# Patient Record
Sex: Male | Born: 1970 | Race: White | Hispanic: No | Marital: Married | Smoking: Former smoker
Health system: Southern US, Community
[De-identification: ages and names within clinical notes are randomized; demographics above are authoritative.]

## PROBLEM LIST (undated history)

## (undated) DIAGNOSIS — R5383 Other fatigue: Secondary | ICD-10-CM

## (undated) DIAGNOSIS — N451 Epididymitis: Secondary | ICD-10-CM

## (undated) DIAGNOSIS — M549 Dorsalgia, unspecified: Secondary | ICD-10-CM

## (undated) DIAGNOSIS — E785 Hyperlipidemia, unspecified: Secondary | ICD-10-CM

## (undated) DIAGNOSIS — IMO0001 Reserved for inherently not codable concepts without codable children: Secondary | ICD-10-CM

## (undated) DIAGNOSIS — J329 Chronic sinusitis, unspecified: Secondary | ICD-10-CM

## (undated) DIAGNOSIS — I739 Peripheral vascular disease, unspecified: Secondary | ICD-10-CM

## (undated) DIAGNOSIS — G894 Chronic pain syndrome: Secondary | ICD-10-CM

## (undated) DIAGNOSIS — J449 Chronic obstructive pulmonary disease, unspecified: Secondary | ICD-10-CM

## (undated) DIAGNOSIS — K625 Hemorrhage of anus and rectum: Secondary | ICD-10-CM

## (undated) DIAGNOSIS — R1030 Lower abdominal pain, unspecified: Secondary | ICD-10-CM

## (undated) DIAGNOSIS — D649 Anemia, unspecified: Secondary | ICD-10-CM

## (undated) DIAGNOSIS — F17200 Nicotine dependence, unspecified, uncomplicated: Secondary | ICD-10-CM

## (undated) DIAGNOSIS — G8929 Other chronic pain: Secondary | ICD-10-CM

## (undated) DIAGNOSIS — K219 Gastro-esophageal reflux disease without esophagitis: Secondary | ICD-10-CM

## (undated) DIAGNOSIS — I509 Heart failure, unspecified: Secondary | ICD-10-CM

## (undated) DIAGNOSIS — M5416 Radiculopathy, lumbar region: Secondary | ICD-10-CM

## (undated) DIAGNOSIS — I639 Cerebral infarction, unspecified: Secondary | ICD-10-CM

## (undated) DIAGNOSIS — N433 Hydrocele, unspecified: Secondary | ICD-10-CM

## (undated) DIAGNOSIS — N50819 Testicular pain, unspecified: Secondary | ICD-10-CM

## (undated) DIAGNOSIS — N529 Male erectile dysfunction, unspecified: Secondary | ICD-10-CM

## (undated) DIAGNOSIS — I2510 Atherosclerotic heart disease of native coronary artery without angina pectoris: Secondary | ICD-10-CM

## (undated) DIAGNOSIS — R1310 Dysphagia, unspecified: Secondary | ICD-10-CM

## (undated) DIAGNOSIS — E7800 Pure hypercholesterolemia, unspecified: Secondary | ICD-10-CM

## (undated) HISTORY — DX: Gastro-esophageal reflux disease without esophagitis: K21.9

## (undated) HISTORY — DX: Hyperlipidemia, unspecified: E78.5

## (undated) HISTORY — DX: Cerebral infarction, unspecified: I63.9

## (undated) HISTORY — DX: Male erectile dysfunction, unspecified: N52.9

## (undated) HISTORY — DX: Hydrocele, unspecified: N43.3

## (undated) HISTORY — DX: Chronic pain syndrome: G89.4

## (undated) HISTORY — DX: Nicotine dependence, unspecified, uncomplicated: F17.200

## (undated) HISTORY — DX: Testicular pain, unspecified: N50.819

## (undated) HISTORY — DX: Other chronic pain: G89.29

## (undated) HISTORY — DX: Dorsalgia, unspecified: M54.9

## (undated) HISTORY — DX: Other fatigue: R53.83

## (undated) HISTORY — DX: Epididymitis: N45.1

## (undated) HISTORY — DX: Chronic obstructive pulmonary disease, unspecified: J44.9

## (undated) HISTORY — DX: Lower abdominal pain, unspecified: R10.30

## (undated) HISTORY — PX: APPENDECTOMY: SHX54

## (undated) HISTORY — DX: Chronic sinusitis, unspecified: J32.9

## (undated) NOTE — *Deleted (*Deleted)
Manuel Richard ZOX:[id number] DOB: 01/21/1971 DOA: 04/17/2020  PCP: Grayland Jack, FNP   Patient coming from: ***  I have personally briefly reviewed patient's old medical records in [facility]  Chief Complaint: ***  HPI: CADEL STAIRS is a 78 y.o. male with medical history significant of   Review of Systems: A  Past Medical History:  Diagnosis Date  . Anemia   . CHF (congestive heart failure) (HCC)   . Chronic back pain   . Chronic pain syndrome   . Chronic pain syndrome   . Chronic sinusitis   . COPD (chronic obstructive pulmonary disease) (HCC)   . Coronary artery disease   . Dyslipidemia   . Dysphagia   . Epididymitis    Chlamydial, Right  . Erectile dysfunction   . Fatigue   . GERD (gastroesophageal reflux disease)   . Groin pain   . High cholesterol   . Hydrocele    bilateral  . Lumbar radiculopathy   . Nicotine addiction   . Peripheral vascular disease (HCC)   . Rectal bleeding   . Shortness of breath dyspnea   . Stroke (HCC) 2006   x 2     . Testicular pain     Past Surgical History:  Procedure Laterality Date  . APPENDECTOMY     Social History  reports that he has quit smoking. He smoked 0.00 packs per day for 31.00 years. He has never used smokeless tobacco. He reports that he does not drink alcohol and does not use drugs.  No Known Allergies  Family History  Problem Relation Age of Onset  . Cancer Mother   . Cancer Father   . Colon cancer Neg Hx   . Colon polyps Neg Hx    Prior to Admission medications   Medication Sig Start Date End Date Taking? Authorizing Provider  albuterol (PROVENTIL HFA;VENTOLIN HFA) 108 (90 Base) MCG/ACT inhaler Inhale 2 puffs into the lungs every 6 (six) hours as needed for wheezing or shortness of breath.   Yes [provider]  aspirin EC 81 MG tablet Take 81 mg by mouth daily.   Yes [provider]  atorvastatin (LIPITOR) 40 MG tablet Take 40 mg by mouth daily at 6 PM.  08/16/18   Yes [provider]  buPROPion (WELLBUTRIN XL) 150 MG 24 hr tablet Take 150 mg by mouth daily.   Yes [provider]  fenofibrate (TRICOR) 145 MG tablet Take 145 mg by mouth daily. 01/23/20  Yes [provider]  fluticasone (FLONASE) 50 MCG/ACT nasal spray Place 2 sprays into both nostrils daily. 02/09/20  Yes [provider]  furosemide (LASIX) 20 MG tablet Take 20 mg by mouth daily.  12/24/17  Yes [provider]  HYDROcodone-acetaminophen (NORCO) 10-325 MG tablet Take 1 tablet by mouth every 4 (four) hours as needed for pain. 07/02/19  Yes [provider]  ibuprofen (ADVIL) 800 MG tablet Take 800 mg by mouth 3 (three) times daily. 03/24/20  Yes [provider]  ipratropium-albuterol (DUONEB) 0.5-2.5 (3) MG/3ML SOLN Take 3 mLs by nebulization 3 (three) times daily.   Yes [provider]  LINZESS 290 MCG CAPS capsule Take 290 mcg by mouth daily. 05/04/19  Yes [provider]  methylPREDNISolone (MEDROL DOSEPAK) 4 MG TBPK tablet Take by mouth. 03/24/20  Yes [provider]  omeprazole (PRILOSEC) 40 MG capsule Take 40 mg by mouth daily. 01/11/20  Yes [provider]  sildenafil (REVATIO) 20 MG tablet  Take 5 tablets by mouth as needed. 02/22/18  Yes [provider]  STIOLTO RESPIMAT 2.5-2.5 MCG/ACT AERS Inhale 2 puffs into the lungs daily. 07/05/18  Yes [provider]  ezetimibe (ZETIA) 10 MG tablet Take 10 mg by mouth at bedtime. 03/17/19   [provider]  Fluticasone-Salmeterol (ADVAIR DISKUS) 250-50 MCG/DOSE AEPB Inhale 1 puff into the lungs in the morning and at bedtime. Patient not taking: Reported on April 23, 2020 10/24/19   Burgess Amor, PA-C  venlafaxine XR (EFFEXOR-XR) 75 MG 24 hr capsule Take 75 mg by mouth daily. 03/08/20   [provider]    Physical Exam: Vitals:   03/15/2020 2057 03/08/2020 2100 03/14/2020 2110 03/02/2020 2200  BP: (!) 81/58 (!) 80/48  (!) 111/53   Pulse:  (!) 115 (!) 115 (!) 109  Resp:  16 (!) 21 17  Temp:   (!) 96.6 F (35.9 C)   TempSrc:      SpO2:  97% 96% 94%  Weight:      Height:        Constitutional: NAD, calm, comfortable Eyes: PERRL, lids and conjunctivae normal ENMT: Mucous membranes are moist. Posterior pharynx clear of any exudate or lesions.Normal dentition.  Neck: normal, supple, no masses, no thyromegaly Respiratory: clear to auscultation bilaterally, no wheezing, no crackles. Normal respiratory effort. No accessory muscle use.  Cardiovascular: Regular rate and rhythm, no murmurs / rubs / gallops. No extremity edema. 2+ pedal pulses. No carotid bruits.  Abdomen: no tenderness, no masses palpated. No hepatosplenomegaly. Bowel sounds positive.  Musculoskeletal: no clubbing / cyanosis. No joint deformity upper and lower extremities. Good ROM, no contractures. Normal muscle tone.  Skin: no rashes, lesions, ulcers. No induration Neurologic: CN 2-12 grossly intact. Sensation intact, DTR normal. Strength 5/5 in all 4.  Psychiatric: Normal judgment and insight. Alert and oriented x 3. Normal mood.             CBC: Recent Labs  Lab 04/23/2020 0725 03/16/2020 0646  WBC 18.7* 15.8*  NEUTROABS 15.8* 13.1*  HGB 11.2* 12.9*  HCT 32.9* 39.2  MCV 94.3 93.8  PLT 224 260    Basic Metabolic Panel: Recent Labs  Lab 04/23/2020 0725 02/29/2020 0646  NA 136 135  K 2.8* 3.2*  CL 98 97*  CO2 27 22  GLUCOSE 137* 264*  BUN 25* 33*  CREATININE 0.60* 1.12  CALCIUM 8.5* 9.0  MG 1.5* 2.0   GFR: Estimated Creatinine Clearance: 100.2 mL/min (by C-G formula based on SCr of 1.12 mg/dL).  Liver Function Tests: Recent Labs  Lab 04-23-2020 0725 03/20/2020 0646  AST 14* 16  ALT 25 24  ALKPHOS 114 103  BILITOT 0.7 0.9  PROT 6.0* 6.0*  ALBUMIN 2.9* 2.9*   Radiological Exams on Admission: CT ABDOMEN PELVIS WO CONTRAST  Result Date: 03/21/2020 CLINICAL DATA:  Abdominal distension. Concern for obstruction. Left lower  quadrant tenderness and guarding. EXAM: CT ABDOMEN AND PELVIS WITHOUT CONTRAST TECHNIQUE: Multidetector CT imaging of the abdomen and pelvis was performed following the standard protocol without IV contrast. COMPARISON:  June 06, 2018 FINDINGS: Lower chest: There is atelectasis at versus consolidation at the lung bases, not well evaluated secondary to extensive respiratory motion artifact.The heart size is normal. Hepatobiliary: The liver is normal. The gallbladder is mildly distended. There is mild gallbladder wall thickening.There is no biliary ductal dilation. Pancreas: Normal contours without ductal dilatation. No peripancreatic fluid collection. Spleen: Unremarkable. Adrenals/Urinary Tract: --Adrenal glands: Unremarkable. --Right kidney/ureter: No hydronephrosis or radiopaque kidney stones. --Left kidney/ureter:  There are punctate nonobstructing stones in the left kidney. --Urinary bladder: Unremarkable. Stomach/Bowel: --Stomach/Duodenum: There is a questionable defect in the wall of the greater curvature of the stomach, not well evaluated secondary to respiratory motion artifact (axial series 2, image 26). --Small bowel: Unremarkable. --Colon: There are scattered colonic diverticula without CT evidence for diverticulitis. There is questionable diffuse circumferential wall thickening throughout the transverse colon. Alternatively, this may be secondary to underdistention. --Appendix: Not visualized. No right lower quadrant inflammation or free fluid. Vascular/Lymphatic: Atherosclerotic calcification is present within the non-aneurysmal abdominal aorta, without hemodynamically significant stenosis. --No retroperitoneal lymphadenopathy. --No mesenteric lymphadenopathy. --No pelvic or inguinal lymphadenopathy. Reproductive: Unremarkable Other: There is a small to moderate volume of ascites. There is a small volume free air in the upper abdomen. There are bilateral fat containing inguinal hernias.  Musculoskeletal. No acute displaced fractures. IMPRESSION: 1. Examination is significantly limited by respiratory motion artifact and lack of IV contrast. 2. Small volume free air in the upper abdomen, consistent with a perforated viscus in the absence of recent surgery. The exact site of perforation is not clearly identified on this exam, however there is a questionable defect in the wall of the greater curvature of the stomach, not well evaluated secondary to respiratory motion artifact. 3. Questionable diffuse circumferential wall thickening throughout the transverse colon. Alternatively, this may be secondary to underdistention. Correlation for signs and symptoms of colitis is recommended. 4. Nonobstructive left nephrolithiasis. 5. Atelectasis at versus consolidation at the lung bases, not well evaluated secondary to extensive respiratory motion artifact. 6. Mild gallbladder wall thickening, likely reactive. If there is high clinical suspicion for a right upper quadrant process, follow-up with ultrasound is recommended. Aortic Atherosclerosis (ICD10-I70.0). These results were called by telephone at the time of interpretation on 02/29/2020 at 3:39 pm to provider [facility] , who verbally acknowledged these results. Electronically Signed   By: Katherine Mantle M.D.   On: 03/15/2020 15:39   DG Chest 2 View  Result Date: 04/18/20 CLINICAL DATA:  Shortness of breath EXAM: CHEST - 2 VIEW COMPARISON:  03/10/2020 FINDINGS: Chronic interstitial prominence. Minimal basilar atelectasis/scarring. No pleural effusion. No pneumothorax. Stable cardiomediastinal contours. IMPRESSION: Stable chronic interstitial changes. Minimal basilar atelectasis/scarring. Electronically Signed   By: Guadlupe Spanish M.D.   On: 04-18-20 07:54   CT Angio Chest PE W and/or Wo Contrast  Result Date: 04/18/20 CLINICAL DATA:  Shortness of breath and productive cough. EXAM: CT ANGIOGRAPHY CHEST WITH CONTRAST TECHNIQUE:  Multidetector CT imaging of the chest was performed using the standard protocol during bolus administration of intravenous contrast. Multiplanar CT image reconstructions and MIPs were obtained to evaluate the vascular anatomy. CONTRAST:  75mL OMNIPAQUE IOHEXOL 350 MG/ML SOLN COMPARISON:  Chest x-ray 18-Apr-2020. CT scan of the chest Oct 17, 2018. FINDINGS: Cardiovascular: Coronary artery calcifications are identified, particularly in the left coronary arteries. The heart size is unchanged. The thoracic aorta demonstrates no aneurysm, dissection, or significant atherosclerotic change. Evaluation of the pulmonary arteries for emboli is significantly limited due to respiratory motion. In fact, the study is nondiagnostic beyond the level of the main pulmonary arteries. Within the severe limitations, no emboli definitively seen. Mediastinum/Nodes: No enlarged mediastinal, hilar, or axillary lymph nodes. Thyroid gland, trachea, and esophagus demonstrate no significant findings. Lungs/Pleura: Evaluation is significantly limited due to severe respiratory motion. Central airways are unremarkable other than some mucus in the right lower lobe bronchus. Mild paraseptal emphysema the right apex, unchanged. No pneumothorax identified. No definite focal infiltrate, nodule,  or mass. Upper Abdomen: Bilateral adrenal nodularity is stable, likely adenomas. Musculoskeletal: Focal kyphosis is seen in the upper thoracic spine at T4-5, new since Oct 17, 2018 but otherwise age indeterminate. Severe loss of height is associated with both of these vertebral bodies consistent with age-indeterminate compression fractures. Review of the MIP images confirms the above findings. IMPRESSION: 1. Evaluation for pulmonary emboli is severely limited due to significant respiratory motion. The study is nondiagnostic beyond the level of the main pulmonary arteries. No emboli seen in the main pulmonary arteries. Repeat imaging could be performed in  the patient is better able to cooperate. 2. Focal kyphosis is seen at T4-5, new since Oct 17, 2018 but otherwise age indeterminate. Severe loss of height of these vertebral bodies is consistent with age-indeterminate compression fractures. Recommend clinical correlation. 3. Coronary artery calcified atherosclerosis, particularly in the left coronary arteries. 4. Evaluation of the lungs is severely limited due to respiratory motion without focal infiltrate, nodule, or mass identified. Electronically Signed   By: Gerome Sam III M.D   On: 04-11-2020 09:23   DG CHEST PORT 1 VIEW  Result Date: 03/06/2020 CLINICAL DATA:  Respiratory failure EXAM: PORTABLE CHEST 1 VIEW COMPARISON:  2020-04-11 FINDINGS: The cardiomediastinal silhouette is stable. No pneumothorax. The lungs remain clear. No other acute abnormalities. IMPRESSION: No active disease. Electronically Signed   By: Gerome Sam III M.D   On: 03/15/2020 09:17   DG Abd Portable 1V  Result Date: 03/21/2020 CLINICAL DATA:  Shortness of breath and productive cough. EXAM: PORTABLE ABDOMEN - 1 VIEW COMPARISON:  None. FINDINGS: The bowel gas pattern is normal. No radio-opaque calculi or other significant radiographic abnormality are seen. IMPRESSION: Negative. Electronically Signed   By: Gerome Sam III M.D   On: 03/15/2020 09:17   [place] EKG SITE RITE  Result Date: 03/03/2020 If Site Rite image not attached, placement could not be confirmed due to current cardiac rhythm.   EKG: Independently reviewed.   Assessment/Plan   Bobette Mo MD [facility]  How to contact the [facility] Attending or Consulting provider 7A - 7P or covering provider during after hours 7P -7A, for this patient?   1. Check the care team in [facility] and look for a) attending/consulting [facility] provider listed and b) the [facility] team listed 2. Log into [email or web address] and use [facility]'s universal password to access. If you do not have the password, please contact  the hospital operator. 3. Locate the [facility] provider you are looking for under [facility] and page to a number that you can be directly reached. 4. If you still have difficulty reaching the provider, please page the [facility] (Director on Call) for the Hospitalists listed on amion for assistance.  03/21/2020, 10:45 PM

---

## 2004-05-27 DIAGNOSIS — I639 Cerebral infarction, unspecified: Secondary | ICD-10-CM

## 2004-05-27 HISTORY — DX: Cerebral infarction, unspecified: I63.9

## 2010-11-12 ENCOUNTER — Ambulatory Visit: Payer: Self-pay | Admitting: Internal Medicine

## 2010-12-04 ENCOUNTER — Ambulatory Visit: Payer: Self-pay | Admitting: Internal Medicine

## 2010-12-05 ENCOUNTER — Ambulatory Visit: Payer: Self-pay | Admitting: Pain Medicine

## 2014-10-07 ENCOUNTER — Other Ambulatory Visit: Payer: Self-pay | Admitting: Obstetrics and Gynecology

## 2014-10-07 ENCOUNTER — Ambulatory Visit: Payer: Medicare Other | Attending: Obstetrics and Gynecology

## 2014-10-07 DIAGNOSIS — N451 Epididymitis: Secondary | ICD-10-CM

## 2014-10-10 ENCOUNTER — Inpatient Hospital Stay: Admission: RE | Admit: 2014-10-10 | Payer: Medicare Other | Source: Ambulatory Visit

## 2014-10-10 ENCOUNTER — Ambulatory Visit
Admission: RE | Admit: 2014-10-10 | Discharge: 2014-10-10 | Disposition: A | Discharge status: Home / Self Care | Payer: Medicare Other | Source: Ambulatory Visit | Attending: Obstetrics and Gynecology | Admitting: Obstetrics and Gynecology

## 2014-10-10 ENCOUNTER — Ambulatory Visit: Admission: RE | Admit: 2014-10-10 | Payer: Medicare Other | Source: Ambulatory Visit

## 2014-10-10 ENCOUNTER — Ambulatory Visit: Payer: Medicare Other

## 2014-10-10 DIAGNOSIS — N451 Epididymitis: Secondary | ICD-10-CM | POA: Diagnosis present

## 2014-10-10 DIAGNOSIS — N433 Hydrocele, unspecified: Secondary | ICD-10-CM | POA: Insufficient documentation

## 2014-10-12 ENCOUNTER — Other Ambulatory Visit: Payer: Self-pay | Admitting: Obstetrics and Gynecology

## 2014-10-12 DIAGNOSIS — R1030 Lower abdominal pain, unspecified: Secondary | ICD-10-CM

## 2014-10-20 ENCOUNTER — Ambulatory Visit: Admission: RE | Admit: 2014-10-20 | Payer: Medicare Other | Source: Ambulatory Visit

## 2014-11-22 ENCOUNTER — Encounter: Payer: Self-pay | Admitting: *Deleted

## 2014-11-29 ENCOUNTER — Ambulatory Visit (INDEPENDENT_AMBULATORY_CARE_PROVIDER_SITE_OTHER): Payer: Self-pay | Admitting: Urology

## 2014-11-29 ENCOUNTER — Encounter: Payer: Self-pay | Admitting: Urology

## 2014-11-29 VITALS — BP 104/75 | HR 94 | Resp 18 | Ht 72.0 in | Wt 239.3 lb

## 2014-11-29 DIAGNOSIS — R3129 Other microscopic hematuria: Secondary | ICD-10-CM | POA: Insufficient documentation

## 2014-11-29 DIAGNOSIS — N451 Epididymitis: Secondary | ICD-10-CM

## 2014-11-29 DIAGNOSIS — R312 Other microscopic hematuria: Secondary | ICD-10-CM

## 2014-11-29 LAB — URINALYSIS, COMPLETE
Bilirubin, UA: NEGATIVE
Glucose, UA: NEGATIVE
Leukocytes, UA: NEGATIVE
Nitrite, UA: NEGATIVE
Specific Gravity, UA: 1.025 (ref 1.005–1.030)
Urobilinogen, Ur: 1 mg/dL (ref 0.2–1.0)
pH, UA: 5.5 (ref 5.0–7.5)

## 2014-11-29 LAB — MICROSCOPIC EXAMINATION: Bacteria, UA: NONE SEEN

## 2014-11-29 MED ORDER — DOXYCYCLINE HYCLATE 100 MG PO CAPS: 100.0000 mg | ORAL_CAPSULE | Freq: Two times a day (BID) | ORAL | Status: DC

## 2014-11-29 MED ORDER — IBUPROFEN 600 MG PO TABS: 600.0000 mg | ORAL_TABLET | Freq: Three times a day (TID) | ORAL | Status: DC | PRN

## 2014-11-29 NOTE — Progress Notes (Signed)
11/29/2014 2:40 PM   Manuel Richard 09/14/1970 [id number]  Referring provider: Ashley Marinerhristina Marie Gonzalez, Richard [address] [place], [place] [id number]  Chief Complaint  Patient presents with  . Testicle Pain    [facility] Manuel EnsignMeredith Soles Richard    HPI: Manuel Richard is a 44 year old white male who is referred to us by Dr. Jayme CloudGonzalez for right testicular pain.  Patient presented to his primary care physician 2 weeks ago complaining of right testicular pain. He was prescribed an antibiotic for epididymitis. I have the records available for me at this appointment, but the patient believes it was Levaquin. He states after his visit he was called 3 times about his condition. The first time he was told he had epididymitis, the second time he was told he had an STD and the third time he stated he was told he had phlegm in his testicles.  He states his pain has been going on for about 3 months. It is in the right testicle radiating up to the right groin. He denies any infections or injury to the scrotal area. He denies any penile discharge, but he does admit to dysuria.  He states he is not been recently sexually active.  He also denies any fever, chills, nausea, nausea or gross hematuria. Denies any pain with ejaculation.    PMH: Past Medical History  Diagnosis Date  . Epididymitis     Chlamydial, Right  . Groin pain   . Chronic sinusitis   . Dyslipidemia   . Chronic pain syndrome   . Stroke 2006    x 2     . Fatigue   . Nicotine addiction   . Chronic back pain   . Erectile dysfunction   . Hydrocele     bilateral  . Testicular pain     Surgical History: Past Surgical History  Procedure Laterality Date  . Appendectomy      Home Medications:    Medication List       This list is accurate as of: 11/29/14  2:40 PM.  Always use your most recent med list.               aspirin EC 81 MG tablet  Take 81 mg by mouth daily.     atorvastatin 40 MG  tablet  Commonly known as:  LIPITOR  Take 40 mg by mouth daily.     fluticasone 50 MCG/ACT nasal spray  Commonly known as:  FLONASE  Place 2 sprays into both nostrils daily.     ibuprofen 600 MG tablet  Commonly known as:  ADVIL,MOTRIN  Take 600 mg by mouth every 8 (eight) hours as needed.     oxyCODONE-acetaminophen 5-325 MG per tablet  Commonly known as:  PERCOCET/ROXICET  Take 1 tablet by mouth every 4 (four) hours as needed for severe pain.     traMADol 50 MG tablet  Commonly known as:  ULTRAM  Take by mouth every 6 (six) hours as needed.     vardenafil 5 MG tablet  Commonly known as:  LEVITRA  Take 5 mg by mouth as needed for erectile dysfunction.     varenicline 1 MG tablet  Commonly known as:  CHANTIX  Take 1 mg by mouth 2 (two) times daily.        Allergies: No Known Allergies  Family History: Family History  Problem Relation Age of Onset  . Cancer Mother   . Cancer Father  Social History:  reports that he has been smoking Cigarettes.  He has been smoking about 1.50 packs per day. He does not have any smokeless tobacco history on file. He reports that he does not drink alcohol. His drug history is not on file.  ROS: Urological Symptom Review  Patient is experiencing the following symptoms: Frequent urination Hard to postpone urination Burning/pain with urination Get up at night to urinate Stream starts and stops Have to strain to urinate Weak stream   Review of Systems  Gastrointestinal (upper)  : Negative for upper GI symptoms  Gastrointestinal (lower) : Negative for lower GI symptoms  Constitutional : Negative for symptoms  Skin: Negative for skin symptoms  Eyes: Negative for eye symptoms  Ear/Nose/Throat : Negative for Ear/Nose/Throat symptoms  Hematologic/Lymphatic: Negative for Hematologic/Lymphatic symptoms  Cardiovascular : Negative for cardiovascular symptoms  Respiratory : Negative for respiratory  symptoms  Endocrine: Negative for endocrine symptoms  Musculoskeletal: Negative for musculoskeletal symptoms  Neurological: Negative for neurological symptoms  Psychologic: Negative for psychiatric symptoms   Physical Exam: BP 104/75 mmHg  Pulse 94  Resp 18  Ht 6' (1.829 m)  Wt 239 lb 4.8 oz (108.546 kg)  BMI 32.45 kg/m2  Constitutional:  Alert and oriented, No acute distress. HEENT: [place] AT, moist mucus membranes.  Trachea midline, no masses. Cardiovascular: No clubbing, cyanosis, or edema. Respiratory: Normal respiratory effort, no increased work of breathing. GI: Abdomen is soft, nontender, nondistended, no abdominal masses GU: No CVA tenderness. GU: Patient with circumcised phallus.  Urethral meatus is patent.  No penile discharge. No penile lesions or rashes. Scrotum without lesions, cysts, rashes and/or edema.  Testicles are located scrotally bilaterally. No masses are appreciated in the testicles. Left epididymis is normal.  Right epididymis is tender and indurated. Rectal: Patient with  normal sphincter tone. Perineum without scarring or rashes. No rectal masses are appreciated. Prostate is approximately 45 grams, tender, not boggy, no nodules are appreciated. Seminal vesicles are normal. Skin: No rashes, bruises or suspicious lesions. Lymph: No cervical or inguinal adenopathy. Neurologic: Grossly intact, no focal deficits, moving all 4 extremities. Psychiatric: Normal mood and affect.  Laboratory Data: Results for orders placed or performed in visit on 11/29/14  Microscopic Examination  Result Value Ref Range   WBC, UA 6-10 (A) 0 -  5 /hpf   RBC, UA 3-10 (A) 0 -  2 /hpf   Epithelial Cells (non renal) 0-10 0 - 10 /hpf   Bacteria, UA None seen None seen/Few  Urinalysis, Complete  Result Value Ref Range   Specific Gravity, UA 1.025 1.005 - 1.030   pH, UA 5.5 5.0 - 7.5   Color, UA Yellow Yellow   Appearance Ur Clear Clear   Leukocytes, UA Negative Negative    Protein, UA Trace (A) Negative/Trace   Glucose, UA Negative Negative   Ketones, UA Trace (A) Negative   RBC, UA Trace (A) Negative   Bilirubin, UA Negative Negative   Urobilinogen, Ur 1.0 0.2 - 1.0 mg/dL   Nitrite, UA Negative Negative   Microscopic Examination See below:    No results found for: WBC, HGB, HCT, MCV, PLT  No results found for: CREATININE  No results found for: PSA  No results found for: TESTOSTERONE  No results found for: HGBA1C  Urinalysis No results found for: COLORURINE, APPEARANCEUR, LABSPEC, PHURINE, GLUCOSEU, HGBUR, BILIRUBINUR, KETONESUR, PROTEINUR, UROBILINOGEN, NITRITE, LEUKOCYTESUR  Pertinent Imaging: CLINICAL DATA: Right scrotal pain. Epididymitis. Symptoms for 5 days.  EXAM: SCROTAL ULTRASOUND  DOPPLER ULTRASOUND  OF THE TESTICLES  TECHNIQUE: Complete ultrasound examination of the testicles, epididymis, and other scrotal structures was performed. Color and spectral Doppler ultrasound were also utilized to evaluate blood flow to the testicles.  COMPARISON: None.  FINDINGS: Right testicle  Measurements: 4.2 x 4.7 x 4.0 cm. No mass or microlithiasis visualized.  Left testicle  Measurements: 4.9 x 3.3 x 4.2 cm. No mass or microlithiasis visualized.  Right epididymis: 2 small cysts are identified. The larger measures 0.7 cm in diameter. The right epididymis appears mildly hyperemic and swollen compared to the left.  Left epididymis: Single small cyst measuring 0.5 cm in diameter is identified.  Hydrocele: Small to moderate bilateral hydroceles are seen.  Varicocele: None visualized.  Pulsed Doppler interrogation of both testes demonstrates normal low resistance arterial and venous waveforms bilaterally.  IMPRESSION: Mild hyperemia and swelling of the right epididymis is compatible with epididymitis.  Small to moderate bilateral hydroceles.   Electronically Signed  By: Drusilla Kanner M.D.  On:  10/10/2014 10:05  Assessment & Plan:    1. Epididymitis:   Patient appears started on doxycycline. His urine is sent for GC and chlamydia cultures and urine cultures. He is not wearing any underwear at the time of exam. I advised him to wear briefs for scrotal support.  - Urinalysis, Complete - CULTURE, URINE COMPREHENSIVE  2. Microscopic hematuria:   UA is suspicious for infection.  I will send it for Cx.  We will recheck the UA upon his return in 2 weeks time to make sure micro heme resolves.     No Follow-up on file.  Michiel Cowboy, PA-C  [facility] [address], [address] [place], [place] [address] [phone]

## 2014-11-30 LAB — GC/CHLAMYDIA PROBE AMP
Chlamydia trachomatis, NAA: NEGATIVE
Neisseria gonorrhoeae by PCR: NEGATIVE

## 2014-12-01 LAB — CULTURE, URINE COMPREHENSIVE

## 2014-12-16 ENCOUNTER — Encounter: Payer: Self-pay | Admitting: Urology

## 2014-12-16 ENCOUNTER — Ambulatory Visit (INDEPENDENT_AMBULATORY_CARE_PROVIDER_SITE_OTHER): Payer: Medicare Other | Admitting: Urology

## 2014-12-16 VITALS — BP 118/81 | HR 90 | Resp 18 | Ht 72.0 in | Wt 237.4 lb

## 2014-12-16 DIAGNOSIS — F1910 Other psychoactive substance abuse, uncomplicated: Secondary | ICD-10-CM

## 2014-12-16 DIAGNOSIS — N453 Epididymo-orchitis: Secondary | ICD-10-CM

## 2014-12-16 DIAGNOSIS — R312 Other microscopic hematuria: Secondary | ICD-10-CM

## 2014-12-16 DIAGNOSIS — Z765 Malingerer [conscious simulation]: Secondary | ICD-10-CM

## 2014-12-16 DIAGNOSIS — R3129 Other microscopic hematuria: Secondary | ICD-10-CM

## 2014-12-16 LAB — URINALYSIS, COMPLETE
Bilirubin, UA: NEGATIVE
Glucose, UA: NEGATIVE
Ketones, UA: NEGATIVE
Nitrite, UA: NEGATIVE
Protein, UA: NEGATIVE
Specific Gravity, UA: 1.025 (ref 1.005–1.030)
Urobilinogen, Ur: 0.2 mg/dL (ref 0.2–1.0)
pH, UA: 5.5 (ref 5.0–7.5)

## 2014-12-16 LAB — MICROSCOPIC EXAMINATION
Bacteria, UA: NONE SEEN
Epithelial Cells (non renal): NONE SEEN /hpf (ref 0–10)

## 2014-12-16 LAB — BLADDER SCAN AMB NON-IMAGING

## 2014-12-16 MED ORDER — IBUPROFEN 600 MG PO TABS: 600.0000 mg | ORAL_TABLET | Freq: Three times a day (TID) | ORAL | Status: DC | PRN

## 2014-12-16 MED ORDER — CIPROFLOXACIN HCL 500 MG PO TABS: 500.0000 mg | ORAL_TABLET | Freq: Two times a day (BID) | ORAL | Status: DC

## 2014-12-16 NOTE — Progress Notes (Signed)
12/16/2014 10:47 AM   Manuel Richard Nov 08, 1970 [id number]  Referring provider: Ashley Mariner, MD [address] [place], [place] [address]  Chief Complaint  Patient presents with  . Follow-up    2 wk  . Epididymitis  . Hematuria    HPI: Manuel Richard is a 44 year old white male who was referred to [place] by his PCP for right testicular pain two weeks ago.  At that visit, his right epididymis was tender and he was prescribed doxycycline.  His UCx and GC/Chlamydia cultures were negative.  He was not wearing underwear at the time of the exam 2 weeks ago and advised him to wear briefs for scrotal support.  Today, he states the pain is worse. He states he felt no improvement after the doxycycline. The ibuprofen was not effective in controlling his pain and he is requesting stronger pain medication.    He has not had any fevers, chills, nausea, or vomiting. He also denies any gross hematuria, dysuria or suprapubic pain. He does state his urine just runs out.  His PVR today was 0 mL.  At his last appointment 2 weeks ago, he was found to have microscopic hematuria. Today's, UA is negative for microscopic    hematuria.   PMH: Past Medical History  Diagnosis Date  . Epididymitis     Chlamydial, Right  . Groin pain   . Chronic sinusitis   . Dyslipidemia   . Chronic pain syndrome   . Stroke 2006    x 2     . Fatigue   . Nicotine addiction   . Chronic back pain   . Erectile dysfunction   . Hydrocele     bilateral  . Testicular pain     Surgical History: Past Surgical History  Procedure Laterality Date  . Appendectomy      Home Medications:    Medication List       This list is accurate as of: 12/16/14 10:47 AM.  Always use your most recent med list.               aspirin EC 81 MG tablet  Take 81 mg by mouth daily.     atorvastatin 40 MG tablet  Commonly known as:  LIPITOR  Take 40 mg by mouth daily.     fluticasone 50 MCG/ACT nasal spray  Commonly known as:   FLONASE  Place 2 sprays into both nostrils daily.     ibuprofen 600 MG tablet  Commonly known as:  ADVIL,MOTRIN  Take 1 tablet (600 mg total) by mouth every 8 (eight) hours as needed.     vardenafil 5 MG tablet  Commonly known as:  LEVITRA  Take 5 mg by mouth as needed for erectile dysfunction.     varenicline 1 MG tablet  Commonly known as:  CHANTIX  Take 1 mg by mouth 2 (two) times daily.        Allergies: No Known Allergies  Family History: Family History  Problem Relation Age of Onset  . Cancer Mother   . Cancer Father     Social History:  reports that he has been smoking Cigarettes.  He has been smoking about 1.50 packs per day. He does not have any smokeless tobacco history on file. He reports that he does not drink alcohol. His drug history is not on file.  ROS: UROLOGY Frequent Urination?: No Hard to postpone urination?: Yes Burning/pain with urination?: No Get up at night to urinate?: Yes  Leakage of urine?: No Urine stream starts and stops?: Yes Trouble starting stream?: No Do you have to strain to urinate?: No Blood in urine?: No Urinary tract infection?: No Sexually transmitted disease?: No Injury to kidneys or bladder?: No Painful intercourse?: No Weak stream?: No Erection problems?: Yes Penile pain?: Yes  Gastrointestinal Nausea?: No Vomiting?: No Indigestion/heartburn?: No Diarrhea?: No Constipation?: No  Constitutional Fever: No Night sweats?: No Weight loss?: No Fatigue?: No  Skin Skin rash/lesions?: No Itching?: No  Eyes Blurred vision?: No Double vision?: No  Ears/Nose/Throat Sore throat?: No Sinus problems?: No  Hematologic/Lymphatic Swollen glands?: No Easy bruising?: No  Cardiovascular Leg swelling?: No Chest pain?: No  Respiratory Cough?: No Shortness of breath?: No  Endocrine Excessive thirst?: No  Musculoskeletal Back pain?: No Joint pain?: No  Neurological Headaches?: No Dizziness?:  No  Psychologic Depression?: No Anxiety?: No  Physical Exam: BP 118/81 mmHg  Pulse 90  Resp 18  Ht 6' (1.829 m)  Wt 237 lb 6.4 oz (107.684 kg)  BMI 32.19 kg/m2  GU: No CVA tenderness. GU: Patient with circumcised phallus. Urethral meatus is patent. No penile discharge. No penile lesions or rashes. Scrotum without lesions, cysts, rashes and/or edema. Testicles are located scrotally bilaterally. No masses are appreciated in the testicles. Left epididymis is normal. Right epididymis is tender. Rectal: Patient with normal sphincter tone. Perineum without scarring or rashes. No rectal masses are appreciated. Prostate is approximately 45 grams, tender, not boggy, no nodules are appreciated. Seminal vesicles are normal.  Laboratory Data: Results for orders placed or performed in visit on 12/16/14  Microscopic Examination  Result Value Ref Range   WBC, UA 0-5 0 -  5 /hpf   RBC, UA 0-2 0 -  2 /hpf   Epithelial Cells (non renal) None seen 0 - 10 /hpf   Mucus, UA Present (A) Not Estab.   Bacteria, UA None seen None seen/Few  Urinalysis, Complete  Result Value Ref Range   Specific Gravity, UA 1.025 1.005 - 1.030   pH, UA 5.5 5.0 - 7.5   Color, UA Yellow Yellow   Appearance Ur Clear Clear   Leukocytes, UA 1+ (A) Negative   Protein, UA Negative Negative/Trace   Glucose, UA Negative Negative   Ketones, UA Negative Negative   RBC, UA Trace (A) Negative   Bilirubin, UA Negative Negative   Urobilinogen, Ur 0.2 0.2 - 1.0 mg/dL   Nitrite, UA Negative Negative   Microscopic Examination See below:   BLADDER SCAN AMB NON-IMAGING  Result Value Ref Range   Scan Result 0ml      No results found for: WBC, HGB, HCT, MCV, PLT  No results found for: CREATININE  No results found for: PSA  No results found for: TESTOSTERONE  No results found for: HGBA1C  Urinalysis    Component Value Date/Time   GLUCOSEU Negative 11/29/2014 1440   BILIRUBINUR Negative 11/29/2014 1440   NITRITE  Negative 11/29/2014 1440   LEUKOCYTESUR Negative 11/29/2014 1440    Pertinent Imaging:   Assessment & Plan:    1. Epididymitis: Patient is still experiencing pain in his right epididymis. I prescribed Cipro 500 mg twice daily for 30 days and is sent a prescription to his pharmacy. I'll also repeat the scrotal ultrasound.  - Urinalysis, Complete - CULTURE, URINE COMPREHENSIVE  2. Microscopic hematuria: UA today was negative for microscopic hematuria.   We will check another UA at his scrotal ultrasound result appointment.   3.  Drug-seeking behavior:   Patient's  pain was disproportion to the exam.  During the exam, the patient was moving cautious as a person would in extreme pain.  After the visit, he had no difficulty rising from his chair to exit the room.    I observed him walking to his car and he had no signs of discomfort.    - Urinalysis, Complete - BLADDER SCAN AMB NON-IMAGING   No Follow-up on file.  Michiel Cowboy, PA-C  [facility] [address] [place], [place] [address] [phone]

## 2014-12-29 ENCOUNTER — Ambulatory Visit
Admission: RE | Admit: 2014-12-29 | Discharge: 2014-12-29 | Disposition: A | Discharge status: Home / Self Care | Payer: Medicare Other | Source: Ambulatory Visit | Attending: Urology | Admitting: Urology

## 2014-12-29 DIAGNOSIS — N503 Cyst of epididymis: Secondary | ICD-10-CM | POA: Diagnosis not present

## 2014-12-29 DIAGNOSIS — N453 Epididymo-orchitis: Secondary | ICD-10-CM

## 2014-12-29 DIAGNOSIS — N433 Hydrocele, unspecified: Secondary | ICD-10-CM | POA: Diagnosis not present

## 2014-12-29 DIAGNOSIS — N508 Other specified disorders of male genital organs: Secondary | ICD-10-CM | POA: Diagnosis present

## 2014-12-29 DIAGNOSIS — I861 Scrotal varices: Secondary | ICD-10-CM | POA: Insufficient documentation

## 2014-12-30 ENCOUNTER — Ambulatory Visit (INDEPENDENT_AMBULATORY_CARE_PROVIDER_SITE_OTHER): Payer: Medicare Other | Admitting: Urology

## 2014-12-30 ENCOUNTER — Encounter: Payer: Self-pay | Admitting: Urology

## 2014-12-30 VITALS — BP 106/73 | HR 82 | Ht 72.0 in | Wt 236.9 lb

## 2014-12-30 DIAGNOSIS — R3129 Other microscopic hematuria: Secondary | ICD-10-CM

## 2014-12-30 DIAGNOSIS — F1910 Other psychoactive substance abuse, uncomplicated: Secondary | ICD-10-CM

## 2014-12-30 DIAGNOSIS — I861 Scrotal varices: Secondary | ICD-10-CM | POA: Diagnosis not present

## 2014-12-30 DIAGNOSIS — R312 Other microscopic hematuria: Secondary | ICD-10-CM

## 2014-12-30 DIAGNOSIS — Z765 Malingerer [conscious simulation]: Secondary | ICD-10-CM

## 2014-12-30 DIAGNOSIS — N433 Hydrocele, unspecified: Secondary | ICD-10-CM | POA: Diagnosis not present

## 2014-12-30 LAB — URINALYSIS, COMPLETE
Bilirubin, UA: NEGATIVE
Glucose, UA: NEGATIVE
Ketones, UA: NEGATIVE
Nitrite, UA: NEGATIVE
Protein, UA: NEGATIVE
Specific Gravity, UA: <1.005 — ABNORMAL LOW (ref 1.005–1.030)
Urobilinogen, Ur: 0.2 mg/dL (ref 0.2–1.0)
pH, UA: 6 (ref 5.0–7.5)

## 2014-12-30 LAB — MICROSCOPIC EXAMINATION
Bacteria, UA: NONE SEEN
Epithelial Cells (non renal): NONE SEEN /hpf (ref 0–10)
Renal Epithel, UA: NONE SEEN /hpf

## 2014-12-30 NOTE — Progress Notes (Signed)
9:06 AM   Manuel Richard February 08, 1971 [id number]  Referring provider: Ashley Mariner, MD [address] [place], [place] [address]  Chief Complaint  Patient presents with  . Testicle Pain    f/u testicular ultrasound     HPI: Manuel Richard is a 44 year old white male who presents today for a scrotal ultrasound report.  Scrotal ultrasound was ordered after he was still experiencing scrotal pain after a 30 day course of Cipro.   His UCx and GC/Chlamydia cultures were negative.  He was not wearing underwear at the time of the exam 2 weeks ago and I advised him to wear briefs for scrotal support.  He is not wearing underwear again today.   Today, he states the pain is slightly diminished but still present.   He has not had any fevers, chills, nausea, or vomiting. He also denies any gross hematuria, dysuria or suprapubic pain.   Today's, UA is negative for microscopic hematuria.  His UA from his last visit on 12/16/2014 was negative for microhematuria.     PMH: Past Medical History  Diagnosis Date  . Epididymitis     Chlamydial, Right  . Groin pain   . Chronic sinusitis   . Dyslipidemia   . Chronic pain syndrome   . Stroke 2006    x 2     . Fatigue   . Nicotine addiction   . Chronic back pain   . Erectile dysfunction   . Hydrocele     bilateral  . Testicular pain     Surgical History: Past Surgical History  Procedure Laterality Date  . Appendectomy      Home Medications:    Medication List       This list is accurate as of: 12/30/14  9:06 AM.  Always use your most recent med list.               aspirin EC 81 MG tablet  Take 81 mg by mouth daily.     atorvastatin 40 MG tablet  Commonly known as:  LIPITOR  Take 40 mg by mouth daily.     ciprofloxacin 500 MG tablet  Commonly known as:  CIPRO  Take 1 tablet (500 mg total) by mouth every 12 (twelve) hours.     ferrous sulfate 325 (65 FE) MG EC tablet     fluticasone 50 MCG/ACT nasal spray  Commonly  known as:  FLONASE  Place 2 sprays into both nostrils daily.     ibuprofen 600 MG tablet  Commonly known as:  ADVIL,MOTRIN  Take 1 tablet (600 mg total) by mouth every 8 (eight) hours as needed.     vardenafil 5 MG tablet  Commonly known as:  LEVITRA  Take 5 mg by mouth as needed for erectile dysfunction.     varenicline 1 MG tablet  Commonly known as:  CHANTIX  Take 1 mg by mouth 2 (two) times daily.     Vitamin D (Ergocalciferol) 50000 UNITS Caps capsule  Commonly known as:  DRISDOL  take 1 capsule by mouth every week for 8 WEEKS.        Allergies: No Known Allergies  Family History: Family History  Problem Relation Age of Onset  . Cancer Mother   . Cancer Father     Social History:  reports that he has been smoking Cigarettes.  He has been smoking about 1.50 packs per day. He does not have any smokeless tobacco history on file. He  reports that he does not drink alcohol or use illicit drugs.  ROS: UROLOGY Frequent Urination?: No Hard to postpone urination?: Yes Burning/pain with urination?: No Get up at night to urinate?: No Leakage of urine?: Yes Urine stream starts and stops?: Yes Trouble starting stream?: No Do you have to strain to urinate?: No Blood in urine?: No Urinary tract infection?: No Sexually transmitted disease?: No Injury to kidneys or bladder?: No Painful intercourse?: No Weak stream?: No Erection problems?: No Penile pain?: No  Gastrointestinal Nausea?: No Vomiting?: No Indigestion/heartburn?: No Diarrhea?: No Constipation?: No  Constitutional Fever: No Night sweats?: No Weight loss?: No Fatigue?: No  Skin Skin rash/lesions?: No Itching?: No  Eyes Blurred vision?: No Double vision?: No  Ears/Nose/Throat Sore throat?: No Sinus problems?: No  Hematologic/Lymphatic Swollen glands?: No Easy bruising?: No  Cardiovascular Leg swelling?: No Chest pain?: No  Respiratory Cough?: No Shortness of breath?:  No  Endocrine Excessive thirst?: No  Musculoskeletal Back pain?: No Joint pain?: No  Neurological Headaches?: No Dizziness?: No  Psychologic Depression?: No Anxiety?: No  Physical Exam: BP 106/73 mmHg  Pulse 82  Ht 6' (1.829 m)  Wt 236 lb 14.4 oz (107.457 kg)  BMI 32.12 kg/m2  GU: No CVA tenderness. GU: Patient with circumcised phallus. Urethral meatus is patent. No penile discharge. No penile lesions or rashes. Scrotum without lesions, cysts, rashes and/or edema. Testicles are located scrotally bilaterally. No masses are appreciated in the testicles. Right and left epididymitis are normal.  Laboratory Data: Results for orders placed or performed in visit on 12/30/14  Microscopic Examination  Result Value Ref Range   WBC, UA 0-5 0 -  5 /hpf   RBC, UA 0-2 0 -  2 /hpf   Epithelial Cells (non renal) None seen 0 - 10 /hpf   Renal Epithel, UA None seen None seen /hpf   Bacteria, UA None seen None seen/Few  Urinalysis, Complete  Result Value Ref Range   Specific Gravity, UA <1.005 (L) 1.005 - 1.030   pH, UA 6.0 5.0 - 7.5   Color, UA Yellow Yellow   Appearance Ur Clear Clear   Leukocytes, UA Trace (A) Negative   Protein, UA Negative Negative/Trace   Glucose, UA Negative Negative   Ketones, UA Negative Negative   RBC, UA Trace (A) Negative   Bilirubin, UA Negative Negative   Urobilinogen, Ur 0.2 0.2 - 1.0 mg/dL   Nitrite, UA Negative Negative   Microscopic Examination See below:    Results for orders placed or performed in visit on 12/16/14  Microscopic Examination  Result Value Ref Range   WBC, UA 0-5 0 -  5 /hpf   RBC, UA 0-2 0 -  2 /hpf   Epithelial Cells (non renal) None seen 0 - 10 /hpf   Mucus, UA Present (A) Not Estab.   Bacteria, UA None seen None seen/Few  Urinalysis, Complete  Result Value Ref Range   Specific Gravity, UA 1.025 1.005 - 1.030   pH, UA 5.5 5.0 - 7.5   Color, UA Yellow Yellow   Appearance Ur Clear Clear   Leukocytes, UA 1+ (A)  Negative   Protein, UA Negative Negative/Trace   Glucose, UA Negative Negative   Ketones, UA Negative Negative   RBC, UA Trace (A) Negative   Bilirubin, UA Negative Negative   Urobilinogen, Ur 0.2 0.2 - 1.0 mg/dL   Nitrite, UA Negative Negative   Microscopic Examination See below:   BLADDER SCAN AMB NON-IMAGING  Result Value Ref Range  Scan Result 0ml     Pertinent Imaging: CLINICAL DATA: Orchitis. Epididymitis. Right testicular pain.  EXAM: SCROTAL ULTRASOUND  DOPPLER ULTRASOUND OF THE TESTICLES  TECHNIQUE: Complete ultrasound examination of the testicles, epididymis, and other scrotal structures was performed. Color and spectral Doppler ultrasound were also utilized to evaluate blood flow to the testicles.  COMPARISON: None.  FINDINGS: Right testicle  Measurements: 5.2 x 3.1 x 4.1 cm. No mass or microlithiasis visualized.  Left testicle  Measurements: 5 x 3.0 x 3.9 cm. No mass or microlithiasis visualized.  Right epididymis: 1.5 x 1.0 x 2.1 cm. There are 2 cysts identified, the largest measures 6 mm.  Left epididymis: Measures 1.9 x 1.0 x 2.0 cm. Cyst measures 5 mm.  Hydrocele: Bilateral hydroceles identified.  Varicocele: There are bilateral varicoceles.  Pulsed Doppler interrogation of both testes demonstrates normal low resistance arterial and venous waveforms bilaterally.  IMPRESSION: 1. No evidence for testicular mass or torsion. 2. Bilateral hydroceles. 3. Epididymal cysts. 4. Varicoceles.   Electronically Signed  By: Signa Kell M.D.  On: 12/29/2014 16:23  Assessment & Plan:    1. Bilateral hydroceles:   Patient is encouraged to manage his hydroceles conservatively. I emphasized the importance of wearing supportive underwear and not engaging in activities that cause abdominal straining and to lose weight.  He is to return to our clinic if the hydroceles become larger or interfere with life activities.  2.  Bilateral varicoceles:   Patient to manage conservatively.   - Urinalysis, Complete  2. Microscopic hematuria: UA on 11/29/2014 contained 3-10 RBC's/hpf.  UA  was negative for microscopic hematuria on 12/16/2014 and today's exam.   He is to return to [place] if he should develop gross hematuria or found to have persisting microscopic hematuria by his primary care physician.  3.  Drug-seeking behavior:   At his visit on 12/16/2014, patient's pain was disproportion to the exam.  During the exam, the patient was moving cautious as a person would in extreme pain.  After the visit, he had no difficulty rising from his chair to exit the room.    I observed him walking to his car and he had no signs of discomfort.    - Urinalysis, Complete - BLADDER SCAN AMB NON-IMAGING   No Follow-up on file.  Michiel Cowboy, PA-C  [facility] [address], [address] [place], [place] [address] [phone]

## 2015-01-12 ENCOUNTER — Encounter: Payer: Self-pay | Admitting: Urology

## 2015-01-12 ENCOUNTER — Other Ambulatory Visit: Payer: Self-pay | Admitting: Urology

## 2015-01-12 DIAGNOSIS — Z765 Malingerer [conscious simulation]: Secondary | ICD-10-CM | POA: Insufficient documentation

## 2015-01-12 DIAGNOSIS — I861 Scrotal varices: Secondary | ICD-10-CM | POA: Insufficient documentation

## 2015-01-12 DIAGNOSIS — N433 Hydrocele, unspecified: Secondary | ICD-10-CM | POA: Insufficient documentation

## 2015-01-16 ENCOUNTER — Ambulatory Visit: Payer: Medicare Other | Attending: Anesthesiology | Admitting: Anesthesiology

## 2015-01-16 ENCOUNTER — Encounter: Payer: Self-pay | Admitting: Anesthesiology

## 2015-01-16 VITALS — BP 109/64 | HR 89 | Temp 97.6°F | Resp 18 | Ht 72.0 in | Wt 235.0 lb

## 2015-01-16 DIAGNOSIS — F17200 Nicotine dependence, unspecified, uncomplicated: Secondary | ICD-10-CM

## 2015-01-16 DIAGNOSIS — M5136 Other intervertebral disc degeneration, lumbar region: Secondary | ICD-10-CM | POA: Insufficient documentation

## 2015-01-16 DIAGNOSIS — N50819 Testicular pain, unspecified: Secondary | ICD-10-CM

## 2015-01-16 DIAGNOSIS — N521 Erectile dysfunction due to diseases classified elsewhere: Secondary | ICD-10-CM

## 2015-01-16 DIAGNOSIS — M545 Low back pain, unspecified: Secondary | ICD-10-CM

## 2015-01-16 DIAGNOSIS — N451 Epididymitis: Secondary | ICD-10-CM | POA: Diagnosis not present

## 2015-01-16 DIAGNOSIS — M8929 Other disorders of bone development and growth, multiple sites: Secondary | ICD-10-CM | POA: Diagnosis not present

## 2015-01-16 DIAGNOSIS — N452 Orchitis: Secondary | ICD-10-CM | POA: Diagnosis not present

## 2015-01-16 DIAGNOSIS — E7800 Pure hypercholesterolemia, unspecified: Secondary | ICD-10-CM

## 2015-01-16 DIAGNOSIS — M51369 Other intervertebral disc degeneration, lumbar region without mention of lumbar back pain or lower extremity pain: Secondary | ICD-10-CM

## 2015-01-16 DIAGNOSIS — M5416 Radiculopathy, lumbar region: Secondary | ICD-10-CM | POA: Diagnosis not present

## 2015-01-16 DIAGNOSIS — G8929 Other chronic pain: Secondary | ICD-10-CM | POA: Diagnosis present

## 2015-01-16 MED ORDER — MELOXICAM 15 MG PO TABS: 7.5000 mg | ORAL_TABLET | Freq: Two times a day (BID) | ORAL | Status: DC

## 2015-01-16 NOTE — Progress Notes (Signed)
Safety precautions to be maintained throughout the outpatient stay will include: orient to surroundings, keep bed in low position, maintain call bell within reach at all times, provide assistance with transfer out of bed and ambulation.  

## 2015-01-16 NOTE — Progress Notes (Signed)
Subjective:    Patient ID: Manuel Richard, male    DOB: 1970-06-08, 44 y.o.   MRN: [id number] This patient is a 44 year old gentleman who came in complaining of chronic low back pain and testicular pain. 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 He indicates that the pain radiates down his anterior thigh and posterior thigh into his left calf ending in his left foot. It does not affect his toes at all As a result of the pain he is not able to ambulate. His subjective pain intensity rating is 100% Patient is demonstrated by significant pain dramatization and pain exaggeration His secondary pain is bilateral testicular pain which he describes as greater than 100%. Patient indicates that the pain started approximately 6 months ago and it was spontaneous and was not associated with any trauma. He has seen grossly with hematuria and had as also experiencing rectal bleeding. Recent is currently being evaluated for: On all prostate cancer . Pain medications   patient does not take any pain medications at this time.  The patient indicates that when he was in [place] he was given 180 Percocet tablets which controlled his pain quite well.  Other medications Patient takes that he takes aspirin 81 mg Lipitor ferrous sulfate Flonase vitamin D ergocalciferol ciprofloxacin ibuprofen Ladona Horns for and Chantix  Allergies There are no known allergies  Past medical history Past medical history is positive for chronic low back pain  chronic testicular pain with erectile dysfunction and hematuria Or cholesterolemia and is currently being investigated for cancer of the colon and cancer of the prostate.  Past surgical history She indicated that he had an appendectomy at age 51   Social and economic history  The patient indicates that he smokes approximately 1-1/2 packs of cigarettes a day and has been doing that for over 28 years He does not use alcohol now does use illicit drugs. Patient indicates that he used to be employed as a Corporate investment banker working in Programmer, systems works He is currently on full disability and does not work again.   Family history Patient is married and has been married for 12 years and has 2 children ages 32 and 49 and both children are alive and well.  His wife is 42 years old is alive and has diabetes mellitus His mother is deceased at age 74 from the complications of cancer of the stomach His father is deceased at age 71 from the complications of cancer of the stomach   he has no brothers and no sisters        HPI    Review of Systems  Constitutional: Negative.   HENT: Negative.   Eyes: Negative.   Respiratory: Negative.   Cardiovascular: Negative.   Gastrointestinal: Negative for abdominal distention.       Patient has gross rectal bleeding and is currently being investigated for cancer of the colon  Endocrine: Negative.   Genitourinary: Positive for urgency, frequency,  hematuria, decreased urine volume, scrotal swelling, penile pain and testicular pain.       This patient has severe bilateral testicular pain with which she's had for the past 6 months. This pain is associated with gross and microscopic hematuria and also severe and discouraging erectile dysfunction. He is currently being investigated for carcinoma of the prostate adenocarcinoma of the colon  Allergic/Immunologic: Negative.   Neurological: Negative.   Hematological: Negative.    Psychiatric/Behavioral: Positive for behavioral problems.       Patient did demonstrate significant pain dramatization and pain exaggeration. He pointed out that his pain was in excess of 100%. Although he was not directly drug-seeking that his implication was that the only thing that would give him relief would be pain medications since they have helped him while he was in [place]. He refused all interventions indicating that he was afraid of needles all door he had a large body piercings in the tongue in his mouth       Objective:   Physical Exam  Constitutional: He appears well-developed and well-nourished. No distress.  HENT:  Head: Normocephalic and atraumatic.  Nose: Nose normal.  Mouth/Throat: Oropharynx is clear and moist. No oropharyngeal exudate.  . Patient had a large body piercings in the midline of his tongue which appeared to affect his speech  Eyes: Conjunctivae and EOM are normal. Pupils are equal, round, and reactive to light. Right eye exhibits no discharge. Left eye exhibits no discharge. No scleral icterus.  Neck: Normal range of motion. Neck supple. No JVD present. No tracheal deviation present. No thyromegaly present.  Cardiovascular: Normal rate, regular rhythm, normal heart sounds and intact distal pulses.   His blood pressure was 109/64 mmHg His pulse was 89 bpm equal and regular His respirations were 18 breaths per minute Heart sounds 1 and 2 were heard in all areas and there were no audible murmurs His SPO2 was 98%  Pulmonary/Chest: Effort normal and breath sounds normal. No stridor. No respiratory distress. He has no wheezes. He has no rales. He exhibits no tenderness.  Abdominal: Soft. Bowel sounds are normal. He exhibits no distension and no mass. There is no tenderness. There is no rebound and no guarding.  Genitourinary:  Genitourinary examination was deferred  Musculoskeletal: He exhibits tenderness. He exhibits no edema.  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  Neurological: He is alert. He has normal reflexes. He displays normal reflexes. No cranial nerve deficit. He exhibits abnormal muscle tone. Coordination normal.  Skin: Skin is warm and dry. No rash noted. He is not diaphoretic. No erythema. No pallor.  Psychiatric: He has a normal mood and affect. His behavior is normal. Judgment and thought content normal.  Nursing note and vitals reviewed.         Assessment & Plan:   Assessment 1 chronic low back pain 2 lumbar degenerative disc disease 3Lumbar radiculopathy  4 chronic testicular pain 5 Orchitis  6 Epididymitis 7 hypercholesterolemia 8 tobacco abuse   Plan of management I plan to perform a caudal epidural steroid injection for this gentleman to review order his imaging which was done after in another institution The plan would be after evaluating the condition of  his this as they affect the neural the neural foramina that I would plan possibly a left transforaminal epidural steroid injection at L3 L4-L5 The patient refused all interventions indicating that he was afraid of needles although he had a large body piercings in his tongue I therefore prescribe meloxicam 7.5 mg twice a day after meals and give him 42 tablets plan to follow up with him in 3 weeks   New patient  level III   Tod Persia M.D.

## 2015-01-16 NOTE — Patient Instructions (Signed)
A prescription for Meloxicam was sent to your pharmacy. 

## 2015-01-20 ENCOUNTER — Encounter: Payer: Self-pay | Admitting: Urology

## 2015-01-20 ENCOUNTER — Ambulatory Visit (INDEPENDENT_AMBULATORY_CARE_PROVIDER_SITE_OTHER): Payer: Medicare Other | Admitting: Urology

## 2015-01-20 VITALS — BP 118/78 | HR 84 | Resp 18 | Ht 72.0 in | Wt 231.5 lb

## 2015-01-20 DIAGNOSIS — I861 Scrotal varices: Secondary | ICD-10-CM

## 2015-01-20 DIAGNOSIS — R3129 Other microscopic hematuria: Secondary | ICD-10-CM

## 2015-01-20 DIAGNOSIS — N433 Hydrocele, unspecified: Secondary | ICD-10-CM

## 2015-01-20 DIAGNOSIS — R312 Other microscopic hematuria: Secondary | ICD-10-CM | POA: Diagnosis not present

## 2015-01-20 LAB — MICROSCOPIC EXAMINATION
Bacteria, UA: NONE SEEN
Epithelial Cells (non renal): NONE SEEN /hpf (ref 0–10)

## 2015-01-20 LAB — URINALYSIS, COMPLETE
Bilirubin, UA: POSITIVE — AB
Glucose, UA: NEGATIVE
Leukocytes, UA: NEGATIVE
Nitrite, UA: NEGATIVE
Protein, UA: NEGATIVE
RBC, UA: NEGATIVE
Specific Gravity, UA: >1.03 — ABNORMAL HIGH (ref 1.005–1.030)
Urobilinogen, Ur: 1 mg/dL (ref 0.2–1.0)
pH, UA: 5.5 (ref 5.0–7.5)

## 2015-01-26 ENCOUNTER — Other Ambulatory Visit: Payer: Self-pay | Admitting: Anesthesiology

## 2015-01-28 NOTE — Progress Notes (Signed)
12:44 PM   Manuel Richard [birth year] [id number]  Referring provider: Ashley Mariner, MD [address] [place], [place] [address]  Chief Complaint  Patient presents with  . Follow-up    2 week.  . Hydrocele  . Hematuria    HPI: Patient is a 44 year old white male who was found to have microscopic hematuria, bilateral hydroceles and bilateral varicoceles on scrotal ultrasound who presents today for recheck of his urinalysis and scrotal symptoms.  Patient states he has not had any gross hematuria.  He has not had any difficulty with urination.  His UCx and GC/Chlamydia cultures were negative.  He is still not wearing a brief underwear as we had discussed at previous visits.   Today, he states the pain is slightly diminished but still present.   He has not had any fevers, chills, nausea, or vomiting.   Today's, UA is negative for microscopic hematuria.  His UA from his last visit on 12/16/2014 was negative for microhematuria.     PMH: Past Medical History  Diagnosis Date  . Epididymitis     Chlamydial, Right  . Groin pain   . Chronic sinusitis   . Dyslipidemia   . Chronic pain syndrome   . Stroke 2006    x 2     . Fatigue   . Nicotine addiction   . Chronic back pain   . Erectile dysfunction   . Hydrocele     bilateral  . Testicular pain     Surgical History: Past Surgical History  Procedure Laterality Date  . Appendectomy      Home Medications:    Medication List       This list is accurate as of: 01/20/15 11:59 PM.  Always use your most recent med list.               aspirin EC 81 MG tablet  Take 81 mg by mouth daily.     atorvastatin 40 MG tablet  Commonly known as:  LIPITOR  Take 40 mg by mouth daily.     ciprofloxacin 500 MG tablet  Commonly known as:  CIPRO  Take 1 tablet (500 mg total) by mouth every 12 (twelve) hours.     CRESTOR 40 MG tablet  Generic drug:  rosuvastatin     ferrous sulfate 325 (65 FE) MG EC tablet     fluticasone 50 MCG/ACT nasal spray  Commonly known as:  FLONASE  Place 2 sprays into both nostrils daily.     ibuprofen 600 MG tablet  Commonly known as:  ADVIL,MOTRIN  Take 1 tablet (600 mg total) by mouth every 8 (eight) hours as needed.     meloxicam 15 MG tablet  Commonly known as:  MOBIC  Take 0.5 tablets (7.5 mg total) by mouth 2 (two) times daily after a meal.     vardenafil 5 MG tablet  Commonly known as:  LEVITRA  Take 5 mg by mouth as needed for erectile dysfunction.     varenicline 1 MG tablet  Commonly known as:  CHANTIX  Take 1 mg by mouth 2 (two) times daily.     Vitamin D (Ergocalciferol) 50000 UNITS Caps capsule  Commonly known as:  DRISDOL  take 1 capsule by mouth every week for 8 WEEKS.        Allergies: No Known Allergies  Family History: Family History  Problem Relation Age of Onset  . Cancer Mother   . Cancer Father  Social History:  reports that he has been smoking Cigarettes.  He has been smoking about 1.50 packs per day. He does not have any smokeless tobacco history on file. He reports that he does not drink alcohol or use illicit drugs.  ROS: UROLOGY Frequent Urination?: No Hard to postpone urination?: No Burning/pain with urination?: No Get up at night to urinate?: No Leakage of urine?: No Urine stream starts and stops?: No Trouble starting stream?: No Do you have to strain to urinate?: No Blood in urine?: Yes Urinary tract infection?: No Sexually transmitted disease?: No Injury to kidneys or bladder?: No Painful intercourse?: No Weak stream?: No Erection problems?: No Penile pain?: No  Gastrointestinal Nausea?: No Vomiting?: No Indigestion/heartburn?: No Diarrhea?: No Constipation?: No  Constitutional Fever: No Night sweats?: No Weight loss?: No Fatigue?: No  Skin Skin rash/lesions?: No Itching?: No  Eyes Blurred vision?: No Double vision?: No  Ears/Nose/Throat Sore throat?: No Sinus problems?:  No  Hematologic/Lymphatic Swollen glands?: No Easy bruising?: No  Cardiovascular Leg swelling?: No Chest pain?: No  Respiratory Cough?: No Shortness of breath?: No  Endocrine Excessive thirst?: No  Musculoskeletal Back pain?: No Joint pain?: No  Neurological Headaches?: No Dizziness?: No  Psychologic Depression?: No Anxiety?: No  Physical Exam: Blood pressure 118/78, pulse 84, resp. rate 18, height 6' (1.829 m), weight 231 lb 8 oz (105.008 kg).  Laboratory Data: Results for orders placed or performed in visit on 01/20/15  Microscopic Examination  Result Value Ref Range   WBC, UA 0-5 0 -  5 /hpf   RBC, UA 0-2 0 -  2 /hpf   Epithelial Cells (non renal) None seen 0 - 10 /hpf   Crystals Present (A) N/A   Crystal Type Calcium Oxalate N/A   Mucus, UA Present (A) Not Estab.   Bacteria, UA None seen None seen/Few  Urinalysis, Complete  Result Value Ref Range   Specific Gravity, UA >1.030 (H) 1.005 - 1.030   pH, UA 5.5 5.0 - 7.5   Color, UA Yellow Yellow   Appearance Ur Clear Clear   Leukocytes, UA Negative Negative   Protein, UA Negative Negative/Trace   Glucose, UA Negative Negative   Ketones, UA 1+ (A) Negative   RBC, UA Negative Negative   Bilirubin, UA Positive (A) Negative   Urobilinogen, Ur 1.0 0.2 - 1.0 mg/dL   Nitrite, UA Negative Negative   Microscopic Examination See below:    Pertinent Imaging: CLINICAL DATA: Orchitis. Epididymitis. Right testicular pain.  EXAM: SCROTAL ULTRASOUND  DOPPLER ULTRASOUND OF THE TESTICLES  TECHNIQUE: Complete ultrasound examination of the testicles, epididymis, and other scrotal structures was performed. Color and spectral Doppler ultrasound were also utilized to evaluate blood flow to the testicles.  COMPARISON: None.  FINDINGS: Right testicle  Measurements: 5.2 x 3.1 x 4.1 cm. No mass or microlithiasis visualized.  Left testicle  Measurements: 5 x 3.0 x 3.9 cm. No mass or  microlithiasis visualized.  Right epididymis: 1.5 x 1.0 x 2.1 cm. There are 2 cysts identified, the largest measures 6 mm.  Left epididymis: Measures 1.9 x 1.0 x 2.0 cm. Cyst measures 5 mm.  Hydrocele: Bilateral hydroceles identified.  Varicocele: There are bilateral varicoceles.  Pulsed Doppler interrogation of both testes demonstrates normal low resistance arterial and venous waveforms bilaterally.  IMPRESSION: 1. No evidence for testicular mass or torsion. 2. Bilateral hydroceles. 3. Epididymal cysts. 4. Varicoceles.   Electronically Signed  By: Signa Kell M.D.  On: 12/29/2014 16:23  Assessment & Plan:  1. Bilateral hydroceles:    Patient  states he is still having scrotal pain, but he fails to engage in conservative management as we have discussed at previous visits. I have encouraged him again to invest in pairs of supportive brief underwear to help reduce the strain on the testicles.  He is to return to our clinic if the hydroceles become larger,  interfere with life activities or if conservative management fails.  2. Bilateral varicoceles:   Patient is encouraged to invest in pairs of supportive brief underwear to help reduce the strain on the scrotum.   He is to return to our clinic if the varicoceles become larger,  interfere with life activities or if conservative management fails.  2. Microscopic hematuria: UA on 11/29/2014 contained 3-10 RBC's/hpf.  UA  was negative for microscopic hematuria on 12/16/2014 and today's exam.   He is to return to [place] if he should develop gross hematuria or found to have persisting microscopic hematuria by his primary care physician.   - Urinalysis, Complete   No Follow-up on file.  Michiel Cowboy, PA-C  [facility] [address] [place], [place] [address] [phone]

## 2015-02-04 ENCOUNTER — Other Ambulatory Visit: Payer: Self-pay | Admitting: Urology

## 2015-02-10 ENCOUNTER — Encounter: Payer: Self-pay | Admitting: *Deleted

## 2015-02-10 DIAGNOSIS — R42 Dizziness and giddiness: Secondary | ICD-10-CM | POA: Insufficient documentation

## 2015-02-10 DIAGNOSIS — Z720 Tobacco use: Secondary | ICD-10-CM | POA: Diagnosis not present

## 2015-02-10 DIAGNOSIS — R531 Weakness: Secondary | ICD-10-CM | POA: Insufficient documentation

## 2015-02-10 DIAGNOSIS — N508 Other specified disorders of male genital organs: Secondary | ICD-10-CM | POA: Insufficient documentation

## 2015-02-10 NOTE — ED Notes (Signed)
Pt c/o testicular pain x 7 months. Pt c/o dizziness and weakness for same amount of time. Pt has recently been diagnosed w/ colon cancer x 1 month ago. Pt has appointment for pandoscopy in October but was told if he felt worse to come to ED. Pt has a laundry list of complaints but states his pain and swelling in his testicle is worse tonight.

## 2015-02-11 ENCOUNTER — Emergency Department
Admission: EM | Admit: 2015-02-11 | Discharge: 2015-02-11 | Discharge status: Against Medical Advice | Payer: Medicare Other | Attending: Emergency Medicine | Admitting: Emergency Medicine

## 2015-02-11 ENCOUNTER — Other Ambulatory Visit: Payer: Self-pay | Admitting: Urology

## 2015-02-15 ENCOUNTER — Ambulatory Visit: Payer: Medicare Other | Attending: Anesthesiology | Admitting: Anesthesiology

## 2015-02-15 ENCOUNTER — Encounter: Payer: Self-pay | Admitting: Anesthesiology

## 2015-02-15 VITALS — BP 126/90 | HR 80 | Temp 97.8°F | Resp 20 | Ht 72.0 in | Wt 235.0 lb

## 2015-02-15 DIAGNOSIS — G8929 Other chronic pain: Secondary | ICD-10-CM | POA: Insufficient documentation

## 2015-02-15 DIAGNOSIS — M5136 Other intervertebral disc degeneration, lumbar region: Secondary | ICD-10-CM | POA: Insufficient documentation

## 2015-02-15 DIAGNOSIS — M5137 Other intervertebral disc degeneration, lumbosacral region: Secondary | ICD-10-CM

## 2015-02-15 DIAGNOSIS — M545 Low back pain, unspecified: Secondary | ICD-10-CM

## 2015-02-15 DIAGNOSIS — M51379 Other intervertebral disc degeneration, lumbosacral region without mention of lumbar back pain or lower extremity pain: Secondary | ICD-10-CM | POA: Insufficient documentation

## 2015-02-15 DIAGNOSIS — M5416 Radiculopathy, lumbar region: Secondary | ICD-10-CM | POA: Insufficient documentation

## 2015-02-15 MED ORDER — OXYCODONE HCL ER 15 MG PO T12A: 15.0000 mg | EXTENDED_RELEASE_TABLET | Freq: Two times a day (BID) | ORAL | Status: DC

## 2015-02-15 NOTE — Progress Notes (Signed)
   Subjective:    Patient ID: Manuel Richard, male    DOB: [birth year], 44 y.o.   MRN: [id number] This gentleman returning to the clinic today indicating that he continues to have severe pain. He indicated that the meloxicam did not give him any pain relief He indicated that his subjective pain intensity rating is 100% He demonstrated pain exaggeration significant pain behavior and also pain dramatization He indicated that he's been diagnosed with colon cancer although there is no evidence of this in the record He was seen by the urologist and investigated for microscopic hematuria but nothing dramatic has been found at this time He indicates that his back pain is interfering with his quality of life He was emphatic to point out that he is to take 6 Percocets and codeines while he was in [place] and I reassured him that he would not be getting those kinds of medications in those doses from me He appeared to accept my reservation.  HPI    Review of Systems  Constitutional: Negative.   HENT: Negative.   Eyes: Negative.   Respiratory: Negative.   Cardiovascular: Negative.   Gastrointestinal: Negative.        Patient indicated that he was diagnosed with colon cancer but there is no record of this finding in his chart  Endocrine: Negative.   Genitourinary: Negative.        Patient was investigated for microscopic hematuria  Allergic/Immunologic: Negative.   Neurological: Negative.   Hematological: Negative.        Objective:   Physical Exam  Constitutional: He appears well-developed and well-nourished. No distress.  Skin: He is not diaphoretic.   physical examination was essentially normal and unchanged. His blood pressure was 126/90 mmHg His pulse was 80 breaths per minute equal and regular Heart sounds 1 and 2 were heard in all areas and there were no audible murmurs SPO2 was 100%  Chest is clinically clear and there were no adventitious sounds Abdomen was soft and nontender  has no palpable organomegaly no significant lymphadenopathy Pupils were equal and reactive  Cranial nerves were intact There were no new neurological or musculoskeletal findings other than his complaints of tenderness in the lower lumbar area        Assessment & Plan:    Assessment 1 chronic low back pain 2 lumbar degenerative disc disease 3 lumbar radiculopathy 4 possible diagnosis of colon cancer  Plan of management 1 Will discontinue meloxicam 2 Will begin the patient on OxyContin 15 mg every 12 hours and will give him 42 pills 3 Will reevaluate him in 3 weeks 4 Will perform a drug screen today and check for the results at the next visi  Established patient    level II    Tod Persia M.D. t

## 2015-02-15 NOTE — Progress Notes (Signed)
Safety precautions to be maintained throughout the outpatient stay will include: orient to surroundings, keep bed in low position, maintain call bell within reach at all times, provide assistance with transfer out of bed and ambulation.  

## 2015-02-17 ENCOUNTER — Telehealth: Payer: Self-pay | Admitting: Anesthesiology

## 2015-02-17 NOTE — Telephone Encounter (Signed)
Patient notified PA still pending.

## 2015-02-17 NOTE — Telephone Encounter (Signed)
Wants to know how long he has to wait for prior auth to be done on his meds

## 2015-02-20 ENCOUNTER — Telehealth: Payer: Self-pay | Admitting: Anesthesiology

## 2015-02-20 MED ORDER — OXYCODONE HCL ER 15 MG PO T12A: 10.0000 mg | EXTENDED_RELEASE_TABLET | Freq: Two times a day (BID) | ORAL | Status: DC

## 2015-02-20 NOTE — Telephone Encounter (Signed)
Patient's insurance denied coverage for medication prescribed / what to do now ? Please call

## 2015-02-20 NOTE — Telephone Encounter (Signed)
Pt. Informed Oxycontin denied by insurance. Will come Wed for appt to discuss other options

## 2015-02-22 ENCOUNTER — Ambulatory Visit: Payer: Medicare Other | Attending: Anesthesiology | Admitting: Anesthesiology

## 2015-02-22 ENCOUNTER — Encounter: Payer: Self-pay | Admitting: Anesthesiology

## 2015-02-22 VITALS — BP 116/91 | HR 74 | Temp 97.7°F | Resp 20 | Ht 72.0 in | Wt 234.0 lb

## 2015-02-22 DIAGNOSIS — M5137 Other intervertebral disc degeneration, lumbosacral region: Secondary | ICD-10-CM

## 2015-02-22 DIAGNOSIS — M5136 Other intervertebral disc degeneration, lumbar region: Secondary | ICD-10-CM | POA: Insufficient documentation

## 2015-02-22 DIAGNOSIS — M5416 Radiculopathy, lumbar region: Secondary | ICD-10-CM | POA: Insufficient documentation

## 2015-02-22 DIAGNOSIS — G8929 Other chronic pain: Secondary | ICD-10-CM | POA: Diagnosis present

## 2015-02-22 DIAGNOSIS — M545 Low back pain, unspecified: Secondary | ICD-10-CM

## 2015-02-22 DIAGNOSIS — Z7289 Other problems related to lifestyle: Secondary | ICD-10-CM | POA: Diagnosis not present

## 2015-02-22 DIAGNOSIS — Z765 Malingerer [conscious simulation]: Secondary | ICD-10-CM

## 2015-02-22 MED ORDER — BUPRENORPHINE 5 MCG/HR TD PTWK: 5.0000 ug | MEDICATED_PATCH | TRANSDERMAL | Status: DC

## 2015-02-22 NOTE — Progress Notes (Signed)
   Subjective:    Patient ID: Manuel Richard, male    DOB: 1971/05/19, 44 y.o.   MRN: [id number] This patient returned to the clinic today indicating that the OxyContin that I. Prescribed for him was not approved by his insurance company He has in the past demonstrated drug-seeking behavior He did not bring the old prescription with him and he said that it was left in the [facility] pharmacy I am trying to retrieve it from the pharmacy His subjective pain intensity rating today is 80% HPI    Review of Systems  Constitutional: Negative.   HENT: Negative.   Eyes: Negative.   Respiratory: Negative.   Cardiovascular: Negative.   Gastrointestinal: Negative.   Endocrine: Negative.   Genitourinary: Negative.   Musculoskeletal: Negative.   Skin: Negative.   Allergic/Immunologic: Negative.   Neurological: Negative.   Hematological: Negative.   Psychiatric/Behavioral: Negative.        Objective:   Physical Exam  Constitutional: He appears well-developed and well-nourished. No distress.  HENT:  Head: Normocephalic and atraumatic.  Right Ear: External ear normal.  Nose: Nose normal.  Mouth/Throat: Oropharynx is clear and moist. No oropharyngeal exudate.  Eyes: Conjunctivae and EOM are normal. Pupils are equal, round, and reactive to light. Left eye exhibits no discharge. No scleral icterus.  Neck: Normal range of motion. Neck supple. No JVD present. No tracheal deviation present. No thyromegaly present.  Cardiovascular: Normal rate, regular rhythm, normal heart sounds and intact distal pulses.  Exam reveals no gallop and no friction rub.   No murmur heard. Pulmonary/Chest: Effort normal and breath sounds normal. No stridor. No respiratory distress. He has no wheezes. He has no rales. He exhibits no tenderness.  Abdominal: He exhibits no distension and no mass. There is no tenderness. There is no rebound and no guarding.  Genitourinary:  Genitourinary examination was deferred   Musculoskeletal: Normal range of motion. He exhibits no edema or tenderness.  Lymphadenopathy:    He has no cervical adenopathy.  Neurological: He is alert. He has normal reflexes. He displays normal reflexes. No cranial nerve deficit. He exhibits normal muscle tone. Coordination normal.  Skin: Skin is warm and dry. No rash noted. He is not diaphoretic. No erythema. No pallor.  Psychiatric:  Patient demonstrated drug-seeking behavior          Assessment & Plan:    Assessment 1 chronic low back pain 2 lumbar degenerative disc disease 3 lumbar radiculopathy 4 drug-seeking behavior   Plan of management 1 Since the patient's insurance did not authorize OxyContin I will then try him on Butrans patch 5 g per week and will give him 4 patches 2 We will follow-up with him in 3 weeks   Established patient level III  Tod Persia M.D.

## 2015-02-22 NOTE — Progress Notes (Signed)
Safety precautions to be maintained throughout the outpatient stay will include: orient to surroundings, keep bed in low position, maintain call bell within reach at all times, provide assistance with transfer out of bed and ambulation.  

## 2015-02-22 NOTE — Patient Instructions (Signed)
You were given a prescription for Butrans Patch today

## 2015-03-01 ENCOUNTER — Encounter: Payer: Self-pay | Admitting: *Deleted

## 2015-03-02 ENCOUNTER — Encounter
Admission: RE | Disposition: A | Discharge status: Home / Self Care | Payer: Self-pay | Source: Ambulatory Visit | Attending: Gastroenterology

## 2015-03-02 ENCOUNTER — Ambulatory Visit: Payer: Medicare Other | Admitting: Registered Nurse

## 2015-03-02 ENCOUNTER — Ambulatory Visit
Admission: RE | Admit: 2015-03-02 | Discharge: 2015-03-02 | Disposition: A | Discharge status: Home / Self Care | Payer: Medicare Other | Source: Ambulatory Visit | Attending: Gastroenterology | Admitting: Gastroenterology

## 2015-03-02 DIAGNOSIS — F17210 Nicotine dependence, cigarettes, uncomplicated: Secondary | ICD-10-CM | POA: Diagnosis not present

## 2015-03-02 DIAGNOSIS — Z539 Procedure and treatment not carried out, unspecified reason: Secondary | ICD-10-CM | POA: Insufficient documentation

## 2015-03-02 DIAGNOSIS — N529 Male erectile dysfunction, unspecified: Secondary | ICD-10-CM | POA: Diagnosis not present

## 2015-03-02 DIAGNOSIS — Z8673 Personal history of transient ischemic attack (TIA), and cerebral infarction without residual deficits: Secondary | ICD-10-CM | POA: Diagnosis not present

## 2015-03-02 DIAGNOSIS — Z79899 Other long term (current) drug therapy: Secondary | ICD-10-CM | POA: Insufficient documentation

## 2015-03-02 DIAGNOSIS — A5619 Other chlamydial genitourinary infection: Secondary | ICD-10-CM | POA: Diagnosis not present

## 2015-03-02 DIAGNOSIS — N5089 Other specified disorders of the male genital organs: Secondary | ICD-10-CM | POA: Insufficient documentation

## 2015-03-02 DIAGNOSIS — R300 Dysuria: Secondary | ICD-10-CM | POA: Insufficient documentation

## 2015-03-02 DIAGNOSIS — Z7982 Long term (current) use of aspirin: Secondary | ICD-10-CM | POA: Diagnosis not present

## 2015-03-02 DIAGNOSIS — N50811 Right testicular pain: Secondary | ICD-10-CM | POA: Insufficient documentation

## 2015-03-02 DIAGNOSIS — Z7951 Long term (current) use of inhaled steroids: Secondary | ICD-10-CM | POA: Insufficient documentation

## 2015-03-02 DIAGNOSIS — E785 Hyperlipidemia, unspecified: Secondary | ICD-10-CM | POA: Diagnosis not present

## 2015-03-02 DIAGNOSIS — M545 Low back pain: Secondary | ICD-10-CM | POA: Diagnosis not present

## 2015-03-02 DIAGNOSIS — N50819 Testicular pain, unspecified: Secondary | ICD-10-CM | POA: Diagnosis present

## 2015-03-02 HISTORY — DX: Reserved for inherently not codable concepts without codable children: IMO0001

## 2015-03-02 HISTORY — DX: Anemia, unspecified: D64.9

## 2015-03-02 HISTORY — DX: Pure hypercholesterolemia, unspecified: E78.00

## 2015-03-02 SURGERY — ESOPHAGOGASTRODUODENOSCOPY (EGD) WITH PROPOFOL
Anesthesia: General

## 2015-03-02 MED ORDER — SODIUM CHLORIDE 0.9 % IV SOLN: INTRAVENOUS | Status: DC

## 2015-03-08 ENCOUNTER — Ambulatory Visit: Payer: Medicare Other | Attending: Anesthesiology | Admitting: Anesthesiology

## 2015-03-08 ENCOUNTER — Encounter: Payer: Self-pay | Admitting: Anesthesiology

## 2015-03-08 VITALS — BP 128/79 | HR 65 | Temp 98.2°F | Resp 20 | Ht 72.0 in | Wt 246.0 lb

## 2015-03-08 DIAGNOSIS — M545 Low back pain, unspecified: Secondary | ICD-10-CM

## 2015-03-08 DIAGNOSIS — M5137 Other intervertebral disc degeneration, lumbosacral region: Secondary | ICD-10-CM

## 2015-03-08 DIAGNOSIS — M5136 Other intervertebral disc degeneration, lumbar region: Secondary | ICD-10-CM | POA: Insufficient documentation

## 2015-03-08 DIAGNOSIS — G8929 Other chronic pain: Secondary | ICD-10-CM | POA: Insufficient documentation

## 2015-03-08 DIAGNOSIS — M5416 Radiculopathy, lumbar region: Secondary | ICD-10-CM | POA: Insufficient documentation

## 2015-03-08 DIAGNOSIS — Z765 Malingerer [conscious simulation]: Secondary | ICD-10-CM | POA: Diagnosis present

## 2015-03-08 MED ORDER — OXYCODONE-ACETAMINOPHEN 5-325 MG PO TABS: 1.0000 | ORAL_TABLET | Freq: Three times a day (TID) | ORAL | Status: DC | PRN

## 2015-03-08 NOTE — Progress Notes (Signed)
Safety precautions to be maintained throughout the outpatient stay will include: orient to surroundings, keep bed in low position, maintain call bell within reach at all times, provide assistance with transfer out of bed and ambulation.  

## 2015-03-08 NOTE — Progress Notes (Signed)
   Subjective:    Patient ID: Manuel Richard, male    DOB: 01-28-1971, [age] y.o.   MRN: [id number] Mr.Vollman returns to the clinic today still showing deliberately and aggressive signs of drug seeking behavior He has still refused to have any interventions and consistently asked for opioid medication I consistently tell him that I am not going to give him opioids because it is inappropriate to produce He pointed out that while he was in [place] he had large doses and I advised him that I did not feel obligated to do what his physicians in [place]. 4 I had placed him on a Butrans patch and he indicated that that was "like taken baby aspirin" He has demonstrated consistent drug-seeking behavior and in view of the fact that he is refusing to consider interventions especially as he has pathology indicating that he may benefit from interventions I would give him medications enough test to see his primary care physician He is scheduled to see his primary care physician on Friday of this week and I am therefore discharging him back to his primary care physician HPI    Review of Systems  Constitutional: Negative.   HENT: Negative.   Eyes: Negative.   Respiratory: Negative.   Cardiovascular: Negative.   Gastrointestinal: Negative.   Endocrine: Negative.   Genitourinary: Negative.   Musculoskeletal: Negative.   Allergic/Immunologic: Negative.   Neurological: Negative.   Hematological: Negative.   Psychiatric/Behavioral:       This patient has demonstrated aggressive drug-seeking behavior and is very manipulative and demanding.       Objective:   Physical Exam  Cardiovascular:  Physical examination is unremarkable and unchanged This patient does not appear to be in any significant discomfort but he shows pain dramatization and pain exaggeration all features of drug-seeking behavior His blood pressure is 128/79  His pulse is 65 bpm equal and regular Out sounds 1 and 2 were heard in all  areas There were no audible murmurs Temperature is 98.2 Chest is clinically clear  There are no adventitious sounds Abdomen is soft and nontender No palpable organomegaly No significant lymphadenopathy Pupils are equal and reactive Cranial nerves are intact There are no new neurological or musculoskeletal findings     Nursing note and vitals reviewed.         Assessment & Plan:   Assessment 1 chronic low back pain 2 lumbar degenerative disc disease 3 lumbar radiculopathy 4 drug-seeking behavior   Plan of management Since this patient refuses to consider any interventions and since she continues to be very aggressive in his drug-seeking behavior, I will give him a 10 day supply of Percocet 5/325 to be taken one 3 times a day on a when necessary basis (30 tablets) and will refer him back to his primary care physician He is therefore discharged from this clinic  Established patient  level II  Tod PersiaWinston Urho Rio M.D.

## 2015-03-13 ENCOUNTER — Ambulatory Visit
Admission: RE | Admit: 2015-03-13 | Discharge: 2015-03-13 | Disposition: A | Discharge status: Home / Self Care | Payer: Medicare Other | Source: Ambulatory Visit | Attending: Gastroenterology | Admitting: Gastroenterology

## 2015-03-13 ENCOUNTER — Encounter
Admission: RE | Disposition: A | Discharge status: Home / Self Care | Payer: Self-pay | Source: Ambulatory Visit | Attending: Gastroenterology

## 2015-03-13 DIAGNOSIS — R195 Other fecal abnormalities: Secondary | ICD-10-CM | POA: Diagnosis present

## 2015-03-13 DIAGNOSIS — Z539 Procedure and treatment not carried out, unspecified reason: Secondary | ICD-10-CM | POA: Insufficient documentation

## 2015-03-13 SURGERY — COLONOSCOPY WITH PROPOFOL
Anesthesia: General

## 2015-03-13 MED ORDER — SODIUM CHLORIDE 0.9 % IV SOLN: INTRAVENOUS | Status: DC

## 2015-03-13 NOTE — Anesthesia Preprocedure Evaluation (Deleted)
Anesthesia Evaluation    Airway       Dental   Pulmonary Current Smoker,          Cardiovascular     Neuro/Psych    GI/Hepatic   Endo/Other    Renal/GU      Musculoskeletal   Abdominal   Peds  Hematology   Anesthesia Other Findings   Reproductive/Obstetrics                           Anesthesia Physical Anesthesia Plan Anesthesia Quick Evaluation  

## 2015-05-31 DIAGNOSIS — G8929 Other chronic pain: Secondary | ICD-10-CM | POA: Insufficient documentation

## 2015-05-31 DIAGNOSIS — M545 Low back pain, unspecified: Secondary | ICD-10-CM | POA: Insufficient documentation

## 2015-06-15 ENCOUNTER — Encounter: Payer: Self-pay | Admitting: *Deleted

## 2015-06-16 ENCOUNTER — Ambulatory Visit: Admission: RE | Admit: 2015-06-16 | Payer: Medicare Other | Source: Ambulatory Visit | Admitting: Gastroenterology

## 2015-06-16 ENCOUNTER — Encounter: Admission: RE | Payer: Self-pay | Source: Ambulatory Visit

## 2015-06-16 SURGERY — COLONOSCOPY WITH PROPOFOL
Anesthesia: General

## 2016-09-30 ENCOUNTER — Other Ambulatory Visit: Payer: Self-pay | Admitting: Nurse Practitioner

## 2016-09-30 DIAGNOSIS — N50819 Testicular pain, unspecified: Secondary | ICD-10-CM

## 2016-10-23 ENCOUNTER — Ambulatory Visit: Payer: Medicare Other

## 2017-03-15 IMAGING — US US ART/VEN ABD/PELV/SCROTUM DOPPLER LTD
1 series · 14 of 25 positions shown · non-contrast
Comparison: None.

CLINICAL DATA: Orchitis.  Epididymitis.  Right testicular pain.

EXAM:
SCROTAL ULTRASOUND
DOPPLER ULTRASOUND OF THE TESTICLES
TECHNIQUE: Complete ultrasound examination of the testicles, epididymis, and
other scrotal structures was performed. Color and spectral Doppler
ultrasound were also utilized to evaluate blood flow to the
testicles.

[Series 1: us art/ven abd/pelv/scrotum doppler ltd · 0.09mm/px · 14 of 92 slices shown]
[im 1/92]
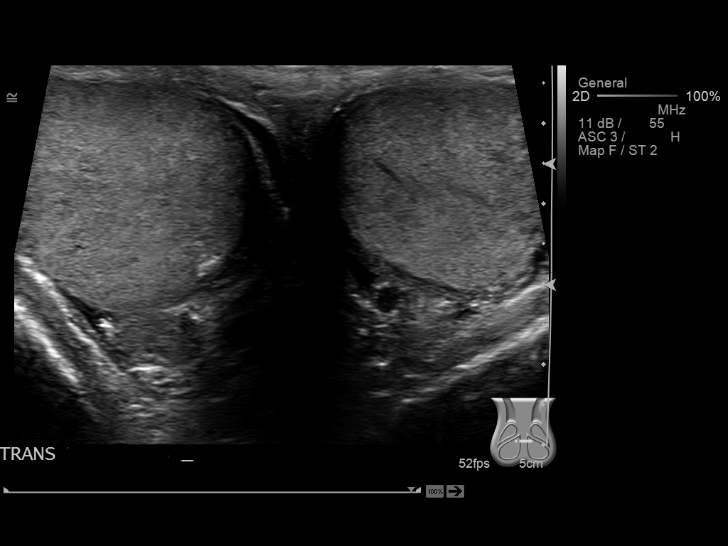
[im 8/92]
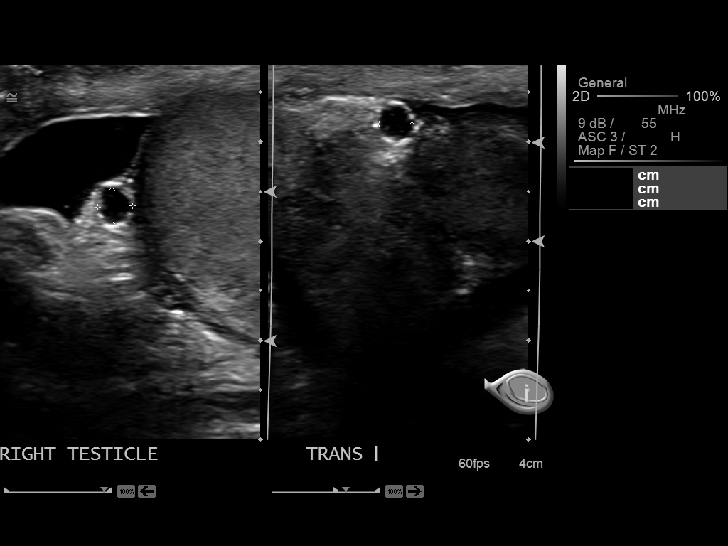
[im 16/92]
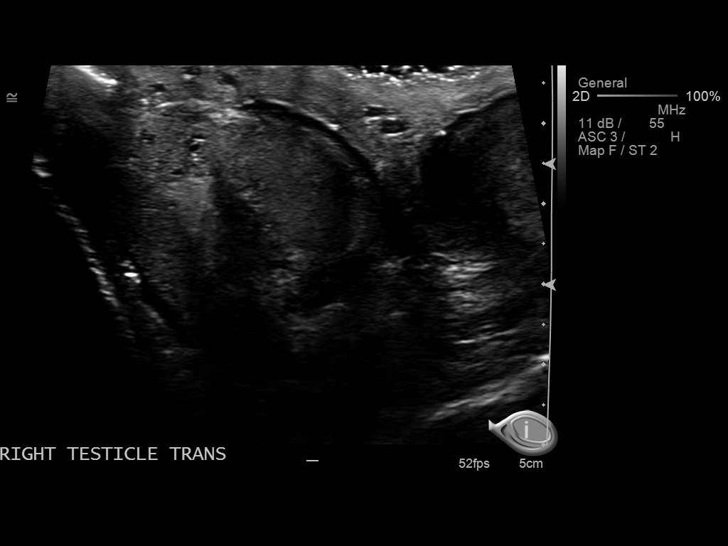
[im 23/92]
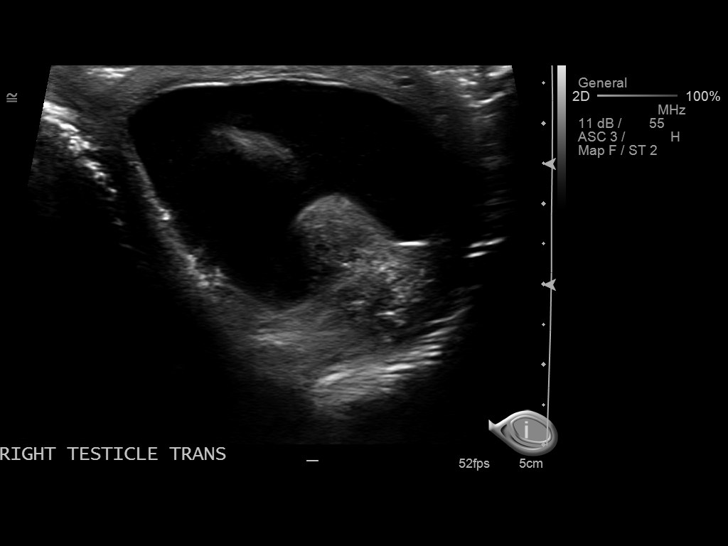
[im 31/92]
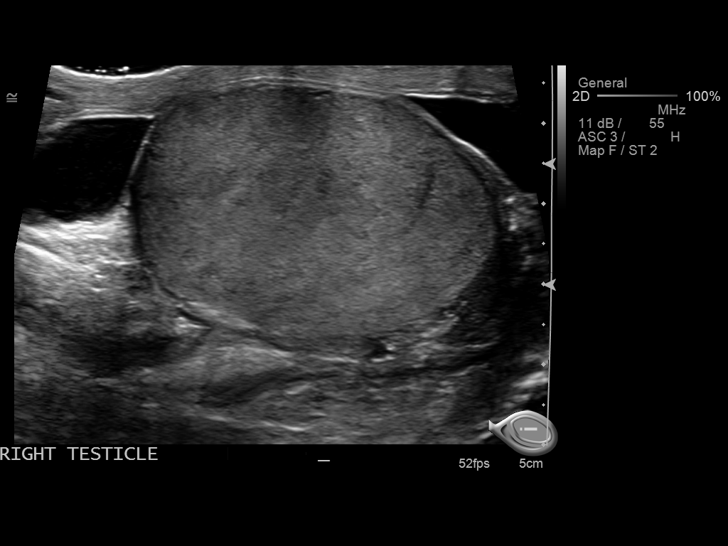
[im 35/92]
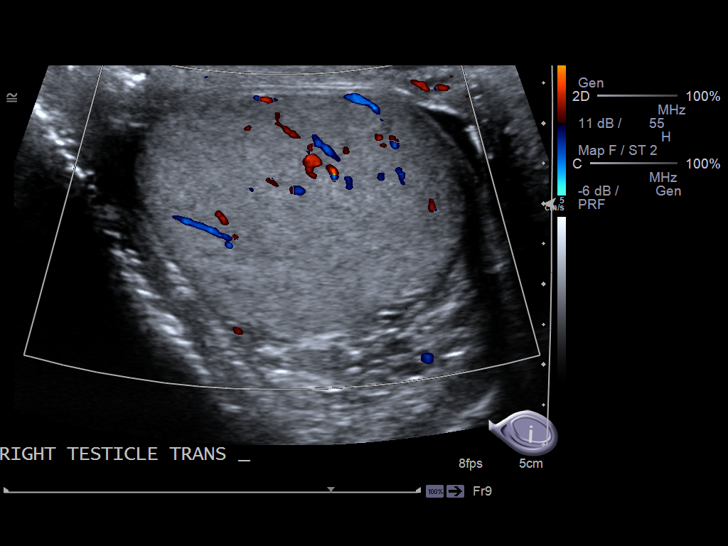
[im 42/92]
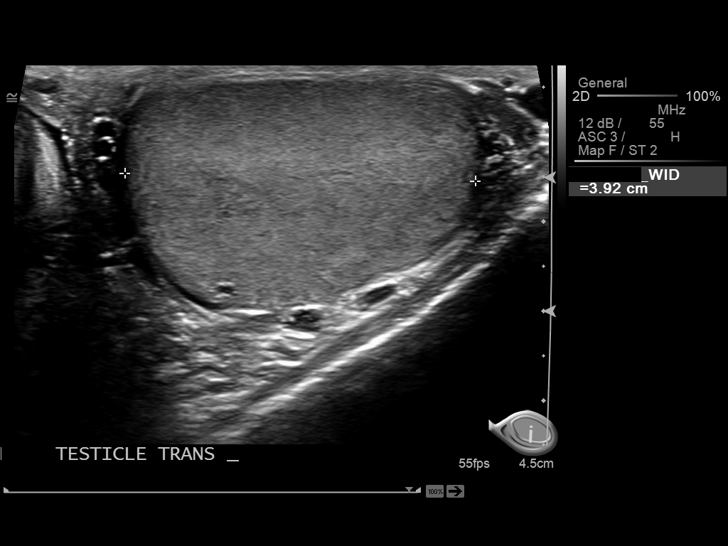
[im 50/92]
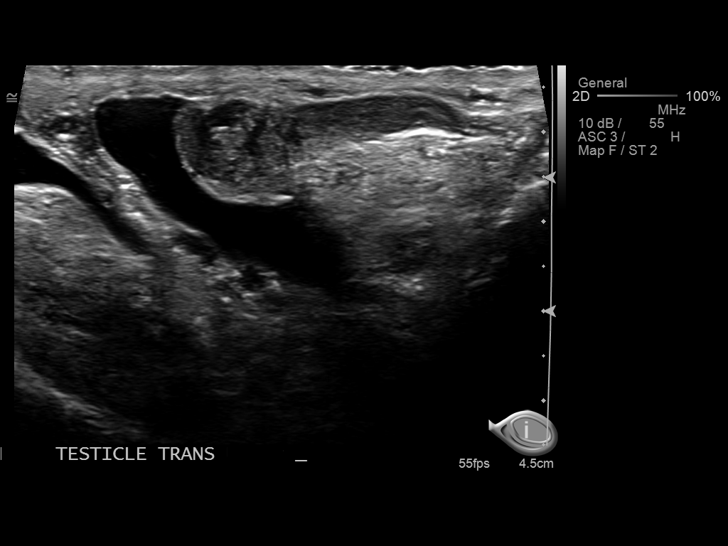
[im 57/92]
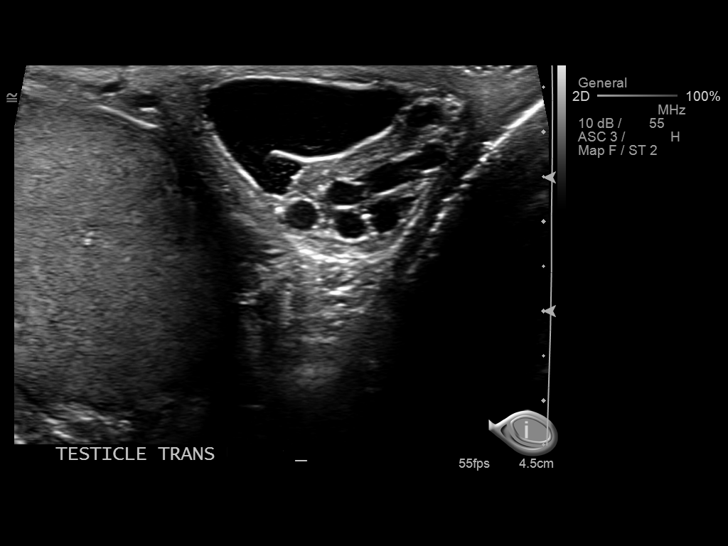
[im 61/92]
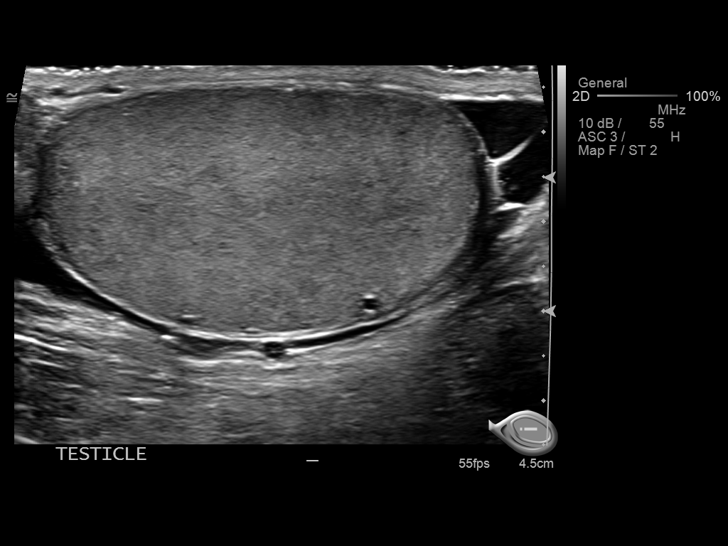
[im 69/92]
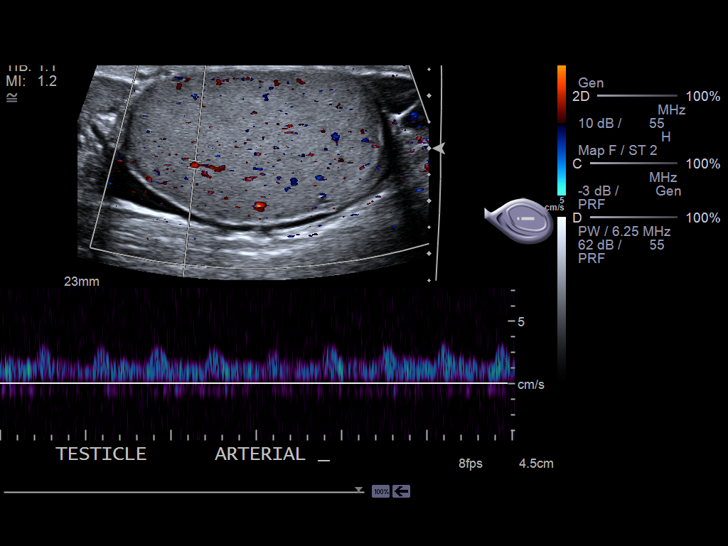
[im 76/92]
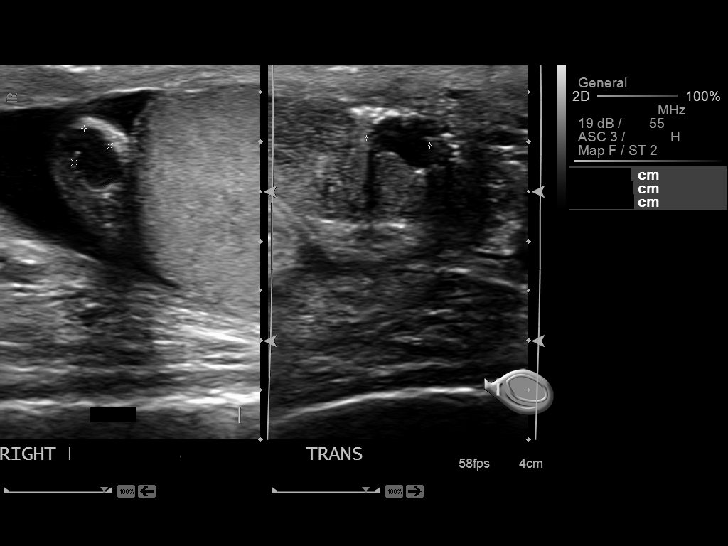
[im 84/92]
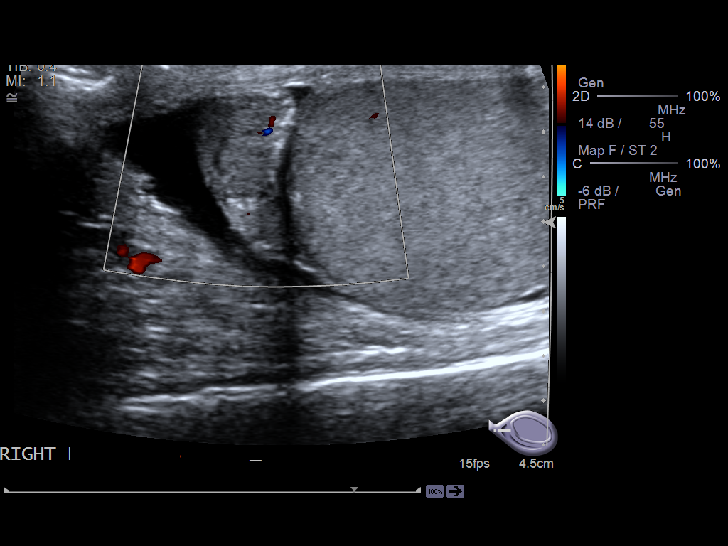
[im 92/92]
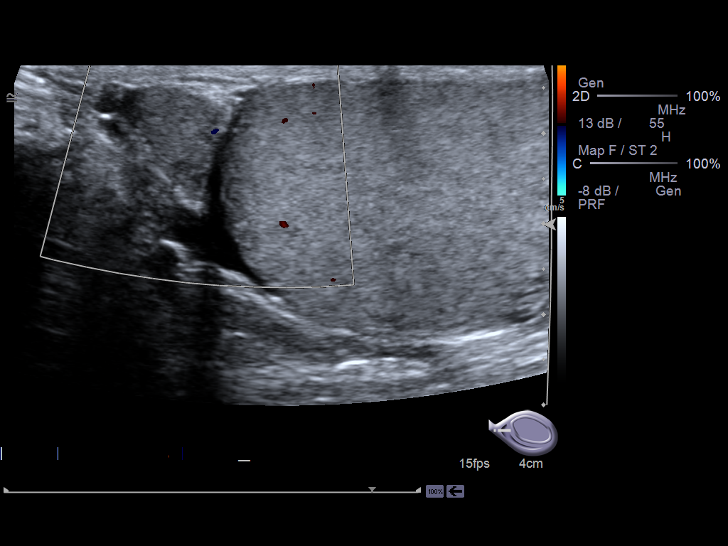

[14 of 25 positions shown; findings below may reference images not displayed]

FINDINGS: Right testicle

Measurements: 5.2 x 3.1 x 4.1 cm. No mass or microlithiasis
visualized.

Left testicle

Measurements: 5 x 3.0 x 3.9 cm. No mass or microlithiasis
visualized.

Right epididymis: 1.5 x 1.0 x 2.1 cm. There are 2 cysts identified,
the largest measures 6 mm.

Left epididymis:  Measures 1.9 x 1.0 x 2.0 cm.  Cyst measures 5 mm.

Hydrocele:  Bilateral hydroceles identified.

Varicocele:  There are bilateral varicoceles.

Pulsed Doppler interrogation of both testes demonstrates normal low
resistance arterial and venous waveforms bilaterally.
IMPRESSION: 1. No evidence for testicular mass or torsion.
2. Bilateral hydroceles.
3. Epididymal cysts.
4. Varicoceles.

## 2017-05-06 ENCOUNTER — Emergency Department: Payer: Medicare Other

## 2017-05-06 ENCOUNTER — Other Ambulatory Visit: Payer: Self-pay

## 2017-05-06 DIAGNOSIS — S2232XA Fracture of one rib, left side, initial encounter for closed fracture: Secondary | ICD-10-CM | POA: Insufficient documentation

## 2017-05-06 DIAGNOSIS — Z7982 Long term (current) use of aspirin: Secondary | ICD-10-CM | POA: Diagnosis not present

## 2017-05-06 DIAGNOSIS — Z79899 Other long term (current) drug therapy: Secondary | ICD-10-CM | POA: Insufficient documentation

## 2017-05-06 DIAGNOSIS — Y939 Activity, unspecified: Secondary | ICD-10-CM | POA: Insufficient documentation

## 2017-05-06 DIAGNOSIS — S299XXA Unspecified injury of thorax, initial encounter: Secondary | ICD-10-CM | POA: Diagnosis present

## 2017-05-06 DIAGNOSIS — Y999 Unspecified external cause status: Secondary | ICD-10-CM | POA: Insufficient documentation

## 2017-05-06 DIAGNOSIS — Y929 Unspecified place or not applicable: Secondary | ICD-10-CM | POA: Insufficient documentation

## 2017-05-06 DIAGNOSIS — F17210 Nicotine dependence, cigarettes, uncomplicated: Secondary | ICD-10-CM | POA: Insufficient documentation

## 2017-05-06 DIAGNOSIS — W000XXA Fall on same level due to ice and snow, initial encounter: Secondary | ICD-10-CM | POA: Diagnosis not present

## 2017-05-06 NOTE — ED Triage Notes (Addendum)
Pt arrives to ED via [facility] with c/o LEFT-sided rib pain s/p slip and fall x30-45 minutes PTA. Pt reports h/x of fractured ribs on same side "3 years ago". Pt denies head injury or LOC, is A&O, in NAD; RR even, regular, and unlabored.

## 2017-05-06 NOTE — ED Notes (Signed)
Pt fell earlier today co left rib pain, hx of rib fx on the same side.

## 2017-05-07 ENCOUNTER — Emergency Department
Admission: EM | Admit: 2017-05-07 | Discharge: 2017-05-07 | Disposition: A | Discharge status: Home / Self Care | Payer: Medicare Other | Attending: Emergency Medicine | Admitting: Emergency Medicine

## 2017-05-07 DIAGNOSIS — S2232XA Fracture of one rib, left side, initial encounter for closed fracture: Secondary | ICD-10-CM

## 2017-05-07 MED ORDER — HYDROCODONE-ACETAMINOPHEN 5-325 MG PO TABS: 1.0000 | ORAL_TABLET | Freq: Four times a day (QID) | ORAL | 0 refills | Status: DC | PRN

## 2017-05-07 MED ORDER — IBUPROFEN 600 MG PO TABS
600.0000 mg | ORAL_TABLET | Freq: Once | ORAL | Status: AC
  Administered 2017-05-07: 600 mg via ORAL
  Filled 2017-05-07: qty 1

## 2017-05-07 MED ORDER — HYDROCODONE-ACETAMINOPHEN 5-325 MG PO TABS
2.0000 | ORAL_TABLET | Freq: Once | ORAL | Status: AC
  Administered 2017-05-07: 2 via ORAL
  Filled 2017-05-07: qty 2

## 2017-05-07 MED ORDER — IBUPROFEN 600 MG PO TABS: 600.0000 mg | ORAL_TABLET | Freq: Three times a day (TID) | ORAL | 0 refills | Status: DC | PRN

## 2017-05-07 NOTE — Discharge Instructions (Signed)
Please take your pain medication as needed for severe symptoms and follow-up with your primary care physician as needed.  It was a pleasure to take care of you today, and thank you for coming to our emergency department.  If you have any questions or concerns before leaving please ask the nurse to grab me and I'm more than happy to go through your aftercare instructions again.  If you were prescribed any opioid pain medication today such as Norco, Vicodin, Percocet, morphine, hydrocodone, or oxycodone please make sure you do not drive when you are taking this medication as it can alter your ability to drive safely.  If you have any concerns once you are home that you are not improving or are in fact getting worse before you can make it to your follow-up appointment, please do not hesitate to call [phone] and come back for further evaluation.  Merrily BrittleNeil Reine Bristow, MD  Results for orders placed or performed in visit on 01/20/15  Microscopic Examination  Result Value Ref Range   WBC, UA 0-5 0 - 5 /hpf   RBC, UA 0-2 0 - 2 /hpf   Epithelial Cells (non renal) None seen 0 - 10 /hpf   Crystals Present (A) N/A   Crystal Type Calcium Oxalate N/A   Mucus, UA Present (A) Not Estab.   Bacteria, UA None seen None seen/Few  Urinalysis, Complete  Result Value Ref Range   Specific Gravity, UA >1.030 (H) 1.005 - 1.030   pH, UA 5.5 5.0 - 7.5   Color, UA Yellow Yellow   Appearance Ur Clear Clear   Leukocytes, UA Negative Negative   Protein, UA Negative Negative/Trace   Glucose, UA Negative Negative   Ketones, UA 1+ (A) Negative   RBC, UA Negative Negative   Bilirubin, UA Positive (A) Negative   Urobilinogen, Ur 1.0 0.2 - 1.0 mg/dL   Nitrite, UA Negative Negative   Microscopic Examination See below:    Dg Ribs Unilateral W/chest Left  Result Date: 05/06/2017 CLINICAL DATA:  Acute onset of left-sided rib pain status post fall. Initial encounter. EXAM: LEFT RIBS AND CHEST - 3+ VIEW COMPARISON:  None.  FINDINGS: There appears to be a minimally displaced fracture of the left anterolateral seventh rib. The lungs are well-aerated. Minimal left basilar atelectasis or scarring is noted. There is no evidence of pleural effusion or pneumothorax. The cardiomediastinal silhouette is within normal limits. No additional osseous abnormalities are seen. IMPRESSION: 1. Minimally displaced fracture of the left anterolateral seventh rib. 2. Minimal left basilar atelectasis or scarring noted. Electronically Signed   By: Roanna RaiderJeffery  Chang M.D.   On: 05/06/2017 23:41

## 2017-05-07 NOTE — ED Provider Notes (Signed)
[facility] Emergency Department Provider Note  ____________________________________________   First MD Initiated Contact with Patient 05/07/17 0022     (approximate)  I have reviewed the triage vital signs and the nursing notes.   HISTORY  Chief Complaint Fall and Chest Pain   HPI Manuel Richard is a 46 y.o. male who self presents to the emergency department with sudden onset severe left chest pain that began when he slipped on ice and fell landing on his left chest.  He did not strike his head.  His pain is worse with deep inspiration and improved somewhat with rest.  He has mild shortness of breath.  Past Medical History:  Diagnosis Date  . Anemia   . Chronic back pain   . Chronic pain syndrome   . Chronic pain syndrome   . Chronic sinusitis   . Dyslipidemia   . Epididymitis    Chlamydial, Right  . Erectile dysfunction   . Fatigue   . Groin pain   . High cholesterol   . Hydrocele    bilateral  . Nicotine addiction   . Shortness of breath dyspnea   . Stroke (HCC) 2006   x 2     . Testicular pain     Patient Active Problem List   Diagnosis Date Noted  . Back pain at L4-L5 level 02/15/2015  . DDD (degenerative disc disease), lumbosacral 02/15/2015  . Lumbar radiculopathy 02/15/2015  . Drug-seeking behavior 01/12/2015  . Bilateral hydrocele 01/12/2015  . Bilateral varicoceles 01/12/2015  . Orchitis and epididymitis 12/16/2014  . Epididymitis 11/29/2014  . Microscopic hematuria 11/29/2014    Past Surgical History:  Procedure Laterality Date  . APPENDECTOMY      Prior to Admission medications   Medication Sig Start Date End Date Taking? Authorizing Provider  aspirin EC 81 MG tablet Take 81 mg by mouth daily.    [provider]  atorvastatin (LIPITOR) 40 MG tablet Take 40 mg by mouth daily.    [provider]  buprenorphine (BUTRANS - DOSED MCG/HR) 5 MCG/HR PTWK patch Place 1 patch (5 mcg total) onto the skin once  a week. Patient not taking: Reported on 03/08/2015 02/22/15   Tod Persia, MD  ciprofloxacin (CIPRO) 500 MG tablet Take 1 tablet (500 mg total) by mouth every 12 (twelve) hours. Patient not taking: Reported on 02/15/2015 12/16/14   Harle Battiest, PA-C  CRESTOR 40 MG tablet  01/12/15   [provider]  ferrous sulfate 325 (65 FE) MG EC tablet  12/26/14   [provider]  fluticasone (FLONASE) 50 MCG/ACT nasal spray Place 2 sprays into both nostrils daily.    [provider]  HYDROcodone-acetaminophen (NORCO) 5-325 MG tablet Take 1 tablet by mouth every 6 (six) hours as needed for up to 25 doses for severe pain. 05/07/17   Merrily Brittle, MD  ibuprofen (ADVIL,MOTRIN) 600 MG tablet Take 1 tablet (600 mg total) by mouth every 8 (eight) hours as needed. Patient not taking: Reported on 01/20/2015 12/16/14   Michiel Cowboy A, PA-C  ibuprofen (ADVIL,MOTRIN) 600 MG tablet Take 1 tablet (600 mg total) by mouth every 8 (eight) hours as needed. 05/07/17   Merrily Brittle, MD  oxyCODONE-acetaminophen (ROXICET) 5-325 MG tablet Take 1 tablet by mouth 3 (three) times daily as needed for severe pain. 03/08/15   Tod Persia, MD  vardenafil (LEVITRA) 5 MG tablet Take 5 mg by mouth as needed for erectile dysfunction.    [provider]  varenicline (CHANTIX) 1 MG tablet Take 1 mg by mouth 2 (two) times daily.    [provider]  Vitamin D, Ergocalciferol, (DRISDOL) 50000 UNITS CAPS capsule take 1 capsule by mouth every week for 8 WEEKS. 12/23/14   [provider]    Allergies Patient has no known allergies.  Family History  Problem Relation Age of Onset  . Cancer Mother   . Cancer Father     Social History Social History   Tobacco Use  . Smoking status: Current Every Day Smoker    Packs/day: 2.00    Types: Cigarettes  . Smokeless tobacco: Never Used  Substance Use Topics  . Alcohol use: No    Alcohol/week: 0.0 oz    Comment: no longer  .  Drug use: No    Review of Systems Constitutional: No fever/chills ENT: No sore throat. Cardiovascular: Positive for chest pain. Respiratory: Positive for shortness of breath. Gastrointestinal: No abdominal pain.  No nausea, no vomiting.  No diarrhea.  No constipation. Musculoskeletal: Negative for back pain. Neurological: Negative for headaches   ____________________________________________   PHYSICAL EXAM:  VITAL SIGNS: ED Triage Vitals  Enc Vitals Group     BP 05/06/17 2253 124/81     Pulse Rate 05/06/17 2253 87     Resp 05/06/17 2253 18     Temp 05/06/17 2253 97.7 F (36.5 C)     Temp Source 05/06/17 2253 Oral     SpO2 05/06/17 2253 99 %     Weight 05/06/17 2254 250 lb (113.4 kg)     Height 05/06/17 2254 6' (1.829 m)     Head Circumference --      Peak Flow --      Pain Score 05/06/17 2253 10     Pain Loc --      Pain Edu? --      Excl. in GC? --     Constitutional: Alert and oriented x4 no acute distress well-appearing Head: Atraumatic. Nose: No congestion/rhinnorhea. Mouth/Throat: No trismus Neck: No stridor.   Cardiovascular:  regular rate and rhythm Respiratory: Normal respiratory effort.  No retractions. Gastrointestinal: Soft nontender Neurologic:  Normal speech and language. No gross focal neurologic deficits are appreciated.  Skin:  Skin is warm, dry and intact. No rash noted.    ____________________________________________  LABS (all labs ordered are listed, but only abnormal results are displayed)  Labs Reviewed - No data to display   __________________________________________  EKG   ____________________________________________  RADIOLOGY  Rib x-rays reviewed by me show single nondisplaced left-sided rib fracture with no pneumothorax ____________________________________________   DIFFERENTIAL includes but not limited to  Rib fracture, rib contusion, pneumothorax, hemothorax, pulmonary contusion   PROCEDURES  Procedure(s)  performed: no  Procedures  Critical Care performed: no  Observation: no ____________________________________________   INITIAL IMPRESSION / ASSESSMENT AND PLAN / ED COURSE  Pertinent labs & imaging results that were available during my care of the patient were reviewed by me and considered in my medical decision making (see chart for details).  The patient arrives after a mechanical fall with sudden onset severe left chest pain.  X-ray confirms a nondisplaced left sided wrist fracture with no hemothorax and no pneumothorax.  Pain improved after opioids.  Discharged home with a short course and primary care follow-up.  Strict return precautions have been given patient verbalized understanding agree with plan.      ____________________________________________   FINAL CLINICAL IMPRESSION(S) / ED DIAGNOSES  Final diagnoses:  Closed fracture of one  rib of left side, initial encounter      NEW MEDICATIONS STARTED DURING THIS VISIT:  This SmartLink is deprecated. Use AVSMEDLIST instead to display the medication list for a patient.   Note:  This document was prepared using Dragon voice recognition software and may include unintentional dictation errors.      Merrily Brittleifenbark, Brendi Mccarroll, MD 05/07/17 [phone]

## 2017-05-13 ENCOUNTER — Ambulatory Visit: Payer: Medicare Other | Admitting: Family Medicine

## 2018-03-16 ENCOUNTER — Telehealth: Payer: Self-pay | Admitting: Orthopaedic Surgery

## 2018-03-16 NOTE — Telephone Encounter (Signed)
Patient and spouse had called to inquire about referral from primary care in [place] (said not part of [facility] system), which I relayed, we had not found to be received. Then received call from provider's office, [facility], verified they are not part of [facility] system. York Spaniel will fax referral. Problem is low back pain, history of back surgery 10 years ago.  Relayed to provider and patient that our Dr's will need to review prior to scheduling. Voiced understanding. (ph# [phone].)

## 2018-03-17 ENCOUNTER — Ambulatory Visit (INDEPENDENT_AMBULATORY_CARE_PROVIDER_SITE_OTHER): Payer: Medicare Other | Admitting: Gastroenterology

## 2018-03-17 ENCOUNTER — Other Ambulatory Visit: Payer: Self-pay | Admitting: *Deleted

## 2018-03-17 ENCOUNTER — Encounter: Payer: Self-pay | Admitting: Gastroenterology

## 2018-03-17 ENCOUNTER — Telehealth: Payer: Self-pay | Admitting: *Deleted

## 2018-03-17 VITALS — BP 113/74 | HR 105 | Temp 97.1°F | Ht 72.0 in | Wt 264.8 lb

## 2018-03-17 DIAGNOSIS — R1310 Dysphagia, unspecified: Secondary | ICD-10-CM | POA: Diagnosis not present

## 2018-03-17 DIAGNOSIS — K625 Hemorrhage of anus and rectum: Secondary | ICD-10-CM

## 2018-03-17 MED ORDER — PEG 3350-KCL-NA BICARB-NACL 420 G PO SOLR: 4000.0000 mL | Freq: Once | ORAL | 0 refills | Status: AC

## 2018-03-17 NOTE — Telephone Encounter (Signed)
Pre-op scheduled for 06/02/18 at 11:00am. Letter mailed per pt request.

## 2018-03-17 NOTE — Patient Instructions (Signed)
We have scheduled you for a colonoscopy, upper endoscopy, and dilation with Dr. Fields in the near future.  Further recommendations to follow!  It was a pleasure to see you today. I strive to create trusting relationships with patients to provide genuine, compassionate, and quality care. I value your feedback. If you receive a survey regarding your visit,  I greatly appreciate you taking time to fill this out.   Maciah Feeback W. Darrold Bezek, PhD, ANP-BC [facility]    

## 2018-03-17 NOTE — Telephone Encounter (Signed)
Patient scheduled for TCS/EGD/ED W/ PROPOFOL on 06/09/17 at 7:30am. Patient left without instructions and stated to mail him everything and left the office.

## 2018-03-17 NOTE — Progress Notes (Addendum)
REVIEWED-NO ADDITIONAL RECOMMENDATIONS.  Primary Care Physician:  Martin Majestic, FNP Primary Gastroenterologist:  Dr. Oneida Alar   Chief Complaint  Patient presents with  . Rectal Bleeding    never had tcs    HPI:   Manuel Richard is a 47 y.o. male presenting today at the request of his PCP for colonoscopy, found to be heme positive. Previously seen by Dr. Arther Dames with GI in 2016 for IDA; patient no-showed EGD and colonoscopy and was not interested in rescheduling.   He is present with his wife and two daughters. Difficult to obtain history due to multiple family members talking. Notes chronic abdominal pain. No constipation or diarrhea. Denies GERD symptoms. Forces himself to eat at times. Notes solid food dysphagia and having to drink water "to get food down". Had not had a primary care provider for over 5 years until recently.    Past Medical History:  Diagnosis Date  . Anemia   . Chronic back pain   . Chronic pain syndrome   . Chronic pain syndrome   . Chronic sinusitis   . Dyslipidemia   . Epididymitis    Chlamydial, Right  . Erectile dysfunction   . Fatigue   . Groin pain   . High cholesterol   . Hydrocele    bilateral  . Nicotine addiction   . Shortness of breath dyspnea   . Stroke ([place]) 2006   x 2     . Testicular pain     Past Surgical History:  Procedure Laterality Date  . APPENDECTOMY      Current Outpatient Medications  Medication Sig Dispense Refill  . aspirin EC 81 MG tablet Take 81 mg by mouth daily.    Marland Kitchen atorvastatin (LIPITOR) 20 MG tablet Take 1 tablet by mouth daily.  5  . atorvastatin (LIPITOR) 40 MG tablet Take 40 mg by mouth daily.    . sildenafil (REVATIO) 20 MG tablet Take 5 tablets by mouth as needed.  11  . Vitamin D, Ergocalciferol, (DRISDOL) 50000 UNITS CAPS capsule every 7 (seven) days.   0   No current facility-administered medications for this visit.     Allergies as of 03/17/2018  . (No Known Allergies)    Family  History  Problem Relation Age of Onset  . Cancer Mother   . Cancer Father   . Colon cancer Neg Hx   . Colon polyps Neg Hx     Social History   Socioeconomic History  . Marital status: Married    Spouse name: Not on file  . Number of children: Not on file  . Years of education: Not on file  . Highest education level: Not on file  Occupational History  . Not on file  Social Needs  . Financial resource strain: Not on file  . Food insecurity:    Worry: Not on file    Inability: Not on file  . Transportation needs:    Medical: Not on file    Non-medical: Not on file  Tobacco Use  . Smoking status: Current Every Day Smoker    Packs/day: 2.00    Types: Cigarettes  . Smokeless tobacco: Never Used  Substance and Sexual Activity  . Alcohol use: No    Alcohol/week: 0.0 standard drinks    Comment: no longer  . Drug use: No  . Sexual activity: Not on file  Lifestyle  . Physical activity:    Days per week: Not on file    Minutes per session:  Not on file  . Stress: Not on file  Relationships  . Social connections:    Talks on phone: Not on file    Gets together: Not on file    Attends religious service: Not on file    Active member of club or organization: Not on file    Attends meetings of clubs or organizations: Not on file    Relationship status: Not on file  . Intimate partner violence:    Fear of current or ex partner: Not on file    Emotionally abused: Not on file    Physically abused: Not on file    Forced sexual activity: Not on file  Other Topics Concern  . Not on file  Social History Narrative  . Not on file    Review of Systems: Gen: see HPI  CV: Denies chest pain, heart palpitations, peripheral edema, syncope.  Resp: Denies shortness of breath at rest or with exertion. Denies wheezing or cough.  GI: see HPI  GU : Denies urinary burning, urinary frequency, urinary hesitancy MS: +Joint pain  Derm: Denies rash, itching, dry skin Psych: Denies depression,  anxiety, memory loss, and confusion Heme: see HPI   Physical Exam: BP 113/74   Pulse (!) 105   Temp (!) 97.1 F (36.2 C) (Oral)   Ht 6' (1.829 m)   Wt 264 lb 12.8 oz (120.1 kg)   BMI 35.91 kg/m  General:   Alert and oriented. Pleasant and cooperative. Well-nourished and well-developed. Animated, raising himself up out of the chair and stating "you better open that door, I just farted".  Head:  Normocephalic and atraumatic. Eyes:  Without icterus, sclera clear and conjunctiva pink.  Ears:  Normal auditory acuity. Nose:  No deformity, discharge,  or lesions. Mouth:  No deformity or lesions Lungs:  Clear to auscultation bilaterally.  Heart:  S1, S2 present without murmurs appreciated.  Abdomen:  +BS, soft, non-tender and non-distended. No HSM noted. No guarding or rebound. No masses appreciated.  Rectal:  Deferred  Msk:  Symmetrical without gross deformities. Normal posture. Extremities:  Without edema. Neurologic:  Alert and  oriented x4 Skin:  Intact without significant lesions or rashes. Psych:  Alert and cooperative. Normal mood and affect.  Outside labs Oct 2019: Hgb 14.2, Hct 43, Platelet 299, Alk Phos 107, ALT 12, AST 11, Tbili 0.4

## 2018-03-26 ENCOUNTER — Encounter: Payer: Self-pay | Admitting: Gastroenterology

## 2018-03-26 NOTE — Assessment & Plan Note (Signed)
47 year old male with history of rectal bleeding, heme positive stool, no prior colonoscopy/EGD. No other concerning lower GI signs/symptoms.   Proceed with colonoscopy with Dr. Darrick Penna in the near future. The risks, benefits, and alternatives have been discussed in detail with the patient. They state understanding and desire to proceed.  Propofol due to polypharmacy

## 2018-03-26 NOTE — Assessment & Plan Note (Signed)
No prior EGD. Denying typical GERD. Solid food dysphagia. Will pursue EGD/dilation at time of TCS.  Proceed with upper endoscopy/dilation in the near future with Dr. Darrick Penna. The risks, benefits, and alternatives have been discussed in detail with patient. They have stated understanding and desire to proceed.  Propofol due to polypharmacy

## 2018-03-27 NOTE — Progress Notes (Signed)
CC'D TO PCP °

## 2018-04-02 ENCOUNTER — Ambulatory Visit (INDEPENDENT_AMBULATORY_CARE_PROVIDER_SITE_OTHER): Payer: Medicare Other

## 2018-04-02 ENCOUNTER — Encounter: Payer: Self-pay | Admitting: Orthopaedic Surgery

## 2018-04-02 ENCOUNTER — Ambulatory Visit (INDEPENDENT_AMBULATORY_CARE_PROVIDER_SITE_OTHER): Payer: Medicare Other | Admitting: Orthopaedic Surgery

## 2018-04-02 VITALS — Ht 72.0 in | Wt 262.0 lb

## 2018-04-02 DIAGNOSIS — M545 Low back pain, unspecified: Secondary | ICD-10-CM

## 2018-04-02 DIAGNOSIS — G8929 Other chronic pain: Secondary | ICD-10-CM | POA: Diagnosis not present

## 2018-04-02 DIAGNOSIS — G894 Chronic pain syndrome: Secondary | ICD-10-CM | POA: Diagnosis not present

## 2018-04-02 DIAGNOSIS — F17210 Nicotine dependence, cigarettes, uncomplicated: Secondary | ICD-10-CM

## 2018-04-02 DIAGNOSIS — M5442 Lumbago with sciatica, left side: Secondary | ICD-10-CM | POA: Diagnosis not present

## 2018-04-02 NOTE — Progress Notes (Signed)
Subjective:    Patient ID: Manuel Richard, male    DOB: 31-Dec-1970, 47 y.o.   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  He says everyday is very painful for him.  He has not been able to work since then.  He did not get Financial risk analyst.  He has had x-rays in the past, MRI in the past, none in the last several years.  He is followed by his family doctor, [facility] in [place], and I have read the notes.  Last MRI was in 2012 and was negative.  I have notes also from [facility] in 2017.  He has pain down the left leg to the knee area and not below.  He has no weakness.  He has spasm at times he says.  He has no bowel or bladder problem.    Nothing seems to help.  He has tried NSAIDs, muscle relaxants, heat, ice, rubs, PT.  He says pain medicine helps some.  He is on no pain medicine at the present time.  He has no new trauma.    His family accompanies him today.   Review of Systems  Constitutional: Positive for activity change.  Respiratory: Positive for shortness of breath. Negative for cough.   Musculoskeletal: Positive for arthralgias, back pain and myalgias.  All other systems reviewed and are negative.  For Review of Systems, all other systems reviewed and are negative.  The following is a summary of the past history medically, past history surgically, known current medicines, social history and family history.  This information is gathered electronically by the computer from prior information and documentation.  I review this each visit and have found including this information at this point in the chart is beneficial and informative.   Past Medical History:  Diagnosis Date  . Anemia   . Chronic back pain   . Chronic pain syndrome   . Chronic pain syndrome   . Chronic sinusitis   . Dyslipidemia   . Epididymitis    Chlamydial, Right  . Erectile  dysfunction   . Fatigue   . Groin pain   . High cholesterol   . Hydrocele    bilateral  . Nicotine addiction   . Shortness of breath dyspnea   . Stroke (HCC) 2006   x 2     . Testicular pain     Past Surgical History:  Procedure Laterality Date  . APPENDECTOMY      Current Outpatient Medications on File Prior to Visit  Medication Sig Dispense Refill  . aspirin EC 81 MG tablet Take 81 mg by mouth daily.    Marland Kitchen atorvastatin (LIPITOR) 20 MG tablet Take 1 tablet by mouth daily.  5  . furosemide (LASIX) 20 MG tablet TK 1 T PO QD PRN    . sildenafil (REVATIO) 20 MG tablet Take 5 tablets by mouth as needed.  11  . cyclobenzaprine (FLEXERIL) 10 MG tablet TK 1 T PO TID    . gabapentin (NEURONTIN) 600 MG tablet TK 1 T PO BID    . Vitamin D, Ergocalciferol, (DRISDOL) 50000 UNITS CAPS capsule every 7 (seven) days.   0   No current facility-administered medications on file prior to visit.     Social History   Socioeconomic History  . Marital status: Married    Spouse name:  Not on file  . Number of children: Not on file  . Years of education: Not on file  . Highest education level: Not on file  Occupational History  . Not on file  Social Needs  . Financial resource strain: Not on file  . Food insecurity:    Worry: Not on file    Inability: Not on file  . Transportation needs:    Medical: Not on file    Non-medical: Not on file  Tobacco Use  . Smoking status: Current Every Day Smoker    Packs/day: 2.00    Types: Cigarettes  . Smokeless tobacco: Never Used  Substance and Sexual Activity  . Alcohol use: No    Alcohol/week: 0.0 standard drinks    Comment: no longer  . Drug use: No  . Sexual activity: Not on file  Lifestyle  . Physical activity:    Days per week: Not on file    Minutes per session: Not on file  . Stress: Not on file  Relationships  . Social connections:    Talks on phone: Not on file    Gets together: Not on file    Attends religious service: Not on  file    Active member of club or organization: Not on file    Attends meetings of clubs or organizations: Not on file    Relationship status: Not on file  . Intimate partner violence:    Fear of current or ex partner: Not on file    Emotionally abused: Not on file    Physically abused: Not on file    Forced sexual activity: Not on file  Other Topics Concern  . Not on file  Social History Narrative  . Not on file    Family History  Problem Relation Age of Onset  . Cancer Mother   . Cancer Father   . Colon cancer Neg Hx   . Colon polyps Neg Hx     Ht 6' (1.829 m)   Wt 262 lb (118.8 kg)   BMI 35.53 kg/m   Body mass index is 35.53 kg/m.     Objective:   Physical Exam  Constitutional: He is oriented to person, place, and time. He appears well-developed and well-nourished.  Poor hygiene.  HENT:  Head: Normocephalic and atraumatic.  Eyes: Pupils are equal, round, and reactive to light. Conjunctivae and EOM are normal.  Neck: Normal range of motion. Neck supple.  Cardiovascular: Normal rate, regular rhythm and intact distal pulses.  Pulmonary/Chest: Effort normal.  Abdominal: Soft.  Musculoskeletal:       Back:  Neurological: He is alert and oriented to person, place, and time. He has normal reflexes. He displays normal reflexes. No cranial nerve deficit. He exhibits normal muscle tone. Coordination normal.  Skin: Skin is warm and dry.  Psychiatric: He has a normal mood and affect. His behavior is normal. Judgment and thought content normal.     X-rays were done of the lumbar spine, reported separately.     Assessment & Plan:   Encounter Diagnoses  Name Primary?  . Chronic midline low back pain with left-sided sciatica Yes  . Cigarette nicotine dependence without complication   . Chronic pain syndrome    I will get a MRI of the lumbar spine.  I have not added any medicines today.  Return after the MRI.  Call if any problem.  Precautions  discussed.   Electronically Signed Darreld Mclean, MD 11/7/20198:48 AM

## 2018-04-02 NOTE — Patient Instructions (Signed)
Steps to Quit Smoking Smoking tobacco can be bad for your health. It can also affect almost every organ in your body. Smoking puts you and people around you at risk for many serious long-lasting (chronic) diseases. Quitting smoking is hard, but it is one of the best things that you can do for your health. It is never too late to quit. What are the benefits of quitting smoking? When you quit smoking, you lower your risk for getting serious diseases and conditions. They can include:  Lung cancer or lung disease.  Heart disease.  Stroke.  Heart attack.  Not being able to have children (infertility).  Weak bones (osteoporosis) and broken bones (fractures).  If you have coughing, wheezing, and shortness of breath, those symptoms may get better when you quit. You may also get sick less often. If you are pregnant, quitting smoking can help to lower your chances of having a baby of low birth weight. What can I do to help me quit smoking? Talk with your doctor about what can help you quit smoking. Some things you can do (strategies) include:  Quitting smoking totally, instead of slowly cutting back how much you smoke over a period of time.  Going to in-person counseling. You are more likely to quit if you go to many counseling sessions.  Using resources and support systems, such as: ? Online chats with a counselor. ? Phone quitlines. ? Printed self-help materials. ? Support groups or group counseling. ? Text messaging programs. ? Mobile phone apps or applications.  Taking medicines. Some of these medicines may have nicotine in them. If you are pregnant or breastfeeding, do not take any medicines to quit smoking unless your doctor says it is okay. Talk with your doctor about counseling or other things that can help you.  Talk with your doctor about using more than one strategy at the same time, such as taking medicines while you are also going to in-person counseling. This can help make  quitting easier. What things can I do to make it easier to quit? Quitting smoking might feel very hard at first, but there is a lot that you can do to make it easier. Take these steps:  Talk to your family and friends. Ask them to support and encourage you.  Call phone quitlines, reach out to support groups, or work with a counselor.  Ask people who smoke to not smoke around you.  Avoid places that make you want (trigger) to smoke, such as: ? Bars. ? Parties. ? Smoke-break areas at work.  Spend time with people who do not smoke.  Lower the stress in your life. Stress can make you want to smoke. Try these things to help your stress: ? Getting regular exercise. ? Deep-breathing exercises. ? Yoga. ? Meditating. ? Doing a body scan. To do this, close your eyes, focus on one area of your body at a time from head to toe, and notice which parts of your body are tense. Try to relax the muscles in those areas.  Download or buy apps on your mobile phone or tablet that can help you stick to your quit plan. There are many free apps, such as QuitGuide from the CDC (Centers for Disease Control and Prevention). You can find more support from smokefree.gov and other websites.  This information is not intended to replace advice given to you by your health care provider. Make sure you discuss any questions you have with your health care provider. Document Released: 03/09/2009 Document   Revised: 01/09/2016 Document Reviewed: 09/27/2014 Elsevier Interactive Patient Education  2018 [employer].  

## 2018-04-12 ENCOUNTER — Other Ambulatory Visit: Payer: Medicare Other

## 2018-04-20 ENCOUNTER — Ambulatory Visit: Payer: Medicare Other | Attending: Nurse Practitioner | Admitting: Nurse Practitioner

## 2018-04-20 ENCOUNTER — Other Ambulatory Visit: Payer: Self-pay

## 2018-04-20 ENCOUNTER — Encounter: Payer: Self-pay | Admitting: Nurse Practitioner

## 2018-04-20 VITALS — BP 125/85 | HR 86 | Temp 97.5°F | Ht 72.0 in | Wt 260.0 lb

## 2018-04-20 DIAGNOSIS — J329 Chronic sinusitis, unspecified: Secondary | ICD-10-CM | POA: Diagnosis not present

## 2018-04-20 DIAGNOSIS — M899 Disorder of bone, unspecified: Secondary | ICD-10-CM

## 2018-04-20 DIAGNOSIS — Z8673 Personal history of transient ischemic attack (TIA), and cerebral infarction without residual deficits: Secondary | ICD-10-CM | POA: Diagnosis not present

## 2018-04-20 DIAGNOSIS — M5137 Other intervertebral disc degeneration, lumbosacral region: Secondary | ICD-10-CM | POA: Insufficient documentation

## 2018-04-20 DIAGNOSIS — N529 Male erectile dysfunction, unspecified: Secondary | ICD-10-CM | POA: Insufficient documentation

## 2018-04-20 DIAGNOSIS — M533 Sacrococcygeal disorders, not elsewhere classified: Secondary | ICD-10-CM | POA: Insufficient documentation

## 2018-04-20 DIAGNOSIS — E559 Vitamin D deficiency, unspecified: Secondary | ICD-10-CM | POA: Insufficient documentation

## 2018-04-20 DIAGNOSIS — Z9889 Other specified postprocedural states: Secondary | ICD-10-CM | POA: Diagnosis not present

## 2018-04-20 DIAGNOSIS — M545 Low back pain: Secondary | ICD-10-CM | POA: Diagnosis present

## 2018-04-20 DIAGNOSIS — Z79891 Long term (current) use of opiate analgesic: Secondary | ICD-10-CM

## 2018-04-20 DIAGNOSIS — G894 Chronic pain syndrome: Secondary | ICD-10-CM

## 2018-04-20 DIAGNOSIS — E785 Hyperlipidemia, unspecified: Secondary | ICD-10-CM | POA: Diagnosis not present

## 2018-04-20 DIAGNOSIS — Z7982 Long term (current) use of aspirin: Secondary | ICD-10-CM | POA: Insufficient documentation

## 2018-04-20 DIAGNOSIS — M79605 Pain in left leg: Secondary | ICD-10-CM

## 2018-04-20 DIAGNOSIS — M5442 Lumbago with sciatica, left side: Secondary | ICD-10-CM

## 2018-04-20 DIAGNOSIS — F17210 Nicotine dependence, cigarettes, uncomplicated: Secondary | ICD-10-CM | POA: Insufficient documentation

## 2018-04-20 DIAGNOSIS — D649 Anemia, unspecified: Secondary | ICD-10-CM | POA: Insufficient documentation

## 2018-04-20 DIAGNOSIS — Z79899 Other long term (current) drug therapy: Secondary | ICD-10-CM

## 2018-04-20 DIAGNOSIS — G8929 Other chronic pain: Secondary | ICD-10-CM | POA: Insufficient documentation

## 2018-04-20 DIAGNOSIS — M4726 Other spondylosis with radiculopathy, lumbar region: Secondary | ICD-10-CM | POA: Insufficient documentation

## 2018-04-20 DIAGNOSIS — Z789 Other specified health status: Secondary | ICD-10-CM | POA: Insufficient documentation

## 2018-04-20 NOTE — Patient Instructions (Addendum)
____________________________________________________________________________________________  Appointment Policy Summary  It is our goal and responsibility to provide the medical community with assistance in the evaluation and management of patients with chronic pain. Unfortunately our resources are limited. Because we do not have an unlimited amount of time, or available appointments, we are required to closely monitor and manage their use. The following rules exist to maximize their use:  Patient's responsibilities: 1. Punctuality:  At what time should I arrive? You should be physically present in our office 30 minutes before your scheduled appointment. Your scheduled appointment is with your assigned healthcare provider. However, it takes 5-10 minutes to be "checked-in", and another 15 minutes for the nurses to do the admission. If you arrive to our office at the time you were given for your appointment, you will end up being at least 20-25 minutes late to your appointment with the provider. 2. Tardiness:  What happens if I arrive only a few minutes after my scheduled appointment time? You will need to reschedule your appointment. The cutoff is your appointment time. This is why it is so important that you arrive at least 30 minutes before that appointment. If you have an appointment scheduled for 10:00 AM and you arrive at 10:01, you will be required to reschedule your appointment.  3. Plan ahead:  Always assume that you will encounter traffic on your way in. Plan for it. If you are dependent on a driver, make sure they understand these rules and the need to arrive early. 4. Other appointments and responsibilities:  Avoid scheduling any other appointments before or after your pain clinic appointments.  5. Be prepared:  Write down everything that you need to discuss with your healthcare provider and give this information to the admitting nurse. Write down the medications that you will need  refilled. Bring your pills and bottles (even the empty ones), to all of your appointments, except for those where a procedure is scheduled. 6. No children or pets:  Find someone to take care of them. It is not appropriate to bring them in. 7. Scheduling changes:  We request "advanced notification" of any changes or cancellations. 8. Advanced notification:  Defined as a time period of more than 24 hours prior to the originally scheduled appointment. This allows for the appointment to be offered to other patients. 9. Rescheduling:  When a visit is rescheduled, it will require the cancellation of the original appointment. For this reason they both fall within the category of "Cancellations".  10. Cancellations:  They require advanced notification. Any cancellation less than 24 hours before the  appointment will be recorded as a "No Show". 11. No Show:  Defined as an unkept appointment where the patient failed to notify or declare to the practice their intention or inability to keep the appointment.  Corrective process for repeat offenders:  1. Tardiness: Three (3) episodes of rescheduling due to late arrivals will be recorded as one (1) "No Show". 2. Cancellation or reschedule: Three (3) cancellations or rescheduling will be recorded as one (1) "No Show". 3. "No Shows": Three (3) "No Shows" within a 12 month period will result in discharge from the practice. ____________________________________________________________________________________________   ______________________________________________________________________________________________  Specialty Pain Scale  Introduction:  There are significant differences in how pain is reported. The word pain usually refers to physical pain, but it is also a common synonym of suffering. The medical community uses a scale from 0 (zero) to 10 (ten) to report pain level. Zero (0) is described as "no pain",   while ten (10) is described as "the worse pain  you can imagine". The problem with this scale is that physical pain is reported along with suffering. Suffering refers to mental pain, or more often yet it refers to any unpleasant feeling, emotion or aversion associated with the perception of harm or threat of harm. It is the psychological component of pain.  Pain Specialists prefer to separate the two components. The pain scale used by this practice is the Verbal Numerical Rating Scale (VNRS-11). This scale is for the physical pain only. DO NOT INCLUDE how your pain psychologically affects you. This scale is for adults 21 years of age and older. It has 11 (eleven) levels. The 1st level is 0/10. This means: "right now, I have no pain". In the context of pain management, it also means: "right now, my physical pain is under control with the current therapy".  General Information:  The scale should reflect your current level of pain. Unless you are specifically asked for the level of your worst pain, or your average pain. If you are asked for one of these two, then it should be understood that it is over the past 24 hours.  Levels 1 (one) through 5 (five) are described below, and can be treated as an outpatient. Ambulatory pain management facilities such as ours are more than adequate to treat these levels. Levels 6 (six) through 10 (ten) are also described below, however, these must be treated as a hospitalized patient. While levels 6 (six) and 7 (seven) may be evaluated at an urgent care facility, levels 8 (eight) through 10 (ten) constitute medical emergencies and as such, they belong in a hospital's emergency department. When having these levels (as described below), do not come to our office. Our facility is not equipped to manage these levels. Go directly to an urgent care facility or an emergency department to be evaluated.  Definitions:  Activities of Daily Living (ADL): Activities of daily living (ADL or ADLs) is a term used in healthcare to refer to  people's daily self-care activities. Health professionals often use a person's ability or inability to perform ADLs as a measurement of their functional status, particularly in regard to people post injury, with disabilities and the elderly. There are two ADL levels: Basic and Instrumental. Basic Activities of Daily Living (BADL  or BADLs) consist of self-care tasks that include: Bathing and showering; personal hygiene and grooming (including brushing/combing/styling hair); dressing; Toilet hygiene (getting to the toilet, cleaning oneself, and getting back up); eating and self-feeding (not including cooking or chewing and swallowing); functional mobility, often referred to as "transferring", as measured by the ability to walk, get in and out of bed, and get into and out of a chair; the broader definition (moving from one place to another while performing activities) is useful for people with different physical abilities who are still able to get around independently. Basic ADLs include the things many people do when they get up in the morning and get ready to go out of the house: get out of bed, go to the toilet, bathe, dress, groom, and eat. On the average, loss of function typically follows a particular order. Hygiene is the first to go, followed by loss of toilet use and locomotion. The last to go is the ability to eat. When there is only one remaining area in which the person is independent, there is a 62.9% chance that it is eating and only a 3.5% chance that it is hygiene. Instrumental Activities   of Daily Living (IADL or IADLs) are not necessary for fundamental functioning, but they let an individual live independently in a community. IADL consist of tasks that include: cleaning and maintaining the house; home establishment and maintenance; care of others (including selecting and supervising caregivers); care of pets; child rearing; managing money; managing financials (investments, etc.); meal preparation  and cleanup; shopping for groceries and necessities; moving within the community; safety procedures and emergency responses; health management and maintenance (taking prescribed medications); and using the telephone or other form of communication.  Instructions:  Most patients tend to report their pain as a combination of two factors, their physical pain and their psychosocial pain. This last one is also known as "suffering" and it is reflection of how physical pain affects you socially and psychologically. From now on, report them separately.  From this point on, when asked to report your pain level, report only your physical pain. Use the following table for reference.  Pain Clinic Pain Levels (0-5/10)  Pain Level Score  Description  No Pain 0   Mild pain 1 Nagging, annoying, but does not interfere with basic activities of daily living (ADL). Patients are able to eat, bathe, get dressed, toileting (being able to get on and off the toilet and perform personal hygiene functions), transfer (move in and out of bed or a chair without assistance), and maintain continence (able to control bladder and bowel functions). Blood pressure and heart rate are unaffected. A normal heart rate for a healthy adult ranges from 60 to 100 bpm (beats per minute).   Mild to moderate pain 2 Noticeable and distracting. Impossible to hide from other people. More frequent flare-ups. Still possible to adapt and function close to normal. It can be very annoying and may have occasional stronger flare-ups. With discipline, patients may get used to it and adapt.   Moderate pain 3 Interferes significantly with activities of daily living (ADL). It becomes difficult to feed, bathe, get dressed, get on and off the toilet or to perform personal hygiene functions. Difficult to get in and out of bed or a chair without assistance. Very distracting. With effort, it can be ignored when deeply involved in activities.   Moderately severe pain  4 Impossible to ignore for more than a few minutes. With effort, patients may still be able to manage work or participate in some social activities. Very difficult to concentrate. Signs of autonomic nervous system discharge are evident: dilated pupils (mydriasis); mild sweating (diaphoresis); sleep interference. Heart rate becomes elevated (>115 bpm). Diastolic blood pressure (lower number) rises above 100 mmHg. Patients find relief in laying down and not moving.   Severe pain 5 Intense and extremely unpleasant. Associated with frowning face and frequent crying. Pain overwhelms the senses.  Ability to do any activity or maintain social relationships becomes significantly limited. Conversation becomes difficult. Pacing back and forth is common, as getting into a comfortable position is nearly impossible. Pain wakes you up from deep sleep. Physical signs will be obvious: pupillary dilation; increased sweating; goosebumps; brisk reflexes; cold, clammy hands and feet; nausea, vomiting or dry heaves; loss of appetite; significant sleep disturbance with inability to fall asleep or to remain asleep. When persistent, significant weight loss is observed due to the complete loss of appetite and sleep deprivation.  Blood pressure and heart rate becomes significantly elevated. Caution: If elevated blood pressure triggers a pounding headache associated with blurred vision, then the patient should immediately seek attention at an urgent or emergency care unit, as   these may be signs of an impending stroke.    Emergency Department Pain Levels (6-10/10)  Emergency Room Pain 6 Severely limiting. Requires emergency care and should not be seen or managed at an outpatient pain management facility. Communication becomes difficult and requires great effort. Assistance to reach the emergency department may be required. Facial flushing and profuse sweating along with potentially dangerous increases in heart rate and blood pressure  will be evident.   Distressing pain 7 Self-care is very difficult. Assistance is required to transport, or use restroom. Assistance to reach the emergency department will be required. Tasks requiring coordination, such as bathing and getting dressed become very difficult.   Disabling pain 8 Self-care is no longer possible. At this level, pain is disabling. The individual is unable to do even the most "basic" activities such as walking, eating, bathing, dressing, transferring to a bed, or toileting. Fine motor skills are lost. It is difficult to think clearly.   Incapacitating pain 9 Pain becomes incapacitating. Thought processing is no longer possible. Difficult to remember your own name. Control of movement and coordination are lost.   The worst pain imaginable 10 At this level, most patients pass out from pain. When this level is reached, collapse of the autonomic nervous system occurs, leading to a sudden drop in blood pressure and heart rate. This in turn results in a temporary and dramatic drop in blood flow to the brain, leading to a loss of consciousness. Fainting is one of the body's self defense mechanisms. Passing out puts the brain in a calmed state and causes it to shut down for a while, in order to begin the healing process.    Summary: 1. Refer to this scale when providing us with your pain level. 2. Be accurate and careful when reporting your pain level. This will help with your care. 3. Over-reporting your pain level will lead to loss of credibility. 4. Even a level of 1/10 means that there is pain and will be treated at our facility. 5. High, inaccurate reporting will be documented as "Symptom Exaggeration", leading to loss of credibility and suspicions of possible secondary gains such as obtaining more narcotics, or wanting to appear disabled, for fraudulent reasons. 6. Only pain levels of 5 or below will be seen at our facility. 7. Pain levels of 6 and above will be sent to the  Emergency Department and the appointment cancelled. ______________________________________________________________________________________________   BMI Assessment: Estimated body mass index is 35.26 kg/m as calculated from the following:   Height as of this encounter: 6' (1.829 m).   Weight as of this encounter: 260 lb (117.9 kg).  BMI interpretation table: BMI level Category Range association with higher incidence of chronic pain  <18 kg/m2 Underweight   18.5-24.9 kg/m2 Ideal body weight   25-29.9 kg/m2 Overweight Increased incidence by 20%  30-34.9 kg/m2 Obese (Class I) Increased incidence by 68%  35-39.9 kg/m2 Severe obesity (Class II) Increased incidence by 136%  >40 kg/m2 Extreme obesity (Class III) Increased incidence by 254%   BMI Readings from Last 4 Encounters:  04/20/18 35.26 kg/m  04/02/18 35.53 kg/m  03/17/18 35.91 kg/m  05/06/17 33.91 kg/m   Wt Readings from Last 4 Encounters:  04/20/18 260 lb (117.9 kg)  04/02/18 262 lb (118.8 kg)  03/17/18 264 lb 12.8 oz (120.1 kg)  05/06/17 250 lb (113.4 kg)

## 2018-04-20 NOTE — Progress Notes (Signed)
Patient's Name: Manuel Richard  MRN: [id number]  Referring Provider: Martin Majestic, *  DOB: 24-Feb-1971  PCP: Martin Majestic, FNP  DOS: 04/20/2018  Note by: Dionisio David NP  Service setting: Ambulatory outpatient  Specialty: Interventional Pain Management  Location: [facility] (AMB) Pain Management Facility    Patient type: New Patient    Primary Reason(s) for Visit: Initial Patient Evaluation CC: Back Pain  HPI  [name] is a 47 y.o. year old, male patient, who comes today for an initial evaluation. He has Epididymitis; Microscopic hematuria; Orchitis and epididymitis; Drug-seeking behavior; Bilateral hydrocele; Bilateral varicoceles; Back pain at L4-L5 level; DDD (degenerative disc disease), lumbosacral; Lumbar radiculopathy; Rectal bleeding; Dysphagia; Anemia; Chronic midline low back pain without sciatica; Chronic pain syndrome; Dyslipidemia; Vitamin D deficiency; Chronic bilateral low back pain with left-sided sciatica (Primary Area of Pain) ; Chronic pain of left lower extremity (Secondary Area of Pain); Pharmacologic therapy; Disorder of skeletal system; Problems influencing health status; Long term current use of opiate analgesic; and Chronic left sacroiliac joint pain on their problem list.. His primarily concern today is the Back Pain  Pain Assessment: Location: Left Back Radiating: pain radiaties down left leg Onset: More than a month ago Duration: Chronic pain Quality: Throbbing, Stabbing, Aching Severity: 10-Worst pain ever/10 (subjective, self-reported pain score)  Note: Reported level is compatible with observation. Clinically the patient looks like a 1/10 A 1/10 is viewed as "Mild" and described as nagging, annoying, but not interfering with basic activities of daily living (ADL). [name] is able to eat, bathe, get dressed, do toileting (being able to get on and off the toilet and perform personal hygiene functions), transfer (move in and out of bed or a chair without  assistance), and maintain continence (able to control bladder and bowel functions). Physiologic parameters such as blood pressure and heart rate apear wnl. Information on the proper use of the pain scale provided to the patient today. When using our objective Pain Scale, levels between 6 and 10/10 are said to belong in an emergency room, as it progressively worsens from a 6/10, described as severely limiting, requiring emergency care not usually available at an outpatient pain management facility. At a 6/10 level, communication becomes difficult and requires great effort. Assistance to reach the emergency department may be required. Facial flushing and profuse sweating along with potentially dangerous increases in heart rate and blood pressure will be evident. Effect on ADL: unable to do anything, unable to drive, walking around little Timing: Constant Modifying factors: nothing BP: 125/85  HR: 86  Onset and Duration: Date of onset: 05/2002 Cause of pain: Work related accident or event Severity: Getting worse, NAS-11 at its worse: 10/10, NAS-11 at its best: 8/10, NAS-11 now: 10/10 and NAS-11 on the average: 9/10 Timing: Noon, Afternoon, Evening, During activity or exercise, After activity or exercise and After a period of immobility Aggravating Factors: Bending, Climbing, Intercourse (sex), Kneeling, Lifiting, Motion, Prolonged sitting, Prolonged standing, Squatting, Stooping , Twisting, Walking, Walking uphill and Walking downhill Alleviating Factors: Medications Associated Problems: Numbness, Swelling, Tingling, Weakness, Pain that wakes patient up and Pain that does not allow patient to sleep Quality of Pain: Sharp, Shooting, Stabbing, Throbbing and Uncomfortable Previous Examinations or Tests: MRI scan and Nerve conduction test Previous Treatments: The patient denies any previous treatment   The patient comes into the clinics today for the first time for a chronic pain management evaluation.   According to the patient's primary area of pain is in his lower  back.  He admits that this pain has been going on approximately 15 years from a work-related accident.  He denies any previous surgery.  He did have interventional therapy once while in [place] but states that it was not effective.  He denies any recent physical therapy labs completed 2005.  He has had recent x-rays and has an MRI pending.  His second area pain is in his left leg.  He admits that it goes down the whole leg to the bottom of his foot.  He denies any problems on the right.  He does have numbness tingling and weakness.  He admits that he did have a nerve conduction study completed at [facility].  Today I took the time to provide the patient with information regarding this pain practice. The patient was informed that the practice is divided into two sections: an interventional pain management section, as well as a completely separate and distinct medication management section. I explained that there are procedure days for interventional therapies, and evaluation days for follow-ups and medication management. Because of the amount of documentation required during both, they are kept separated. This means that there is the possibility that he may be scheduled for a procedure on one day, and medication management the next. I have also informed him that because of staffing and facility limitations, this practice will no longer take patients for medication management only. To illustrate the reasons for this, I gave the patient the example of surgeons, and how inappropriate it would be to refer a patient to his/her care, just to write for the post-surgical antibiotics on a surgery done by a different surgeon.   Because interventional pain management is part of the board-certified specialty for the doctors, the patient was informed that joining this practice means that they are open to any and all interventional therapies. I made it  clear that this does not mean that they will be forced to have any procedures done. What this means is that I believe interventional therapies to be essential part of the diagnosis and proper management of chronic pain conditions. Therefore, patients not interested in these interventional alternatives will be better served under the care of a different practitioner.  The patient was also made aware of my Comprehensive Pain Management Safety Guidelines where by joining this practice, they limit all of their nerve blocks and joint injections to those done by our practice, for as long as we are retained to manage their care. Historic Controlled Substance Pharmacotherapy Review  PMP and historical list of controlled substances: Hydrocodone/acetaminophen 5/325, oxycodone/acetaminophen 5/325 mg, oxycodone 10 mg, oxycodone 5 mg, Highest opioid analgesic regimen found: Oxycodone 10 mg 1-2 tablets 2 to 3 hours for 4 days (fill date 01/08/2014) oxycodone 70 to 80 mg/day Most recent opioid analgesic: None Current opioid analgesics: None Highest recorded MME/day: 112.5 mg/day MME/day: 0 mg/day Medications: The patient did not bring the medication(s) to the appointment, as requested in our "New Patient Package" Pharmacodynamics: Desired effects: Analgesia: The patient reports >50% benefit. Reported improvement in function: The patient reports medication allows him to accomplish basic ADLs. Clinically meaningful improvement in function (CMIF): Sustained CMIF goals met Perceived effectiveness: Described as relatively effective, allowing for increase in activities of daily living (ADL) Undesirable effects: Side-effects or Adverse reactions: None reported Historical Monitoring: The patient  reports that he does not use drugs. List of all UDS Test(s): No results found for: MDMA, COCAINSCRNUR, PCPSCRNUR, PCPQUANT, CANNABQUANT, THCU, [place] List of all Serum Drug Screening Test(s):  No results found for: AMPHSCRSER,  BARBSCRSER, BENZOSCRSER, COCAINSCRSER, PCPSCRSER, PCPQUANT, THCSCRSER, CANNABQUANT, OPIATESCRSER, OXYSCRSER, PROPOXSCRSER Historical Background Evaluation: [place] PDMP: Six (6) year initial data search conducted.             [place] Department of public safety, offender search: Editor, commissioning Information) Non-contributory Risk Assessment Profile: Aberrant behavior: None observed or detected today Risk factors for fatal opioid overdose: None identified today Fatal overdose hazard ratio (HR): Calculation deferred Non-fatal overdose hazard ratio (HR): Calculation deferred Risk of opioid abuse or dependence: 0.7-3.0% with doses ? 36 MME/day and 6.1-26% with doses ? 120 MME/day. Substance use disorder (SUD) risk level: Pending results of Medical Psychology Evaluation for SUD Opioid risk tool (ORT) (Total Score): 0  ORT Scoring interpretation table:  Score <3 = Low Risk for SUD  Score between 4-7 = Moderate Risk for SUD  Score >8 = High Risk for Opioid Abuse   PHQ-2 Depression Scale:  Total score:    PHQ-2 Scoring interpretation table: (Score and probability of major depressive disorder)  Score 0 = No depression  Score 1 = 15.4% Probability  Score 2 = 21.1% Probability  Score 3 = 38.4% Probability  Score 4 = 45.5% Probability  Score 5 = 56.4% Probability  Score 6 = 78.6% Probability   PHQ-9 Depression Scale:  Total score:    PHQ-9 Scoring interpretation table:  Score 0-4 = No depression  Score 5-9 = Mild depression  Score 10-14 = Moderate depression  Score 15-19 = Moderately severe depression  Score 20-27 = Severe depression (2.4 times higher risk of SUD and 2.89 times higher risk of overuse)   Pharmacologic Plan: Pending ordered tests and/or consults  Meds  The patient has a current medication list which includes the following prescription(s): aspirin ec, atorvastatin, cyclobenzaprine, furosemide, gabapentin, sildenafil, and vitamin d (ergocalciferol).  Current Outpatient Medications on File  Prior to Visit  Medication Sig  . aspirin EC 81 MG tablet Take 81 mg by mouth daily.  Marland Kitchen atorvastatin (LIPITOR) 20 MG tablet Take 1 tablet by mouth daily.  . cyclobenzaprine (FLEXERIL) 10 MG tablet TK 1 T PO TID  . furosemide (LASIX) 20 MG tablet TK 1 T PO QD PRN  . gabapentin (NEURONTIN) 600 MG tablet TK 1 T PO BID  . sildenafil (REVATIO) 20 MG tablet Take 5 tablets by mouth as needed.  . Vitamin D, Ergocalciferol, (DRISDOL) 50000 UNITS CAPS capsule every 7 (seven) days.    No current facility-administered medications on file prior to visit.    Imaging Review  Lumbosacral Imaging:  Lumbar DG (Complete) 4+V:  Results for orders placed in visit on 04/02/18  DG Lumbar Spine Complete   Narrative Clinical:  Long history of lower back pain  X-rays were done of the lumbar spine, five views.  There is diffuse mild degenerative changes of the lumbar spine.  No  fracture is noted.  Normal lordosis is present.  Bone quality is good.  Impression:  Mild diffuse degenerative changes of the lumbar spine, no  acute findings.  Electronically Signed Sanjuana Kava, MD 11/7/20198:41 AM   Note: Available results from prior imaging studies were reviewed.        ROS  Cardiovascular History: No reported cardiovascular signs or symptoms such as High blood pressure, coronary artery disease, abnormal heart rate or rhythm, heart attack, blood thinner therapy or heart weakness and/or failure Pulmonary or Respiratory History: Lung problems, Wheezing and difficulty taking a deep full breath (Asthma), Shortness of breath and Smoking Neurological History: No  reported neurological signs or symptoms such as seizures, abnormal skin sensations, urinary and/or fecal incontinence, being born with an abnormal open spine and/or a tethered spinal cord Review of Past Neurological Studies: No results found for this or any previous visit. Psychological-Psychiatric History: No reported psychological or psychiatric signs or  symptoms such as difficulty sleeping, anxiety, depression, delusions or hallucinations (schizophrenial), mood swings (bipolar disorders) or suicidal ideations or attempts Gastrointestinal History: No reported gastrointestinal signs or symptoms such as vomiting or evacuating blood, reflux, heartburn, alternating episodes of diarrhea and constipation, inflamed or scarred liver, or pancreas or irrregular and/or infrequent bowel movements Genitourinary History: No reported renal or genitourinary signs or symptoms such as difficulty voiding or producing urine, peeing blood, non-functioning kidney, kidney stones, difficulty emptying the bladder, difficulty controlling the flow of urine, or chronic kidney disease Hematological History: No reported hematological signs or symptoms such as prolonged bleeding, low or poor functioning platelets, bruising or bleeding easily, hereditary bleeding problems, low energy levels due to low hemoglobin or being anemic Endocrine History: No reported endocrine signs or symptoms such as high or low blood sugar, rapid heart rate due to high thyroid levels, obesity or weight gain due to slow thyroid or thyroid disease Rheumatologic History: No reported rheumatological signs and symptoms such as fatigue, joint pain, tenderness, swelling, redness, heat, stiffness, decreased range of motion, with or without associated rash Musculoskeletal History: Negative for myasthenia gravis, muscular dystrophy, multiple sclerosis or malignant hyperthermia Work History: (no answer here)   Allergies  Mr. Darley has No Known Allergies.  Laboratory Chemistry  Inflammation Markers No results found for: CRP, ESRSEDRATE (CRP: Acute Phase) (ESR: Chronic Phase) Renal Function Markers No results found for: BUN, CREATININE, GFRAA, GFRNONAA Hepatic Function Markers No results found for: AST, ALT, ALBUMIN, ALKPHOS, HCVAB Electrolytes No results found for: NA, K, CL, CALCIUM, MG Neuropathy Markers No  results found for: [id number] Bone Pathology Markers No results found for: Hendricks Milo, VD125OH2TOT, [id number], HE1740CX4, 25OHVITD1, 25OHVITD2, 25OHVITD3, CALCIUM, TESTOFREE, TESTOSTERONE Coagulation Parameters No results found for: INR, LABPROT, APTT, PLT Cardiovascular Markers No results found for: BNP, HGB, HCT Note: Lab results reviewed.  PFSH  Drug: Mr. Lovelady  reports that he does not use drugs. Alcohol:  reports that he does not drink alcohol. Tobacco:  reports that he has been smoking cigarettes. He has been smoking about 2.00 packs per day. He has never used smokeless tobacco. Medical:  has a past medical history of Anemia, Chronic back pain, Chronic pain syndrome, Chronic pain syndrome, Chronic sinusitis, Dyslipidemia, Epididymitis, Erectile dysfunction, Fatigue, Groin pain, High cholesterol, Hydrocele, Nicotine addiction, Shortness of breath dyspnea, Stroke ([place]) (2006), and Testicular pain. Family: family history includes Cancer in his father and mother.  Past Surgical History:  Procedure Laterality Date  . APPENDECTOMY     Active Ambulatory Problems    Diagnosis Date Noted  . Epididymitis 11/29/2014  . Microscopic hematuria 11/29/2014  . Orchitis and epididymitis 12/16/2014  . Drug-seeking behavior 01/12/2015  . Bilateral hydrocele 01/12/2015  . Bilateral varicoceles 01/12/2015  . Back pain at L4-L5 level 02/15/2015  . DDD (degenerative disc disease), lumbosacral 02/15/2015  . Lumbar radiculopathy 02/15/2015  . Rectal bleeding 03/17/2018  . Dysphagia 03/17/2018  . Anemia 04/20/2018  . Chronic midline low back pain without sciatica 05/31/2015  . Chronic pain syndrome 04/20/2018  . Dyslipidemia 04/20/2018  . Vitamin D deficiency 04/20/2018  . Chronic bilateral low back pain with left-sided sciatica (Primary Area of Pain)  04/20/2018  . Chronic pain of left  lower extremity (Secondary Area of Pain) 04/20/2018  . Pharmacologic therapy 04/20/2018  . Disorder of  skeletal system 04/20/2018  . Problems influencing health status 04/20/2018  . Long term current use of opiate analgesic 04/20/2018  . Chronic left sacroiliac joint pain 04/20/2018   Resolved Ambulatory Problems    Diagnosis Date Noted  . No Resolved Ambulatory Problems   Past Medical History:  Diagnosis Date  . Chronic back pain   . Chronic sinusitis   . Erectile dysfunction   . Fatigue   . Groin pain   . High cholesterol   . Hydrocele   . Nicotine addiction   . Shortness of breath dyspnea   . Stroke [facility]) 2006  . Testicular pain    Constitutional Exam  General appearance: alert, cooperative, in no distress and moderately obese Vitals:   04/20/18 0949  BP: 125/85  Pulse: 86  Temp: (!) 97.5 F (36.4 C)  SpO2: 100%  Weight: 260 lb (117.9 kg)  Height: 6' (1.829 m)   BMI Assessment: Estimated body mass index is 35.26 kg/m as calculated from the following:   Height as of this encounter: 6' (1.829 m).   Weight as of this encounter: 260 lb (117.9 kg).  BMI interpretation table: BMI level Category Range association with higher incidence of chronic pain  <18 kg/m2 Underweight   18.5-24.9 kg/m2 Ideal body weight   25-29.9 kg/m2 Overweight Increased incidence by 20%  30-34.9 kg/m2 Obese (Class I) Increased incidence by 68%  35-39.9 kg/m2 Severe obesity (Class II) Increased incidence by 136%  >40 kg/m2 Extreme obesity (Class III) Increased incidence by 254%   BMI Readings from Last 4 Encounters:  04/20/18 35.26 kg/m  04/02/18 35.53 kg/m  03/17/18 35.91 kg/m  05/06/17 33.91 kg/m   Wt Readings from Last 4 Encounters:  04/20/18 260 lb (117.9 kg)  04/02/18 262 lb (118.8 kg)  03/17/18 264 lb 12.8 oz (120.1 kg)  05/06/17 250 lb (113.4 kg)  Psych/Mental status: Alert, oriented x 3 (person, place, & time)       Eyes: PERLA Respiratory: No evidence of acute respiratory distress  Lumbar Spine Exam  Inspection: No masses, redness, or swelling Alignment:  Symmetrical Functional ROM: Unrestricted ROM      Stability: No instability detected Muscle strength & Tone: Functionally intact Sensory: Unimpaired Palpation: Complains of area being tender to palpation       Provocative Tests: Lumbar Hyperextension and rotation test: Unable to perform due to pain. Patrick's Maneuver: Unable to perform             due to pain  Gait & Posture Assessment  Ambulation: Unassisted Gait: Antalgic dragging left foot.  Posture: Antalgic   Lower Extremity Exam    Side: Right lower extremity  Side: Left lower extremity  Inspection: No masses, redness, swelling, or asymmetry. No contractures  Inspection: No masses, redness, swelling, or asymmetry. No contractures  Functional ROM: Unrestricted ROM          Functional ROM: Pain restricted ROM          Muscle strength & Tone: Functionally intact  Muscle strength & Tone: Deconditioned  Sensory: Unimpaired  Sensory: Unimpaired  Palpation: No palpable anomalies  Palpation: No palpable anomalies   Assessment  Primary Diagnosis & Pertinent Problem List: The primary encounter diagnosis was Chronic pain syndrome. Diagnoses of Chronic bilateral low back pain with left-sided sciatica (Primary Area of Pain) , Chronic pain of left lower extremity (Secondary Area of Pain), Pharmacologic therapy, Disorder of skeletal  system, Problems influencing health status, Long term current use of opiate analgesic, and Chronic left sacroiliac joint pain were also pertinent to this visit.  Visit Diagnosis: 1. Chronic pain syndrome   2. Chronic bilateral low back pain with left-sided sciatica (Primary Area of Pain)    3. Chronic pain of left lower extremity (Secondary Area of Pain)   4. Pharmacologic therapy   5. Disorder of skeletal system   6. Problems influencing health status   7. Long term current use of opiate analgesic   8. Chronic left sacroiliac joint pain    Plan of Care  Initial treatment plan:  Please be advised that as  per protocol, today's visit has been an evaluation only. We have not taken over the patient's controlled substance management.  Problem-specific plan: No problem-specific Assessment & Plan notes found for this encounter.  Ordered Lab-work, Procedure(s), Referral(s), & Consult(s): Orders Placed This Encounter  Procedures  . DG Si Joints  . Compliance Drug Analysis, Ur  . Comp. Metabolic Panel (12)  . Magnesium  . Vitamin B12  . Sedimentation rate  . C-reactive protein  . 25-Hydroxyvitamin D Lcms D2+D3  . Ambulatory referral to Physical Therapy   Pharmacotherapy: Medications ordered:  No orders of the defined types were placed in this encounter.  Medications administered during this visit: Dorien Chihuahua had no medications administered during this visit.   Pharmacotherapy under consideration:  Opioid Analgesics: The patient was informed that there is no guarantee that he would be a candidate for opioid analgesics. The decision will be made following CDC guidelines. This decision will be based on the results of diagnostic studies, as well as Mr. Shipes risk profile.  Membrane stabilizer: To be determined at a later time Muscle relaxant: To be determined at a later time NSAID: To be determined at a later time Other analgesic(s): To be determined at a later time   Interventional therapies under consideration: Mr. Freid was informed that there is no guarantee that he would be a candidate for interventional therapies. The decision will be based on the results of diagnostic studies, as well as Mr. Kading risk profile.  Possible procedure(s):  Diagnostic left-sided lumbar epidural steroid injection Diagnostic bilateral lumbar facet nerve block Possible bilateral lumbar facet radiofrequency ablation Diagnostic left sacroiliac joint injection Possible left sacroiliac joint radiofrequency ablation   Provider-requested follow-up: Return for 2nd Visit, w/ Dr. Dossie Arbour, medical record  release.  Future Appointments  Date Time Provider [place]  05/11/2018  8:30 AM Milinda Pointer, MD [facility] None  06/02/2018 11:00 AM [facility] PAT 1 [facility] None    Primary Care Physician: Martin Majestic, FNP Location: [facility] Outpatient [facility] Management Facility Note by:  Date: 04/20/2018; Time: 1:16 PM  Pain Score Disclaimer: We use the NRS-11 scale. This is a self-reported, subjective measurement of pain severity with only modest accuracy. It is used primarily to identify changes within a particular patient. It must be understood that outpatient pain scales are significantly less accurate that those used for research, where they can be applied under ideal controlled circumstances with minimal exposure to variables. In reality, the score is likely to be a combination of pain intensity and pain affect, where pain affect describes the degree of emotional arousal or changes in action readiness caused by the sensory experience of pain. Factors such as social and work situation, setting, emotional state, anxiety levels, expectation, and prior pain experience may influence pain perception and show large inter-individual differences that may also be affected by  time variables.  Patient instructions provided during this appointment: Patient Instructions   ____________________________________________________________________________________________  Appointment Policy Summary  It is our goal and responsibility to provide the medical community with assistance in the evaluation and management of patients with chronic pain. Unfortunately our resources are limited. Because we do not have an unlimited amount of time, or available appointments, we are required to closely monitor and manage their use. The following rules exist to maximize their use:  Patient's responsibilities: 1. Punctuality:  At what time should I arrive? You should be physically present in our office 30 minutes before  your scheduled appointment. Your scheduled appointment is with your assigned healthcare provider. However, it takes 5-10 minutes to be "checked-in", and another 15 minutes for the nurses to do the admission. If you arrive to our office at the time you were given for your appointment, you will end up being at least 20-25 minutes late to your appointment with the provider. 2. Tardiness:  What happens if I arrive only a few minutes after my scheduled appointment time? You will need to reschedule your appointment. The cutoff is your appointment time. This is why it is so important that you arrive at least 30 minutes before that appointment. If you have an appointment scheduled for 10:00 AM and you arrive at 10:01, you will be required to reschedule your appointment.  3. Plan ahead:  Always assume that you will encounter traffic on your way in. Plan for it. If you are dependent on a driver, make sure they understand these rules and the need to arrive early. 4. Other appointments and responsibilities:  Avoid scheduling any other appointments before or after your pain clinic appointments.  5. Be prepared:  Write down everything that you need to discuss with your healthcare provider and give this information to the admitting nurse. Write down the medications that you will need refilled. Bring your pills and bottles (even the empty ones), to all of your appointments, except for those where a procedure is scheduled. 6. No children or pets:  Find someone to take care of them. It is not appropriate to bring them in. 7. Scheduling changes:  We request "advanced notification" of any changes or cancellations. 8. Advanced notification:  Defined as a time period of more than 24 hours prior to the originally scheduled appointment. This allows for the appointment to be offered to other patients. 9. Rescheduling:  When a visit is rescheduled, it will require the cancellation of the original appointment. For this reason  they both fall within the category of "Cancellations".  10. Cancellations:  They require advanced notification. Any cancellation less than 24 hours before the  appointment will be recorded as a "No Show". 11. No Show:  Defined as an unkept appointment where the patient failed to notify or declare to the practice their intention or inability to keep the appointment.  Corrective process for repeat offenders:  1. Tardiness: Three (3) episodes of rescheduling due to late arrivals will be recorded as one (1) "No Show". 2. Cancellation or reschedule: Three (3) cancellations or rescheduling will be recorded as one (1) "No Show". 3. "No Shows": Three (3) "No Shows" within a 12 month period will result in discharge from the practice. ____________________________________________________________________________________________   ______________________________________________________________________________________________  Specialty Pain Scale  Introduction:  There are significant differences in how pain is reported. The word pain usually refers to physical pain, but it is also a common synonym of suffering. The medical community uses a scale from 0 (zero)  to 10 (ten) to report pain level. Zero (0) is described as "no pain", while ten (10) is described as "the worse pain you can imagine". The problem with this scale is that physical pain is reported along with suffering. Suffering refers to mental pain, or more often yet it refers to any unpleasant feeling, emotion or aversion associated with the perception of harm or threat of harm. It is the psychological component of pain.  Pain Specialists prefer to separate the two components. The pain scale used by this practice is the Verbal Numerical Rating Scale (VNRS-11). This scale is for the physical pain only. DO NOT INCLUDE how your pain psychologically affects you. This scale is for adults 7 years of age and older. It has 11 (eleven) levels. The 1st level is  0/10. This means: "right now, I have no pain". In the context of pain management, it also means: "right now, my physical pain is under control with the current therapy".  General Information:  The scale should reflect your current level of pain. Unless you are specifically asked for the level of your worst pain, or your average pain. If you are asked for one of these two, then it should be understood that it is over the past 24 hours.  Levels 1 (one) through 5 (five) are described below, and can be treated as an outpatient. Ambulatory pain management facilities such as ours are more than adequate to treat these levels. Levels 6 (six) through 10 (ten) are also described below, however, these must be treated as a hospitalized patient. While levels 6 (six) and 7 (seven) may be evaluated at an urgent care facility, levels 8 (eight) through 10 (ten) constitute medical emergencies and as such, they belong in a hospital's emergency department. When having these levels (as described below), do not come to our office. Our facility is not equipped to manage these levels. Go directly to an urgent care facility or an emergency department to be evaluated.  Definitions:  Activities of Daily Living (ADL): Activities of daily living (ADL or ADLs) is a term used in healthcare to refer to people's daily self-care activities. Health professionals often use a person's ability or inability to perform ADLs as a measurement of their functional status, particularly in regard to people post injury, with disabilities and the elderly. There are two ADL levels: Basic and Instrumental. Basic Activities of Daily Living (BADL  or BADLs) consist of self-care tasks that include: Bathing and showering; personal hygiene and grooming (including brushing/combing/styling hair); dressing; Toilet hygiene (getting to the toilet, cleaning oneself, and getting back up); eating and self-feeding (not including cooking or chewing and swallowing);  functional mobility, often referred to as "transferring", as measured by the ability to walk, get in and out of bed, and get into and out of a chair; the broader definition (moving from one place to another while performing activities) is useful for people with different physical abilities who are still able to get around independently. Basic ADLs include the things many people do when they get up in the morning and get ready to go out of the house: get out of bed, go to the toilet, bathe, dress, groom, and eat. On the average, loss of function typically follows a particular order. Hygiene is the first to go, followed by loss of toilet use and locomotion. The last to go is the ability to eat. When there is only one remaining area in which the person is independent, there is a 62.9% chance that  it is eating and only a 3.5% chance that it is hygiene. Instrumental Activities of Daily Living (IADL or IADLs) are not necessary for fundamental functioning, but they let an individual live independently in a community. IADL consist of tasks that include: cleaning and maintaining the house; home establishment and maintenance; care of others (including selecting and supervising caregivers); care of pets; child rearing; managing money; managing financials (investments, etc.); meal preparation and cleanup; shopping for groceries and necessities; moving within the community; safety procedures and emergency responses; health management and maintenance (taking prescribed medications); and using the telephone or other form of communication.  Instructions:  Most patients tend to report their pain as a combination of two factors, their physical pain and their psychosocial pain. This last one is also known as "suffering" and it is reflection of how physical pain affects you socially and psychologically. From now on, report them separately.  From this point on, when asked to report your pain level, report only your physical pain.  Use the following table for reference.  Pain Clinic Pain Levels (0-5/10)  Pain Level Score  Description  No Pain 0   Mild pain 1 Nagging, annoying, but does not interfere with basic activities of daily living (ADL). Patients are able to eat, bathe, get dressed, toileting (being able to get on and off the toilet and perform personal hygiene functions), transfer (move in and out of bed or a chair without assistance), and maintain continence (able to control bladder and bowel functions). Blood pressure and heart rate are unaffected. A normal heart rate for a healthy adult ranges from 60 to 100 bpm (beats per minute).   Mild to moderate pain 2 Noticeable and distracting. Impossible to hide from other people. More frequent flare-ups. Still possible to adapt and function close to normal. It can be very annoying and may have occasional stronger flare-ups. With discipline, patients may get used to it and adapt.   Moderate pain 3 Interferes significantly with activities of daily living (ADL). It becomes difficult to feed, bathe, get dressed, get on and off the toilet or to perform personal hygiene functions. Difficult to get in and out of bed or a chair without assistance. Very distracting. With effort, it can be ignored when deeply involved in activities.   Moderately severe pain 4 Impossible to ignore for more than a few minutes. With effort, patients may still be able to manage work or participate in some social activities. Very difficult to concentrate. Signs of autonomic nervous system discharge are evident: dilated pupils (mydriasis); mild sweating (diaphoresis); sleep interference. Heart rate becomes elevated (>115 bpm). Diastolic blood pressure (lower number) rises above 100 mmHg. Patients find relief in laying down and not moving.   Severe pain 5 Intense and extremely unpleasant. Associated with frowning face and frequent crying. Pain overwhelms the senses.  Ability to do any activity or maintain  social relationships becomes significantly limited. Conversation becomes difficult. Pacing back and forth is common, as getting into a comfortable position is nearly impossible. Pain wakes you up from deep sleep. Physical signs will be obvious: pupillary dilation; increased sweating; goosebumps; brisk reflexes; cold, clammy hands and feet; nausea, vomiting or dry heaves; loss of appetite; significant sleep disturbance with inability to fall asleep or to remain asleep. When persistent, significant weight loss is observed due to the complete loss of appetite and sleep deprivation.  Blood pressure and heart rate becomes significantly elevated. Caution: If elevated blood pressure triggers a pounding headache associated with blurred vision, then  the patient should immediately seek attention at an urgent or emergency care unit, as these may be signs of an impending stroke.    Emergency Department Pain Levels (6-10/10)  Emergency Room Pain 6 Severely limiting. Requires emergency care and should not be seen or managed at an outpatient pain management facility. Communication becomes difficult and requires great effort. Assistance to reach the emergency department may be required. Facial flushing and profuse sweating along with potentially dangerous increases in heart rate and blood pressure will be evident.   Distressing pain 7 Self-care is very difficult. Assistance is required to transport, or use restroom. Assistance to reach the emergency department will be required. Tasks requiring coordination, such as bathing and getting dressed become very difficult.   Disabling pain 8 Self-care is no longer possible. At this level, pain is disabling. The individual is unable to do even the most "basic" activities such as walking, eating, bathing, dressing, transferring to a bed, or toileting. Fine motor skills are lost. It is difficult to think clearly.   Incapacitating pain 9 Pain becomes incapacitating. Thought  processing is no longer possible. Difficult to remember your own name. Control of movement and coordination are lost.   The worst pain imaginable 10 At this level, most patients pass out from pain. When this level is reached, collapse of the autonomic nervous system occurs, leading to a sudden drop in blood pressure and heart rate. This in turn results in a temporary and dramatic drop in blood flow to the brain, leading to a loss of consciousness. Fainting is one of the body's self defense mechanisms. Passing out puts the brain in a calmed state and causes it to shut down for a while, in order to begin the healing process.    Summary: 1. Refer to this scale when providing [place] with your pain level. 2. Be accurate and careful when reporting your pain level. This will help with your care. 3. Over-reporting your pain level will lead to loss of credibility. 4. Even a level of 1/10 means that there is pain and will be treated at our facility. 5. High, inaccurate reporting will be documented as "Symptom Exaggeration", leading to loss of credibility and suspicions of possible secondary gains such as obtaining more narcotics, or wanting to appear disabled, for fraudulent reasons. 6. Only pain levels of 5 or below will be seen at our facility. 7. Pain levels of 6 and above will be sent to the Emergency Department and the appointment cancelled. ______________________________________________________________________________________________   BMI Assessment: Estimated body mass index is 35.26 kg/m as calculated from the following:   Height as of this encounter: 6' (1.829 m).   Weight as of this encounter: 260 lb (117.9 kg).  BMI interpretation table: BMI level Category Range association with higher incidence of chronic pain  <18 kg/m2 Underweight   18.5-24.9 kg/m2 Ideal body weight   25-29.9 kg/m2 Overweight Increased incidence by 20%  30-34.9 kg/m2 Obese (Class I) Increased incidence by 68%  35-39.9  kg/m2 Severe obesity (Class II) Increased incidence by 136%  >40 kg/m2 Extreme obesity (Class III) Increased incidence by 254%   BMI Readings from Last 4 Encounters:  04/20/18 35.26 kg/m  04/02/18 35.53 kg/m  03/17/18 35.91 kg/m  05/06/17 33.91 kg/m   Wt Readings from Last 4 Encounters:  04/20/18 260 lb (117.9 kg)  04/02/18 262 lb (118.8 kg)  03/17/18 264 lb 12.8 oz (120.1 kg)  05/06/17 250 lb (113.4 kg)

## 2018-04-24 LAB — SEDIMENTATION RATE: Sed Rate: 15 mm/hr (ref 0–15)

## 2018-04-24 LAB — COMP. METABOLIC PANEL (12)
AST: 11 IU/L (ref 0–40)
Albumin/Globulin Ratio: 1.8 (ref 1.2–2.2)
Albumin: 4.4 g/dL (ref 3.5–5.5)
Alkaline Phosphatase: 109 IU/L (ref 39–117)
BUN/Creatinine Ratio: 11 (ref 9–20)
BUN: 11 mg/dL (ref 6–24)
Bilirubin Total: 0.2 mg/dL (ref 0.0–1.2)
Calcium: 9.8 mg/dL (ref 8.7–10.2)
Chloride: 103 mmol/L (ref 96–106)
Creatinine, Ser: 0.96 mg/dL (ref 0.76–1.27)
GFR calc Af Amer: 109 mL/min/{1.73_m2} (ref >59)
GFR calc non Af Amer: 94 mL/min/{1.73_m2} (ref >59)
Globulin, Total: 2.5 g/dL (ref 1.5–4.5)
Glucose: 113 mg/dL — ABNORMAL HIGH (ref 65–99)
Potassium: 5.1 mmol/L (ref 3.5–5.2)
Sodium: 139 mmol/L (ref 134–144)
Total Protein: 6.9 g/dL (ref 6.0–8.5)

## 2018-04-24 LAB — VITAMIN B12: Vitamin B-12: 289 pg/mL (ref 232–1245)

## 2018-04-24 LAB — C-REACTIVE PROTEIN: CRP: 5 mg/L (ref 0–10)

## 2018-04-24 LAB — MAGNESIUM: Magnesium: 1.9 mg/dL (ref 1.6–2.3)

## 2018-04-24 LAB — 25-HYDROXY VITAMIN D LCMS D2+D3
25-Hydroxy, Vitamin D-2: <1 ng/mL
25-Hydroxy, Vitamin D-3: 38 ng/mL
25-Hydroxy, Vitamin D: 39 ng/mL

## 2018-04-26 LAB — COMPLIANCE DRUG ANALYSIS, UR

## 2018-04-29 ENCOUNTER — Ambulatory Visit
Admission: RE | Admit: 2018-04-29 | Discharge: 2018-04-29 | Disposition: A | Discharge status: Home / Self Care | Payer: Medicare Other | Source: Ambulatory Visit | Attending: Nurse Practitioner | Admitting: Nurse Practitioner

## 2018-04-29 ENCOUNTER — Telehealth: Payer: Self-pay | Admitting: Gastroenterology

## 2018-04-29 DIAGNOSIS — G8929 Other chronic pain: Secondary | ICD-10-CM | POA: Insufficient documentation

## 2018-04-29 DIAGNOSIS — M533 Sacrococcygeal disorders, not elsewhere classified: Secondary | ICD-10-CM | POA: Diagnosis not present

## 2018-04-29 NOTE — Telephone Encounter (Signed)
CANCEL ALL FOLLOW UPS. PT ESTABLISHED CARE WITH [facility].

## 2018-05-10 NOTE — Progress Notes (Signed)
Patient's Name: Manuel Richard  MRN: [id number]  Referring Provider: Martin Majestic, *  DOB: December 08, 1970  PCP: Martin Majestic, FNP  DOS: 05/11/2018  Note by: Gaspar Cola, MD  Service setting: Ambulatory outpatient  Specialty: Interventional Pain Management  Location: [facility] (AMB) Pain Management Facility    Patient type: Established   Primary Reason(s) for Visit: Encounter for evaluation before starting new chronic pain management plan of care (Level of risk: moderate) CC: Back Pain (low) and Leg Pain (left)  HPI  Manuel Richard is a 47 y.o. year old, male patient, who comes today for a follow-up evaluation to review the test results and decide on a treatment plan. He has Epididymitis; Microscopic hematuria; Orchitis and epididymitis; Drug-seeking behavior; Bilateral hydrocele; Bilateral varicoceles; Back pain at L4-L5 level; DDD (degenerative disc disease), lumbosacral; Lumbar radiculopathy; Rectal bleeding; Dysphagia; Anemia; Chronic low back pain (Midline) w/o sciatica; Chronic pain syndrome; Dyslipidemia; Vitamin D deficiency; Chronic low back pain (Primary Area of Pain) (Bilateral) (L>R) w/ sciatica (Left); Chronic lower extremity pain (Secondary Area of Pain) (Left); Pharmacologic therapy; Disorder of skeletal system; Problems influencing health status; Long term current use of opiate analgesic; and Chronic sacroiliac joint pain (Left) on their problem list. His primarily concern today is the Back Pain (low) and Leg Pain (left)  Pain Assessment: Location: Lower Back Radiating: left leg Onset: More than a month ago Duration: Chronic pain Quality: Constant, Stabbing, Sharp Severity: 10-Worst pain ever/10 (subjective, self-reported pain score)  Note: Reported level is inconsistent with clinical observations. Clinically the patient looks like a 3/10 A 3/10 is viewed as "Moderate" and described as significantly interfering with activities of daily living (ADL). It becomes difficult  to feed, bathe, get dressed, get on and off the toilet or to perform personal hygiene functions. Difficult to get in and out of bed or a chair without assistance. Very distracting. With effort, it can be ignored when deeply involved in activities. Information on the proper use of the pain scale provided to the patient today. When using our objective Pain Scale, levels between 6 and 10/10 are said to belong in an emergency room, as it progressively worsens from a 6/10, described as severely limiting, requiring emergency care not usually available at an outpatient pain management facility. At a 6/10 level, communication becomes difficult and requires great effort. Assistance to reach the emergency department may be required. Facial flushing and profuse sweating along with potentially dangerous increases in heart rate and blood pressure will be evident. Timing: Constant Modifying factors: "nothing" BP: 114/84  HR: 30  Manuel Richard comes in today for a follow-up visit after his initial evaluation on 04/20/2018. Today we went over the results of his tests. These were explained in "Layman's terms". During today's appointment we went over my diagnostic impression, as well as the proposed treatment plan.  According to the patient's primary area of pain is in his lower back (B) (L>R).  He admits that this pain has been going on approximately 15 years from a work-related accident.  He denies any previous surgery.  He did have interventional therapy once while in [place] but states that it was not effective.  He denies any recent physical therapy. Labs completed 2005.  He has had recent x-rays and has an MRI pending.  His second area pain is in his left leg.  He admits that it goes down the whole leg to the bottom of his foot (S1).  He denies any problems on the right.  He does have numbness tingling and weakness.  He admits that he did have a nerve conduction study completed at [facility] (>10 years ago).  In  considering the treatment plan options, Manuel Richard was reminded that I no longer take patients for medication management only. I asked him to let me know if he had no intention of taking advantage of the interventional therapies, so that we could make arrangements to provide this space to someone interested. I also made it clear that undergoing interventional therapies for the purpose of getting pain medications is very inappropriate on the part of a patient, and it will not be tolerated in this practice. This type of behavior would suggest true addiction and therefore it requires referral to an addiction specialist.   Today, the patient indicated that he is not interested in any type of interventional therapy as he did try some injections while he lived in [place] and they were very painful.  He indicates being scare of needles.  According to him interventional therapies are not an option.  Because the patient is not interested in what I specialize in, I concentrated in going over the results of his lab work and x-rays.  I have explained to him that the available imaging does not seem to show any significant pathology.  He was scheduled to have an MRI, but he indicated that he was never called to schedule an appointment.  While speaking to him, it seems to me that he also was not really interested in having the MRI as he indicated that he has already had multiple MRIs in the past and all other studies that are in [place].  Unfortunately, none of them are available to me here and therefore I have no information regarding any significant pathology at this time.  I asked the patient if the studies were recent (less than 2 years) and he indicated that they were not.  I offered to order an MRI and perhaps a CT scan to see if we can identify any type of pathology that we may be able to help with the interventional therapies that I specialize in.  At this point, he indicated that he was not interested in this and  that if he had known that all we did was "injections", he would not have come in today.  Further details on both, my assessment(s), as well as the proposed treatment plan, please see below.  Controlled Substance Pharmacotherapy Assessment REMS (Risk Evaluation and Mitigation Strategy)  Analgesic: None Highest recorded MME/day: 112.5 mg/day MME/day: 0 mg/day Pill Count: None expected due to no prior prescriptions written by our practice. Hart Rochester, RN  05/11/2018  8:12 AM  Sign when Signing Visit Safety precautions to be maintained throughout the outpatient stay will include: orient to surroundings, keep bed in low position, maintain call bell within reach at all times, provide assistance with transfer out of bed and ambulation.    Pharmacokinetics: N/A Pharmacodynamics: N/A Monitoring: [place] PMP: I looked at the patient's PMP going back to 2013.  The last time that he obtained any controlled substances was on 05/07/2017 when he had hydrocodone/APAP 5/325 #25.  This was written to be used over a period of 6 days N/A List of other Serum/Urine Drug Screening Test(s):  No results found. List of all UDS test(s) done:  Lab Results  Component Value Date   SUMMARY FINAL 04/20/2018   Last UDS on record: Summary  Date Value Ref Range Status  04/20/2018 FINAL  Final    Comment:    ==================================================================== TOXASSURE COMP DRUG ANALYSIS,UR ==================================================================== Test                             Result       Flag       Units Drug Present and Declared for Prescription Verification   Gabapentin                     PRESENT      EXPECTED Drug Absent but Declared for Prescription Verification   Cyclobenzaprine                Not Detected UNEXPECTED   Salicylate                     Not Detected UNEXPECTED    Aspirin, as indicated in the declared medication list, is not    always detected even when  used as directed. ==================================================================== Test                      Result    Flag   Units      Ref Range   Creatinine              35               mg/dL      >=20 ==================================================================== Declared Medications:  The flagging and interpretation on this report are based on the  following declared medications.  Unexpected results may arise from  inaccuracies in the declared medications.  **Note: The testing scope of this panel includes these medications:  Cyclobenzaprine (Flexeril)  Gabapentin  **Note: The testing scope of this panel does not include small to  moderate amounts of these reported medications:  Aspirin (Aspirin 81)  **Note: The testing scope of this panel does not include following  reported medications:  Atorvastatin (Lipitor)  Furosemide (Lasix)  Sildenafil (Revatio)  Vitamin D2 (Drisdol) ==================================================================== For clinical consultation, please call [phone]. ====================================================================    UDS interpretation: Unexpected findings not considered significantly abnormal.          Medication Assessment Form: Not applicable. No opioids. Treatment compliance: Not applicable Risk Assessment Profile: Aberrant behavior: claims that "nothing else works" and see prior evaluations. None observed or detected today. Comorbid factors increasing risk of overdose: age 99-47 years old, caucasian and male gender Opioid risk tool (ORT) (Total Score): 0 Personal History of Substance Abuse (SUD-Substance use disorder):  Alcohol: Negative  Illegal Drugs: Negative  Rx Drugs: Negative  ORT Risk Level calculation: Low Risk Risk of substance use disorder (SUD): Low Opioid Risk Tool - 05/11/18 0813      Family History of Substance Abuse   Alcohol  Negative    Illegal Drugs  Negative    Rx Drugs  Negative       Personal History of Substance Abuse   Alcohol  Negative    Illegal Drugs  Negative    Rx Drugs  Negative      Age   Age between 48-45 years   No      History of Preadolescent Sexual Abuse   History of Preadolescent Sexual Abuse  Negative or Male      Psychological Disease   Psychological Disease  Negative    Depression  Negative      Total Score   Opioid Risk Tool Scoring  0    Opioid  Risk Interpretation  Low Risk      ORT Scoring interpretation table:  Score <3 = Low Risk for SUD  Score between 4-7 = Moderate Risk for SUD  Score >8 = High Risk for Opioid Abuse   Risk Mitigation Strategies:  Patient opioid safety counseling: No controlled substances prescribed. Patient-Prescriber Agreement (PPA): No agreement signed.  Controlled substance notification to other providers: None required. No opioid therapy.  Pharmacologic Plan: No opioid analgesic prescribed. He is not interested interventional therapies, only on medications. Unfortunately, we do not have the necessary resources to take on his case for medication management only.  Laboratory Chemistry  Inflammation Markers (CRP: Acute Phase) (ESR: Chronic Phase) Lab Results  Component Value Date   CRP 5 04/20/2018   ESRSEDRATE 15 04/20/2018                         Rheumatology Markers No results found.  Renal Function Markers Lab Results  Component Value Date   BUN 11 04/20/2018   CREATININE 0.96 04/20/2018   BCR 11 04/20/2018   GFRAA 109 04/20/2018   GFRNONAA 94 04/20/2018                             Hepatic Function Markers Lab Results  Component Value Date   AST 11 04/20/2018   ALBUMIN 4.4 04/20/2018   ALKPHOS 109 04/20/2018                        Electrolytes Lab Results  Component Value Date   NA 139 04/20/2018   K 5.1 04/20/2018   CL 103 04/20/2018   CALCIUM 9.8 04/20/2018   MG 1.9 04/20/2018                        Neuropathy Markers Lab Results  Component Value Date   VITAMINB12 289  04/20/2018                        CNS Tests No results found.  Bone Pathology Markers Lab Results  Component Value Date   25OHVITD1 39 04/20/2018   25OHVITD2 <1.0 04/20/2018   25OHVITD3 38 04/20/2018                         Coagulation Parameters No results found.  Cardiovascular Markers No results found.  CA Markers No results found.  Note: Lab results reviewed.  Recent Diagnostic Imaging Review  Lumbosacral Imaging: Lumbar DG (Complete) 4+V:  Results for orders placed in visit on 04/02/18  DG Lumbar Spine Complete   Narrative Clinical:  Long history of lower back pain  X-rays were done of the lumbar spine, five views.  There is diffuse mild degenerative changes of the lumbar spine.  No  fracture is noted.  Normal lordosis is present.  Bone quality is good.  Impression:  Mild diffuse degenerative changes of the lumbar spine, no  acute findings.  Electronically Signed Sanjuana Kava, MD 11/7/20198:41 AM    Sacroiliac Joint Imaging: Sacroiliac Joint DG:  Results for orders placed during the hospital encounter of 04/29/18  DG Si Joints   Narrative CLINICAL DATA:  47 year old male with SI joint pain.  EXAM: BILATERAL SACROILIAC JOINTS - 3+ VIEW  COMPARISON:  Lumbar MRI 12/04/2010.  Lumbar radiographs 11/12/2010.  FINDINGS: Bone mineralization is within normal limits. The  sacral ala and SI joints appear stable since 2012 and normal. No osseous abnormality identified. Negative visible pelvic visceral contours.  IMPRESSION: Negative.   Electronically Signed   By: Genevie Ann M.D.   On: 04/29/2018 16:46    Complexity Note: Imaging results reviewed. Results shared with Manuel Richard, using Layman's terms.                         Meds   Current Outpatient Medications:  .  aspirin EC 81 MG tablet, Take 81 mg by mouth daily., Disp: , Rfl:  .  atorvastatin (LIPITOR) 20 MG tablet, Take 1 tablet by mouth daily., Disp: , Rfl: 5 .  furosemide (LASIX) 20 MG tablet,  TK 1 T PO QD PRN, Disp: , Rfl:  .  gabapentin (NEURONTIN) 600 MG tablet, TK 1 T PO BID, Disp: , Rfl:  .  sildenafil (REVATIO) 20 MG tablet, Take 5 tablets by mouth as needed., Disp: , Rfl: 11 .  Vitamin D, Ergocalciferol, (DRISDOL) 50000 UNITS CAPS capsule, every 7 (seven) days. , Disp: , Rfl: 0  ROS  Constitutional: Denies any fever or chills Gastrointestinal: No reported hemesis, hematochezia, vomiting, or acute GI distress Musculoskeletal: Denies any acute onset joint swelling, redness, loss of ROM, or weakness Neurological: No reported episodes of acute onset apraxia, aphasia, dysarthria, agnosia, amnesia, paralysis, loss of coordination, or loss of consciousness  Allergies  Manuel Richard has No Known Allergies.  PFSH  Drug: Manuel Richard  reports no history of drug use. Alcohol:  reports no history of alcohol use. Tobacco:  reports that he has been smoking cigarettes. He has been smoking about 2.00 packs per day. He has never used smokeless tobacco. Medical:  has a past medical history of Anemia, Chronic back pain, Chronic pain syndrome, Chronic pain syndrome, Chronic sinusitis, Dyslipidemia, Epididymitis, Erectile dysfunction, Fatigue, Groin pain, High cholesterol, Hydrocele, Nicotine addiction, Shortness of breath dyspnea, Stroke ([place]) (2006), and Testicular pain. Surgical: Manuel Richard  has a past surgical history that includes Appendectomy. Family: family history includes Cancer in his father and mother.  Constitutional Exam  General appearance: Well nourished, well developed, and well hydrated. In no apparent acute distress.  The patient demonstrates evidence of poor self-care and hygene.  Vitals:   05/11/18 0807  BP: 114/84  Pulse: 81  Resp: 18  Temp: 98 F (36.7 C)  TempSrc: Oral  SpO2: 100%  Weight: 260 lb (117.9 kg)  Height: 6' (1.829 m)   BMI Assessment: Estimated body mass index is 35.26 kg/m as calculated from the following:   Height as of this encounter: 6' (1.829 m).    Weight as of this encounter: 260 lb (117.9 kg).  BMI interpretation table: BMI level Category Range association with higher incidence of chronic pain  <18 kg/m2 Underweight   18.5-24.9 kg/m2 Ideal body weight   25-29.9 kg/m2 Overweight Increased incidence by 20%  30-34.9 kg/m2 Obese (Class I) Increased incidence by 68%  35-39.9 kg/m2 Severe obesity (Class II) Increased incidence by 136%  >40 kg/m2 Extreme obesity (Class III) Increased incidence by 254%   Patient's current BMI Ideal Body weight  Body mass index is 35.26 kg/m. Ideal body weight: 77.6 kg (171 lb 1.2 oz) Adjusted ideal body weight: 93.7 kg (206 lb 10.3 oz)   BMI Readings from Last 4 Encounters:  05/11/18 35.26 kg/m  04/20/18 35.26 kg/m  04/02/18 35.53 kg/m  03/17/18 35.91 kg/m   Wt Readings from Last 4 Encounters:  05/11/18  260 lb (117.9 kg)  04/20/18 260 lb (117.9 kg)  04/02/18 262 lb (118.8 kg)  03/17/18 264 lb 12.8 oz (120.1 kg)  Psych/Mental status: Alert, oriented x 3 (person, place, & time)       Eyes: PERLA Respiratory: No evidence of acute respiratory distress  Cervical Spine Area Exam  Skin & Axial Inspection: No masses, redness, edema, swelling, or associated skin lesions Alignment: Symmetrical Functional ROM: Unrestricted ROM      Stability: No instability detected Muscle Tone/Strength: Functionally intact. No obvious neuro-muscular anomalies detected. Sensory (Neurological): Unimpaired Palpation: No palpable anomalies              Upper Extremity (UE) Exam    Side: Right upper extremity  Side: Left upper extremity  Skin & Extremity Inspection: Skin color, temperature, and hair growth are WNL. No peripheral edema or cyanosis. No masses, redness, swelling, asymmetry, or associated skin lesions. No contractures.  Skin & Extremity Inspection: Skin color, temperature, and hair growth are WNL. No peripheral edema or cyanosis. No masses, redness, swelling, asymmetry, or associated skin lesions. No  contractures.  Functional ROM: Unrestricted ROM          Functional ROM: Unrestricted ROM          Muscle Tone/Strength: Functionally intact. No obvious neuro-muscular anomalies detected.  Muscle Tone/Strength: Functionally intact. No obvious neuro-muscular anomalies detected.  Sensory (Neurological): Unimpaired          Sensory (Neurological): Unimpaired          Palpation: No palpable anomalies              Palpation: No palpable anomalies              Provocative Test(s):  Phalen's test: deferred Tinel's test: deferred Apley's scratch test (touch opposite shoulder):  Action 1 (Across chest): deferred Action 2 (Overhead): deferred Action 3 (LB reach): deferred   Provocative Test(s):  Phalen's test: deferred Tinel's test: deferred Apley's scratch test (touch opposite shoulder):  Action 1 (Across chest): deferred Action 2 (Overhead): deferred Action 3 (LB reach): deferred    Thoracic Spine Area Exam  Skin & Axial Inspection: No masses, redness, or swelling Alignment: Symmetrical Functional ROM: Unrestricted ROM Stability: No instability detected Muscle Tone/Strength: Functionally intact. No obvious neuro-muscular anomalies detected. Sensory (Neurological): Unimpaired Muscle strength & Tone: No palpable anomalies  Lumbar Spine Area Exam  Skin & Axial Inspection: No masses, redness, or swelling Alignment: Symmetrical Functional ROM: Unrestricted ROM       Stability: No instability detected Muscle Tone/Strength: Functionally intact. No obvious neuro-muscular anomalies detected. Sensory (Neurological): Unimpaired Palpation: No palpable anomalies       Provocative Tests: Hyperextension/rotation test: deferred today       Lumbar quadrant test (Kemp's test): deferred today       Lateral bending test: deferred today       Patrick's Maneuver: deferred today                   FABER* test: deferred today                   S-I anterior distraction/compression test: deferred today          S-I lateral compression test: deferred today         S-I Thigh-thrust test: deferred today         S-I Gaenslen's test: deferred today         *(Flexion, ABduction and External Rotation)  Gait & Posture Assessment  Ambulation: Patient ambulates using a cane Gait: Limited. Using assistive device to ambulate Posture: WNL   Lower Extremity Exam    Side: Right lower extremity  Side: Left lower extremity  Stability: No instability observed          Stability: No instability observed          Skin & Extremity Inspection: Skin color, temperature, and hair growth are WNL. No peripheral edema or cyanosis. No masses, redness, swelling, asymmetry, or associated skin lesions. No contractures.  Skin & Extremity Inspection: Skin color, temperature, and hair growth are WNL. No peripheral edema or cyanosis. No masses, redness, swelling, asymmetry, or associated skin lesions. No contractures.  Functional ROM: Unrestricted ROM                  Functional ROM: Unrestricted ROM                  Muscle Tone/Strength: Functionally intact. No obvious neuro-muscular anomalies detected.  Muscle Tone/Strength: Functionally intact. No obvious neuro-muscular anomalies detected.  Sensory (Neurological): Unimpaired        Sensory (Neurological): Unimpaired        DTR: Patellar: deferred today Achilles: deferred today Plantar: deferred today  DTR: Patellar: deferred today Achilles: deferred today Plantar: deferred today  Palpation: No palpable anomalies  Palpation: No palpable anomalies   Assessment & Plan  Primary Diagnosis & Pertinent Problem List: The primary encounter diagnosis was Chronic pain syndrome. Diagnoses of Chronic low back pain (Primary Area of Pain) (Bilateral) (L>R) w/ sciatica (Left) and Chronic lower extremity pain (Secondary Area of Pain) (Left) were also pertinent to this visit.  Visit Diagnosis: 1. Chronic pain syndrome   2. Chronic low back pain (Primary Area of Pain) (Bilateral) (L>R)  w/ sciatica (Left)   3. Chronic lower extremity pain (Secondary Area of Pain) (Left)    Problems updated and reviewed during this visit: Problem  Chronic Pain Syndrome  Chronic low back pain (Primary Area of Pain) (Bilateral) (L>R) w/ sciatica (Left)  Chronic lower extremity pain (Secondary Area of Pain) (Left)  Chronic sacroiliac joint pain (Left)  Chronic low back pain (Midline) w/o sciatica  Back Pain At L4-L5 Level  Ddd (Degenerative Disc Disease), Lumbosacral  Lumbar Radiculopathy  Orchitis and Epididymitis  Epididymitis  Vitamin D Deficiency  Pharmacologic Therapy  Disorder of Skeletal System  Problems Influencing Health Status  Long Term Current Use of Opiate Analgesic  Drug-Seeking Behavior  Bilateral Hydrocele  Anemia  Dyslipidemia  Rectal Bleeding  Dysphagia  Bilateral Varicoceles  Microscopic Hematuria   Plan of Care  Pharmacotherapy (Medications Ordered): No orders of the defined types were placed in this encounter.  Procedure Orders    No procedure(s) ordered today   Lab Orders  No laboratory test(s) ordered today   Imaging Orders  No imaging studies ordered today   Referral Orders  No referral(s) requested today    Pharmacological management options:  Opioid Analgesics: Not indicated at this time Membrane stabilizer: Currently on a membrane stabilizer Muscle relaxant: I will not be prescribing any at this time NSAID: I will not be prescribing any at this time Other analgesic(s): None prescribed at this time   Interventional management options: Planned, scheduled, and/or pending:    Refers being scared of needles. Does not want procedures.   Considering:   Diagnostic left-sided LESI  Diagnostic bilateral lumbar facet nerve block  Possible bilateral lumbar facet RFA  Diagnostic left sacroiliac joint injection  Possible left sacroiliac joint RFA  PRN Procedures:   None at this time   Provider-requested follow-up: No follow-ups on  file.  Future Appointments  Date Time Provider [place]  06/02/2018 11:00 AM [facility] PAT 1 [facility] None  06/03/2018  8:45 AM Joneen Boers R, PT [facility] None  06/09/2018  9:00 AM Madaline Savage, PT [facility] None  06/16/2018  9:00 AM Madaline Savage, PT [facility] None  06/23/2018  9:45 AM Madaline Savage, PT [facility] None  07/06/2018  9:00 AM Penumalli, Earlean Polka, MD [facility] None    Primary Care Physician: Martin Majestic, FNP Location: [facility] Note by: Gaspar Cola, MD Date: 05/11/2018; Time: 9:16 AM

## 2018-05-11 ENCOUNTER — Other Ambulatory Visit: Payer: Self-pay

## 2018-05-11 ENCOUNTER — Ambulatory Visit: Payer: Medicare Other | Attending: Pain Medicine | Admitting: Pain Medicine

## 2018-05-11 ENCOUNTER — Encounter: Payer: Self-pay | Admitting: Pain Medicine

## 2018-05-11 VITALS — BP 114/84 | HR 81 | Temp 98.0°F | Resp 18 | Ht 72.0 in | Wt 260.0 lb

## 2018-05-11 DIAGNOSIS — M5442 Lumbago with sciatica, left side: Secondary | ICD-10-CM | POA: Insufficient documentation

## 2018-05-11 DIAGNOSIS — M4727 Other spondylosis with radiculopathy, lumbosacral region: Secondary | ICD-10-CM | POA: Insufficient documentation

## 2018-05-11 DIAGNOSIS — N433 Hydrocele, unspecified: Secondary | ICD-10-CM | POA: Insufficient documentation

## 2018-05-11 DIAGNOSIS — J329 Chronic sinusitis, unspecified: Secondary | ICD-10-CM | POA: Insufficient documentation

## 2018-05-11 DIAGNOSIS — G894 Chronic pain syndrome: Secondary | ICD-10-CM | POA: Insufficient documentation

## 2018-05-11 DIAGNOSIS — M4726 Other spondylosis with radiculopathy, lumbar region: Secondary | ICD-10-CM | POA: Insufficient documentation

## 2018-05-11 DIAGNOSIS — Z8673 Personal history of transient ischemic attack (TIA), and cerebral infarction without residual deficits: Secondary | ICD-10-CM | POA: Diagnosis not present

## 2018-05-11 DIAGNOSIS — Z79891 Long term (current) use of opiate analgesic: Secondary | ICD-10-CM | POA: Insufficient documentation

## 2018-05-11 DIAGNOSIS — E7800 Pure hypercholesterolemia, unspecified: Secondary | ICD-10-CM | POA: Diagnosis not present

## 2018-05-11 DIAGNOSIS — I861 Scrotal varices: Secondary | ICD-10-CM | POA: Diagnosis not present

## 2018-05-11 DIAGNOSIS — Z79899 Other long term (current) drug therapy: Secondary | ICD-10-CM | POA: Diagnosis not present

## 2018-05-11 DIAGNOSIS — E559 Vitamin D deficiency, unspecified: Secondary | ICD-10-CM | POA: Insufficient documentation

## 2018-05-11 DIAGNOSIS — M533 Sacrococcygeal disorders, not elsewhere classified: Secondary | ICD-10-CM | POA: Diagnosis not present

## 2018-05-11 DIAGNOSIS — E785 Hyperlipidemia, unspecified: Secondary | ICD-10-CM | POA: Insufficient documentation

## 2018-05-11 DIAGNOSIS — K625 Hemorrhage of anus and rectum: Secondary | ICD-10-CM | POA: Diagnosis not present

## 2018-05-11 DIAGNOSIS — G8929 Other chronic pain: Secondary | ICD-10-CM | POA: Diagnosis present

## 2018-05-11 DIAGNOSIS — D649 Anemia, unspecified: Secondary | ICD-10-CM | POA: Insufficient documentation

## 2018-05-11 DIAGNOSIS — Z7982 Long term (current) use of aspirin: Secondary | ICD-10-CM | POA: Insufficient documentation

## 2018-05-11 DIAGNOSIS — R3129 Other microscopic hematuria: Secondary | ICD-10-CM | POA: Diagnosis not present

## 2018-05-11 DIAGNOSIS — N453 Epididymo-orchitis: Secondary | ICD-10-CM | POA: Diagnosis not present

## 2018-05-11 DIAGNOSIS — Z765 Malingerer [conscious simulation]: Secondary | ICD-10-CM | POA: Diagnosis not present

## 2018-05-11 DIAGNOSIS — M79605 Pain in left leg: Secondary | ICD-10-CM | POA: Insufficient documentation

## 2018-05-11 DIAGNOSIS — F17210 Nicotine dependence, cigarettes, uncomplicated: Secondary | ICD-10-CM | POA: Insufficient documentation

## 2018-05-11 DIAGNOSIS — R1310 Dysphagia, unspecified: Secondary | ICD-10-CM | POA: Diagnosis not present

## 2018-05-11 NOTE — Progress Notes (Signed)
Safety precautions to be maintained throughout the outpatient stay will include: orient to surroundings, keep bed in low position, maintain call bell within reach at all times, provide assistance with transfer out of bed and ambulation.  

## 2018-05-11 NOTE — Patient Instructions (Signed)

## 2018-05-26 NOTE — Telephone Encounter (Signed)
[facility]. Noted in chart [place] now [place]. [facility].

## 2018-06-01 ENCOUNTER — Ambulatory Visit: Payer: Medicare Other | Admitting: Podiatry

## 2018-06-02 ENCOUNTER — Inpatient Hospital Stay (HOSPITAL_COMMUNITY): Admission: RE | Admit: 2018-06-02 | Payer: Medicare Other | Source: Ambulatory Visit

## 2018-06-03 ENCOUNTER — Ambulatory Visit: Payer: Medicare Other | Attending: Nurse Practitioner

## 2018-06-06 ENCOUNTER — Emergency Department (HOSPITAL_COMMUNITY)
Admission: EM | Admit: 2018-06-06 | Discharge: 2018-06-06 | Disposition: A | Discharge status: Home / Self Care | Payer: Medicare Other | Attending: Emergency Medicine | Admitting: Emergency Medicine

## 2018-06-06 ENCOUNTER — Encounter (HOSPITAL_COMMUNITY): Payer: Self-pay | Admitting: Emergency Medicine

## 2018-06-06 ENCOUNTER — Other Ambulatory Visit: Payer: Self-pay

## 2018-06-06 ENCOUNTER — Emergency Department (HOSPITAL_COMMUNITY): Payer: Medicare Other

## 2018-06-06 DIAGNOSIS — Z79899 Other long term (current) drug therapy: Secondary | ICD-10-CM | POA: Diagnosis not present

## 2018-06-06 DIAGNOSIS — F17210 Nicotine dependence, cigarettes, uncomplicated: Secondary | ICD-10-CM | POA: Insufficient documentation

## 2018-06-06 DIAGNOSIS — Z7982 Long term (current) use of aspirin: Secondary | ICD-10-CM | POA: Diagnosis not present

## 2018-06-06 DIAGNOSIS — K5900 Constipation, unspecified: Secondary | ICD-10-CM | POA: Diagnosis present

## 2018-06-06 DIAGNOSIS — R1084 Generalized abdominal pain: Secondary | ICD-10-CM | POA: Insufficient documentation

## 2018-06-06 HISTORY — DX: Dysphagia, unspecified: R13.10

## 2018-06-06 HISTORY — DX: Hemorrhage of anus and rectum: K62.5

## 2018-06-06 HISTORY — DX: Radiculopathy, lumbar region: M54.16

## 2018-06-06 LAB — CBC WITH DIFFERENTIAL/PLATELET
Abs Immature Granulocytes: 0.03 10*3/uL (ref 0.00–0.07)
Basophils Absolute: 0.1 10*3/uL (ref 0.0–0.1)
Basophils Relative: 1 %
Eosinophils Absolute: 0.3 10*3/uL (ref 0.0–0.5)
Eosinophils Relative: 3 %
HCT: 40.6 % (ref 39.0–52.0)
Hemoglobin: 13.3 g/dL (ref 13.0–17.0)
Immature Granulocytes: 0 %
Lymphocytes Relative: 32 %
Lymphs Abs: 2.8 10*3/uL (ref 0.7–4.0)
MCH: 29.9 pg (ref 26.0–34.0)
MCHC: 32.8 g/dL (ref 30.0–36.0)
MCV: 91.2 fL (ref 80.0–100.0)
Monocytes Absolute: 0.7 10*3/uL (ref 0.1–1.0)
Monocytes Relative: 8 %
Neutro Abs: 4.8 10*3/uL (ref 1.7–7.7)
Neutrophils Relative %: 56 %
Platelets: 278 10*3/uL (ref 150–400)
RBC: 4.45 MIL/uL (ref 4.22–5.81)
RDW: 12.6 % (ref 11.5–15.5)
WBC: 8.8 10*3/uL (ref 4.0–10.5)
nRBC: 0 % (ref 0.0–0.2)

## 2018-06-06 LAB — LIPASE, BLOOD: Lipase: 50 U/L (ref 11–51)

## 2018-06-06 LAB — COMPREHENSIVE METABOLIC PANEL
ALT: 16 U/L (ref 0–44)
AST: 17 U/L (ref 15–41)
Albumin: 4 g/dL (ref 3.5–5.0)
Alkaline Phosphatase: 78 U/L (ref 38–126)
Anion gap: 7 (ref 5–15)
BUN: 9 mg/dL (ref 6–20)
CO2: 24 mmol/L (ref 22–32)
Calcium: 9.2 mg/dL (ref 8.9–10.3)
Chloride: 107 mmol/L (ref 98–111)
Creatinine, Ser: 0.74 mg/dL (ref 0.61–1.24)
GFR calc Af Amer: >60 mL/min (ref >60)
GFR calc non Af Amer: >60 mL/min (ref >60)
Glucose, Bld: 104 mg/dL — ABNORMAL HIGH (ref 70–99)
Potassium: 3.6 mmol/L (ref 3.5–5.1)
Sodium: 138 mmol/L (ref 135–145)
Total Bilirubin: 0.3 mg/dL (ref 0.3–1.2)
Total Protein: 7.1 g/dL (ref 6.5–8.1)

## 2018-06-06 MED ORDER — IOPAMIDOL (ISOVUE-300) INJECTION 61%
100.0000 mL | Freq: Once | INTRAVENOUS | Status: AC | PRN
  Administered 2018-06-06: 100 mL via INTRAVENOUS

## 2018-06-06 NOTE — Discharge Instructions (Addendum)
Take your usual prescriptions as previously directed.  Keep your previously scheduled appointment this month with your GI doctor.  Call your regular medical doctor on Monday to schedule a follow up appointment within the next week.  Return to the Emergency Department immediately sooner if worsening.

## 2018-06-06 NOTE — ED Notes (Signed)
IV removed per pt request.  Dressing and bandaid applied which pt did not want.  Just wanted it pulled out.  Instructed to leave bandage on for a few minutes.  Pt immediately removed and began bleeding.  Pt given another gauze with instructions to hold pressure.

## 2018-06-06 NOTE — ED Triage Notes (Signed)
Patient complains of abdominal pain. States he has not had a bowel movement in 2 weeks. Patient states that he has had small BMs that are small amounts of water. Denies N/V.

## 2018-06-06 NOTE — ED Provider Notes (Signed)
[facility] EMERGENCY DEPARTMENT Provider Note   CSN: [id number] Arrival date & time: 06/06/18  1502     History   Chief Complaint Chief Complaint  Patient presents with  . Constipation    HPI Manuel Richard is a 48 y.o. male.  HPI Pt was seen at 1525.  Per pt, c/o gradual onset and persistence of constant generalized abd "pain" for the past 3 weeks.  Has been associated with "no BM for 3 weeks." Pt states he has taken laxatives with only "some watery and small stool" as well as "passing gas."  Describes the abd pain as "aching."  Denies N/V, no fevers, no back pain, no rash, no CP/SOB, no black or blood in stools, no dysuria/hematuria, no testicular pain/swelling.      Past Medical History:  Diagnosis Date  . Anemia   . Chronic back pain   . Chronic pain syndrome   . Chronic pain syndrome   . Chronic sinusitis   . Dyslipidemia   . Dysphagia   . Epididymitis    Chlamydial, Right  . Erectile dysfunction   . Fatigue   . Groin pain   . High cholesterol   . Hydrocele    bilateral  . Lumbar radiculopathy   . Nicotine addiction   . Rectal bleeding   . Shortness of breath dyspnea   . Stroke (HCC) 2006   x 2     . Testicular pain     Patient Active Problem List   Diagnosis Date Noted  . Anemia 04/20/2018  . Chronic pain syndrome 04/20/2018  . Dyslipidemia 04/20/2018  . Vitamin D deficiency 04/20/2018  . Chronic low back pain (Primary Area of Pain) (Bilateral) (L>R) w/ sciatica (Left) 04/20/2018  . Chronic lower extremity pain (Secondary Area of Pain) (Left) 04/20/2018  . Pharmacologic therapy 04/20/2018  . Disorder of skeletal system 04/20/2018  . Problems influencing health status 04/20/2018  . Long term current use of opiate analgesic 04/20/2018  . 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 Rectal bleeding 03/17/2018  . Dysphagia 03/17/2018  . Chronic low back pain (Midline) w/o sciatica 05/31/2015  . Back pain at L4-L5 level 02/15/2015  . DDD  (degenerative disc disease), lumbosacral 02/15/2015  . Lumbar radiculopathy 02/15/2015  . Drug-seeking behavior 01/12/2015  . Bilateral hydrocele 01/12/2015  . Bilateral varicoceles 01/12/2015  . Orchitis and epididymitis 12/16/2014  . Epididymitis 11/29/2014  . Microscopic hematuria 11/29/2014    Past Surgical History:  Procedure Laterality Date  . APPENDECTOMY          Home Medications    Prior to Admission medications   Medication Sig Start Date End Date Taking? Authorizing Provider  albuterol (PROVENTIL HFA;VENTOLIN HFA) 108 (90 Base) MCG/ACT inhaler Inhale 2 puffs into the lungs every 6 (six) hours as needed for wheezing or shortness of breath.   Yes [provider]  aspirin EC 81 MG tablet Take 81 mg by mouth daily.   Yes [provider]  atorvastatin (LIPITOR) 20 MG tablet Take 1 tablet by mouth daily. 12/24/17  Yes [provider]  furosemide (LASIX) 20 MG tablet TK 1 T PO QD PRN 12/24/17  Yes [provider]  sildenafil (REVATIO) 20 MG tablet Take 5 tablets by mouth as needed. 02/22/18  Yes [provider]  Vitamin D, Ergocalciferol, (DRISDOL) 50000 UNITS CAPS capsule every 7 (seven) days.  12/23/14  Yes [provider]  gabapentin (NEURONTIN) 600 MG tablet TK 1 T PO BID 02/19/18  [provider]    Family History Family History  Problem Relation Age of Onset  . Cancer Mother   . Cancer Father   . Colon cancer Neg Hx   . Colon polyps Neg Hx     Social History Social History   Tobacco Use  . Smoking status: Current Every Day Smoker    Packs/day: 2.00    Types: Cigarettes  . Smokeless tobacco: Never Used  Substance Use Topics  . Alcohol use: No    Alcohol/week: 0.0 standard drinks    Comment: no longer  . Drug use: No     Allergies   Patient has no known allergies.   Review of Systems Review of Systems ROS: Statement: All systems negative except as marked or noted in the HPI;  Constitutional: Negative for fever and chills. ; ; Eyes: Negative for eye pain, redness and discharge. ; ; ENMT: Negative for ear pain, hoarseness, nasal congestion, sinus pressure and sore throat. ; ; Cardiovascular: Negative for chest pain, palpitations, diaphoresis, dyspnea and peripheral edema. ; ; Respiratory: Negative for cough, wheezing and stridor. ; ; Gastrointestinal: +abd pain, constipation. Negative for nausea, vomiting, diarrhea, blood in stool, hematemesis, jaundice and rectal bleeding. . ; ; Genitourinary: Negative for dysuria, flank pain and hematuria. ; ; Genital:  No penile drainage or rash, no testicular pain or swelling, no scrotal rash or swelling. ;;  Musculoskeletal: Negative for back pain and neck pain. Negative for swelling and trauma.; ; Skin: Negative for pruritus, rash, abrasions, blisters, bruising and skin lesion.; ; Neuro: Negative for headache, lightheadedness and neck stiffness. Negative for weakness, altered level of consciousness, altered mental status, extremity weakness, paresthesias, involuntary movement, seizure and syncope.       Physical Exam Updated Vital Signs BP 124/86 (BP Location: Right Arm)   Pulse 90   Temp 97.6 F (36.4 C) (Oral)   Resp (!) 22   Ht 6' (1.829 m)   Wt 124.3 kg   SpO2 98%   BMI 37.16 kg/m   Physical Exam 1530: Physical examination:  Nursing notes reviewed; Vital signs and O2 SAT reviewed;  Constitutional: Well developed, Well nourished, Well hydrated, In no acute distress; Head:  Normocephalic, atraumatic; Eyes: EOMI, PERRL, No scleral icterus; ENMT: Mouth and pharynx normal, Mucous membranes moist; Neck: Supple, Full range of motion, No lymphadenopathy; Cardiovascular: Regular rate and rhythm, No gallop; Respiratory: Breath sounds clear & equal bilaterally, No wheezes.  Speaking full sentences with ease, Normal respiratory effort/excursion; Chest: Nontender, Movement normal; Abdomen: Soft, +diffuse tenderness to palp. No rebound or  guarding. Nondistended, Normal bowel sounds; Genitourinary: No CVA tenderness; Extremities: Peripheral pulses normal, No tenderness, No edema, No calf edema or asymmetry.; Neuro: AA&Ox3, Major CN grossly intact.  Speech clear. No gross focal motor or sensory deficits in extremities. Climbs on and off stretcher easily by himself. Gait steady..; Skin: Color normal, Warm, Dry.   ED Treatments / Results  Labs (all labs ordered are listed, but only abnormal results are displayed)   EKG None  Radiology   Procedures Procedures (including critical care time)  Medications Ordered in ED Medications - No data to display   Initial Impression / Assessment and Plan / ED Course  I have reviewed the triage vital signs and the nursing notes.  Pertinent labs & imaging results that were available during my care of the patient were reviewed by me and considered in my medical decision making (see chart for details).  MDM Reviewed: previous chart, nursing note and  vitals Reviewed previous: labs Interpretation: labs, x-ray and CT scan   Results for orders placed or performed during the hospital encounter of 06/06/18  Comprehensive metabolic panel  Result Value Ref Range   Sodium 138 135 - 145 mmol/L   Potassium 3.6 3.5 - 5.1 mmol/L   Chloride 107 98 - 111 mmol/L   CO2 24 22 - 32 mmol/L   Glucose, Bld 104 (H) 70 - 99 mg/dL   BUN 9 6 - 20 mg/dL   Creatinine, Ser 9.23 0.61 - 1.24 mg/dL   Calcium 9.2 8.9 - 30.0 mg/dL   Total Protein 7.1 6.5 - 8.1 g/dL   Albumin 4.0 3.5 - 5.0 g/dL   AST 17 15 - 41 U/L   ALT 16 0 - 44 U/L   Alkaline Phosphatase 78 38 - 126 U/L   Total Bilirubin 0.3 0.3 - 1.2 mg/dL   GFR calc non Af Amer >60 >60 mL/min   GFR calc Af Amer >60 >60 mL/min   Anion gap 7 5 - 15  Lipase, blood  Result Value Ref Range   Lipase 50 11 - 51 U/L  CBC with Differential  Result Value Ref Range   WBC 8.8 4.0 - 10.5 K/uL   RBC 4.45 4.22 - 5.81 MIL/uL   Hemoglobin 13.3 13.0 - 17.0 g/dL     HCT 76.2 26.3 - 33.5 %   MCV 91.2 80.0 - 100.0 fL   MCH 29.9 26.0 - 34.0 pg   MCHC 32.8 30.0 - 36.0 g/dL   RDW 45.6 25.6 - 38.9 %   Platelets 278 150 - 400 K/uL   nRBC 0.0 0.0 - 0.2 %   Neutrophils Relative % 56 %   Neutro Abs 4.8 1.7 - 7.7 K/uL   Lymphocytes Relative 32 %   Lymphs Abs 2.8 0.7 - 4.0 K/uL   Monocytes Relative 8 %   Monocytes Absolute 0.7 0.1 - 1.0 K/uL   Eosinophils Relative 3 %   Eosinophils Absolute 0.3 0.0 - 0.5 K/uL   Basophils Relative 1 %   Basophils Absolute 0.1 0.0 - 0.1 K/uL   Immature Granulocytes 0 %   Abs Immature Granulocytes 0.03 0.00 - 0.07 K/uL   Dg Chest 2 View Result Date: 06/06/2018 CLINICAL DATA:  Abdominal pain EXAM: CHEST - 2 VIEW COMPARISON:  05/06/2017 FINDINGS: The cardiopericardial silhouette is within normal limits for size. Interstitial markings are diffusely coarsened with chronic features. Stable appearance of scarring at the left base. The visualized bony structures of the thorax are intact. IMPRESSION: Stable. No acute cardiopulmonary findings. Electronically Signed   By: Kennith Center M.D.   On: 06/06/2018 17:04   Ct Abdomen Pelvis W Contrast Result Date: 06/06/2018 CLINICAL DATA:  Abdominal pain and change in bowel habits for 2 weeks. Previous appendectomy. EXAM: CT ABDOMEN AND PELVIS WITH CONTRAST TECHNIQUE: Multidetector CT imaging of the abdomen and pelvis was performed using the standard protocol following bolus administration of intravenous contrast. CONTRAST:  ISOVUE-300 IOPAMIDOL (ISOVUE-300) INJECTION 61% COMPARISON:  None. FINDINGS: Lower Chest: No acute findings. Hepatobiliary: No hepatic masses identified. Gallbladder is unremarkable. No evidence of biliary ductal dilatation. Pancreas:  No mass or inflammatory changes. Spleen: Within normal limits in size and appearance. Adrenals/Urinary Tract: Small bilateral low-attenuation benign adrenal adenomas are seen. A few sub-cm low-attenuation lesions in the right kidney are too  small to characterize but most likely represent tiny cysts. No evidence of ureteral calculi or hydronephrosis. Stomach/Bowel: No evidence of obstruction, inflammatory process or abnormal  fluid collections. Diverticulosis is seen mainly involving the descending and sigmoid colon, however there is no evidence of diverticulitis. Vascular/Lymphatic: No pathologically enlarged lymph nodes. No abdominal aortic aneurysm. Aortic atherosclerosis. Reproductive:  No mass or other significant abnormality. Other:  Small right inguinal hernia is seen containing only fat. Musculoskeletal:  No suspicious bone lesions identified. IMPRESSION: 1. Colonic diverticulosis, without radiographic evidence of diverticulitis or other acute findings. 2. Small benign bilateral adrenal adenomas. Electronically Signed   By: Myles RosenthalJohn  Stahl M.D.   On: 06/06/2018 17:06     1730:  Pt states he does not want to give a urine sample, denies urinary symptoms, and wants to leave right now. Pt's wife states pt already has GI MD appointment scheduled this month and is to have a colonoscopy completed. Pt has tol PO well while in the ED without N/V.  No stooling while in the ED. Pt has been ambulatory around exam room with steady gait, easy resps, NAD. Strongly encouraged to keep his GI MD appt/colonoscopy. Dx and testing d/w pt and family.  Questions answered.  Verb understanding, agreeable to d/c home with outpt f/u.     Final Clinical Impressions(s) / ED Diagnoses   Final diagnoses:  None    ED Discharge Orders    None       Samuel JesterMcManus, Bruce Mayers, DO 06/11/18 1117

## 2018-06-06 NOTE — ED Notes (Signed)
EDP in room talking to patient. Patient t refusing to give urine sample. Patient being discharge, refusing to stay for discharge papers or sign for discharge. EDP aware.

## 2018-06-09 ENCOUNTER — Encounter (HOSPITAL_COMMUNITY): Payer: Self-pay

## 2018-06-09 ENCOUNTER — Ambulatory Visit (HOSPITAL_COMMUNITY): Admit: 2018-06-09 | Payer: Medicare Other | Admitting: Gastroenterology

## 2018-06-09 ENCOUNTER — Ambulatory Visit: Payer: Medicare Other

## 2018-06-09 SURGERY — COLONOSCOPY WITH PROPOFOL
Anesthesia: Monitor Anesthesia Care

## 2018-06-09 NOTE — Telephone Encounter (Signed)
Pt's spouse called office and LMOVM that his TCS for today was suppose to have been cancelled. Called and informed her the TCS had already been cancelled (see previous notes).

## 2018-06-12 ENCOUNTER — Institutional Professional Consult (permissible substitution): Payer: Medicare Other | Admitting: Pulmonary Disease

## 2018-06-15 ENCOUNTER — Encounter: Payer: Self-pay | Admitting: Pulmonary Disease

## 2018-06-24 ENCOUNTER — Institutional Professional Consult (permissible substitution): Payer: Medicare Other | Admitting: Pulmonary Disease

## 2018-07-02 ENCOUNTER — Encounter: Payer: Self-pay | Admitting: *Deleted

## 2018-07-06 ENCOUNTER — Ambulatory Visit: Payer: Medicare Other | Admitting: Diagnostic Neuroimaging

## 2018-07-06 ENCOUNTER — Telehealth: Payer: Self-pay | Admitting: *Deleted

## 2018-07-06 NOTE — Telephone Encounter (Signed)
Patient was no show for new patient appointment today. 

## 2018-07-07 ENCOUNTER — Encounter: Payer: Self-pay | Admitting: Diagnostic Neuroimaging

## 2018-07-22 ENCOUNTER — Ambulatory Visit (INDEPENDENT_AMBULATORY_CARE_PROVIDER_SITE_OTHER): Payer: Medicare Other | Admitting: Pulmonary Disease

## 2018-07-22 ENCOUNTER — Encounter: Payer: Self-pay | Admitting: Pulmonary Disease

## 2018-07-22 VITALS — BP 108/80 | HR 103 | Resp 16 | Ht 72.0 in | Wt 271.0 lb

## 2018-07-22 DIAGNOSIS — J449 Chronic obstructive pulmonary disease, unspecified: Secondary | ICD-10-CM | POA: Diagnosis not present

## 2018-07-22 DIAGNOSIS — F17200 Nicotine dependence, unspecified, uncomplicated: Secondary | ICD-10-CM | POA: Diagnosis not present

## 2018-07-22 DIAGNOSIS — R0609 Other forms of dyspnea: Secondary | ICD-10-CM

## 2018-07-22 DIAGNOSIS — R0600 Dyspnea, unspecified: Secondary | ICD-10-CM

## 2018-07-22 NOTE — Patient Instructions (Signed)
Blood test today: BNP  Continue Stiolto inhaler Work on smoking cessation as we discussed Overnight oximetry ordered Follow-up in 6 to 8 weeks with PFTs (lung function test) prior to that visit

## 2018-07-22 NOTE — Progress Notes (Signed)
PULMONARY CONSULT NOTE  Requesting MD/Service: Kizzie Bane, MD Date of initial consultation: 07/22/2018 Reason for consultation: Smoker, dyspnea  PT PROFILE: 48 y.o. male heavy smoker of up to 2 PPD all of his adult life referred for evaluation of severe exertional dyspnea  DATA:  INTERVAL:  HPI:  As above.  He is a somewhat difficult historian.  He is also somewhat tangential in answers to questions.  He reports class IV dyspnea which is significantly worse over the past 3 months.  There is little day-to-day or moment to moment variation.  He describes chest pain "all the time".  He characterizes this chest pain poorly describing it alternatively as "sharp" and "dull".  The chest pain does not appear to be related to exertion.  He has a Stiolto inhaler which she is used over the past year but cannot discern much benefit from it.  He also has an albuterol nebulizer which he uses 2-3 times per day with modest and short-lived benefit.  Despite his disabling symptoms, he continues to smoke between 1 and 2 packs of cigarettes per day.  He is on disability with no history of significant occupational exposures.  He has 4 cats and 1 dog in the home.  He does not have any unusual hobbies.  He was never in the [employer].  There is no significant travel history.  There is no history of tuberculosis exposure.  Review of systems is notable for orthopnea, paroxysmal nocturnal dyspnea, chronic lower extremity edema which is symmetric.  He denies fever, purulent sputum, hemoptysis.  Past Medical History:  Diagnosis Date  . Anemia   . Chronic back pain   . Chronic pain syndrome   . Chronic pain syndrome   . Chronic sinusitis   . COPD (chronic obstructive pulmonary disease) (HCC)   . Dyslipidemia   . Dysphagia   . Epididymitis    Chlamydial, Right  . Erectile dysfunction   . Fatigue   . GERD (gastroesophageal reflux disease)   . Groin pain   . High cholesterol   . Hydrocele    bilateral  .  Lumbar radiculopathy   . Nicotine addiction   . Rectal bleeding   . Shortness of breath dyspnea   . Stroke (HCC) 2006   x 2     . Testicular pain     Past Surgical History:  Procedure Laterality Date  . APPENDECTOMY      MEDICATIONS: I have reviewed all medications and confirmed regimen as documented  Social History   Socioeconomic History  . Marital status: Married    Spouse name: Not on file  . Number of children: Not on file  . Years of education: Not on file  . Highest education level: Not on file  Occupational History  . Not on file  Social Needs  . Financial resource strain: Not on file  . Food insecurity:    Worry: Not on file    Inability: Not on file  . Transportation needs:    Medical: Not on file    Non-medical: Not on file  Tobacco Use  . Smoking status: Current Every Day Smoker    Packs/day: 2.00    Years: 31.00    Pack years: 62.00    Types: Cigarettes  . Smokeless tobacco: Never Used  Substance and Sexual Activity  . Alcohol use: No    Alcohol/week: 0.0 standard drinks    Comment: no longer  . Drug use: No  . Sexual activity: Not on file  Lifestyle  .  Physical activity:    Days per week: Not on file    Minutes per session: Not on file  . Stress: Not on file  Relationships  . Social connections:    Talks on phone: Not on file    Gets together: Not on file    Attends religious service: Not on file    Active member of club or organization: Not on file    Attends meetings of clubs or organizations: Not on file    Relationship status: Not on file  . Intimate partner violence:    Fear of current or ex partner: Not on file    Emotionally abused: Not on file    Physically abused: Not on file    Forced sexual activity: Not on file  Other Topics Concern  . Not on file  Social History Narrative  . Not on file    Family History  Problem Relation Age of Onset  . Cancer Mother   . Cancer Father   . Colon cancer Neg Hx   . Colon polyps Neg  Hx     ROS: No fever, myalgias/arthralgias, unexplained weight loss or weight gain No new focal weakness or sensory deficits No otalgia, hearing loss, visual changes, nasal and sinus symptoms, mouth and throat problems No neck pain or adenopathy No abdominal pain, N/V/D, diarrhea, change in bowel pattern No dysuria, change in urinary pattern   Vitals:   07/22/18 0902 07/22/18 0904  BP:  108/80  Pulse:  (!) 103  Resp: 16   SpO2:  95%  Weight: 271 lb (122.9 kg)   Height: 6' (1.829 m)      EXAM:  Gen: Moderately obese, unkempt, disheveled with poor general hygiene, no overt distress at rest HEENT: NCAT, sclera white, poor dentition Neck: Supple without LAN, thyromegaly, JVD Lungs: breath sounds full and slightly coarse without wheezes or other adventitious sounds Cardiovascular: RRR, no murmurs noted Abdomen: Soft, nontender, normal BS Ext: without clubbing, cyanosis, edema Neuro: CNs grossly intact, motor and sensory intact Skin: Limited exam, no lesions noted  DATA:   BMP Latest Ref Rng & Units 06/06/2018 04/20/2018  Glucose 70 - 99 mg/dL 967(R) 916(B)  BUN 6 - 20 mg/dL 9 11  Creatinine 8.46 - 1.24 mg/dL 6.59 9.35  BUN/Creat Ratio 9 - 20 - 11  Sodium 135 - 145 mmol/L 138 139  Potassium 3.5 - 5.1 mmol/L 3.6 5.1  Chloride 98 - 111 mmol/L 107 103  CO2 22 - 32 mmol/L 24 -  Calcium 8.9 - 10.3 mg/dL 9.2 9.8    CBC Latest Ref Rng & Units 06/06/2018  WBC 4.0 - 10.5 K/uL 8.8  Hemoglobin 13.0 - 17.0 g/dL 70.1  Hematocrit 77.9 - 52.0 % 40.6  Platelets 150 - 400 K/uL 278    CXR 1/11: No acute findings  I have personally reviewed all chest radiographs reported above including CXRs and CT chest unless otherwise indicated  IMPRESSION:     ICD-10-CM   1. Smoker F17.200 Pulmonary Function Test [facility] Only    Pulse oximetry, overnight  2. Chronic asthmatic bronchitis (HCC) J44.9 Pulmonary Function Test [facility] Only    Pulse oximetry, overnight  3. Class IV dyspnea R06.09 Brain  natriuretic peptide   We had a long discussion regarding smoking cessation.  I made it very clear to him that there is no hope of his respiratory status improving as long as he continues to smoke as he does currently.  In all likelihood, chronic asthmatic bronchitis is the  principal cause of exertional dyspnea.  However, his dyspnea as described does seem to be out of proportion to objective findings on exam.  PLAN:  Check B natruretic peptide today Continue Stiolto inhaler Counseled regarding smoking cessation Overnight oximetry ordered Follow-up in 6-8 weeks with PFTs prior to that visit   Billy Fischeravid Phenix Grein, MD PCCM service Mobile [phone] Pager [phone] 07/22/2018 12:49 PM

## 2018-07-23 ENCOUNTER — Other Ambulatory Visit
Admission: RE | Admit: 2018-07-23 | Discharge: 2018-07-23 | Disposition: A | Discharge status: Home / Self Care | Payer: Medicare Other | Source: Ambulatory Visit | Attending: Pulmonary Disease | Admitting: Pulmonary Disease

## 2018-07-23 DIAGNOSIS — R0600 Dyspnea, unspecified: Secondary | ICD-10-CM

## 2018-07-23 DIAGNOSIS — R0609 Other forms of dyspnea: Principal | ICD-10-CM

## 2018-07-23 NOTE — Addendum Note (Signed)
Addended by: Deloria Lair on: 07/23/2018 11:54 AM   Modules accepted: Orders

## 2018-07-31 ENCOUNTER — Telehealth: Payer: Self-pay | Admitting: Pulmonary Disease

## 2018-07-31 DIAGNOSIS — G4734 Idiopathic sleep related nonobstructive alveolar hypoventilation: Secondary | ICD-10-CM

## 2018-07-31 DIAGNOSIS — R0600 Dyspnea, unspecified: Secondary | ICD-10-CM

## 2018-07-31 NOTE — Telephone Encounter (Signed)
Called and spoke with patients wife, she stated that she is needing results for patients sleep study. Please adivse, thank you.

## 2018-07-31 NOTE — Telephone Encounter (Signed)
Called and spoke to pt.  Pt is concerned that he should be wearing oxygen during the day as well, as he is exteremly sob.  Pt is scheduled to see Dr. Sung Amabile on 09/03/18.  Dr. Sung Amabile please advise if SMW can be ordered to qualify pt for daytime oxygen. Thanks.

## 2018-07-31 NOTE — Telephone Encounter (Signed)
ONO reviewed. Initiate nocturnal O2 @ 2 LPM. Thanks  Theodoro Grist

## 2018-07-31 NOTE — Telephone Encounter (Signed)
Called and spoke to pt's spouse, Crystal (DPR). Crystal is requesting ONO results.  Results have been received and are in DS's folder for review.  Dr. Sung Amabile please advise. Thanks

## 2018-07-31 NOTE — Telephone Encounter (Signed)
Pt's spouse, crystal(DPR) is aware of results and voiced her understanding.  Order has been placed for nocturnal O2 per crystal's request. Crystal stated that she did not need to discuss results with pt prior to ordering. Nothing further is needed.

## 2018-08-01 ENCOUNTER — Other Ambulatory Visit: Payer: Self-pay

## 2018-08-01 ENCOUNTER — Encounter (HOSPITAL_COMMUNITY): Payer: Self-pay | Admitting: *Deleted

## 2018-08-01 ENCOUNTER — Emergency Department (HOSPITAL_COMMUNITY): Payer: Medicare Other

## 2018-08-01 ENCOUNTER — Emergency Department (HOSPITAL_COMMUNITY)
Admission: EM | Admit: 2018-08-01 | Discharge: 2018-08-01 | Disposition: A | Discharge status: Home / Self Care | Payer: Medicare Other | Attending: Emergency Medicine | Admitting: Emergency Medicine

## 2018-08-01 DIAGNOSIS — J441 Chronic obstructive pulmonary disease with (acute) exacerbation: Secondary | ICD-10-CM | POA: Diagnosis not present

## 2018-08-01 DIAGNOSIS — Z79899 Other long term (current) drug therapy: Secondary | ICD-10-CM | POA: Diagnosis not present

## 2018-08-01 DIAGNOSIS — Z7982 Long term (current) use of aspirin: Secondary | ICD-10-CM | POA: Diagnosis not present

## 2018-08-01 DIAGNOSIS — F17210 Nicotine dependence, cigarettes, uncomplicated: Secondary | ICD-10-CM | POA: Diagnosis not present

## 2018-08-01 DIAGNOSIS — R0602 Shortness of breath: Secondary | ICD-10-CM | POA: Diagnosis present

## 2018-08-01 DIAGNOSIS — Z720 Tobacco use: Secondary | ICD-10-CM

## 2018-08-01 MED ORDER — IPRATROPIUM-ALBUTEROL 0.5-2.5 (3) MG/3ML IN SOLN
3.0000 mL | Freq: Once | RESPIRATORY_TRACT | Status: AC
  Administered 2018-08-01: 3 mL via RESPIRATORY_TRACT
  Filled 2018-08-01: qty 3

## 2018-08-01 MED ORDER — PREDNISONE 50 MG PO TABS
60.0000 mg | ORAL_TABLET | Freq: Once | ORAL | Status: AC
  Administered 2018-08-01: 60 mg via ORAL
  Filled 2018-08-01: qty 1

## 2018-08-01 MED ORDER — PREDNISONE 10 MG PO TABS: ORAL_TABLET | ORAL | 0 refills | Status: DC

## 2018-08-01 NOTE — ED Triage Notes (Signed)
Pt c/o sob and cough that is productive with thick sputum that became worse today, gave himself 3 breathing treatments at home with no improvement, was given a duoneb by ems with improvement,

## 2018-08-01 NOTE — ED Notes (Signed)
Pt ambulated to restroom and became tachypnic

## 2018-08-01 NOTE — Discharge Instructions (Addendum)
Try to stop smoking.  Start the prednisone prescription tomorrow.

## 2018-08-01 NOTE — ED Notes (Signed)
Pt refused any blood work or iv's, states that he does not need any needles,

## 2018-08-01 NOTE — ED Provider Notes (Addendum)
[facility] EMERGENCY DEPARTMENT Provider Note   CSN: [id number] Arrival date & time: 08/01/18  2033    History   Chief Complaint Chief Complaint  Patient presents with  . Shortness of Breath    HPI Manuel Richard is a 48 y.o. male.     HPI   He complains of cough, with sputum production for several days.  He is using nebulizers at home without improvement.  He was transferred by EMS who treated him with a DuoNeb, during transport.  Patient smokes cigarettes.  He completed course of prednisone 2 weeks ago.  He is coughing and producing white sputum.  He denies fever, chills, chest pain, weakness or dizziness.  No known sick contacts, currently.  There are no other known modifying factors.  Past Medical History:  Diagnosis Date  . Anemia   . Chronic back pain   . Chronic pain syndrome   . Chronic pain syndrome   . Chronic sinusitis   . COPD (chronic obstructive pulmonary disease) (HCC)   . Dyslipidemia   . Dysphagia   . Epididymitis    Chlamydial, Right  . Erectile dysfunction   . Fatigue   . GERD (gastroesophageal reflux disease)   . Groin pain   . High cholesterol   . Hydrocele    bilateral  . Lumbar radiculopathy   . Nicotine addiction   . Rectal bleeding   . Shortness of breath dyspnea   . Stroke (HCC) 2006   x 2     . Testicular pain     Patient Active Problem List   Diagnosis Date Noted  . Anemia 04/20/2018  . Chronic pain syndrome 04/20/2018  . Dyslipidemia 04/20/2018  . Vitamin D deficiency 04/20/2018  . Chronic low back pain (Primary Area of Pain) (Bilateral) (L>R) w/ sciatica (Left) 04/20/2018  . Chronic lower extremity pain (Secondary Area of Pain) (Left) 04/20/2018  . Pharmacologic therapy 04/20/2018  . Disorder of skeletal system 04/20/2018  . Problems influencing health status 04/20/2018  . Long term current use of opiate analgesic 04/20/2018  . 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 Rectal bleeding 03/17/2018  . Dysphagia  03/17/2018  . Chronic low back pain (Midline) w/o sciatica 05/31/2015  . Back pain at L4-L5 level 02/15/2015  . DDD (degenerative disc disease), lumbosacral 02/15/2015  . Lumbar radiculopathy 02/15/2015  . Drug-seeking behavior 01/12/2015  . Bilateral hydrocele 01/12/2015  . Bilateral varicoceles 01/12/2015  . Orchitis and epididymitis 12/16/2014  . Epididymitis 11/29/2014  . Microscopic hematuria 11/29/2014    Past Surgical History:  Procedure Laterality Date  . APPENDECTOMY          Home Medications    Prior to Admission medications   Medication Sig Start Date End Date Taking? Authorizing Provider  albuterol (PROVENTIL HFA;VENTOLIN HFA) 108 (90 Base) MCG/ACT inhaler Inhale 2 puffs into the lungs every 6 (six) hours as needed for wheezing or shortness of breath.    [provider]  albuterol (PROVENTIL) (2.5 MG/3ML) 0.083% nebulizer solution Take 2.5 mg by nebulization every 6 (six) hours as needed for wheezing or shortness of breath.    [provider]  aspirin EC 81 MG tablet Take 81 mg by mouth daily.    [provider]  atorvastatin (LIPITOR) 20 MG tablet Take 1 tablet by mouth daily. 12/24/17   [provider]  buPROPion (WELLBUTRIN XL) 150 MG 24 hr tablet Take 150 mg by mouth daily.    [provider]  cyclobenzaprine (FLEXERIL) 10  MG tablet Take 10 mg by mouth 3 (three) times daily as needed.    [provider]  diclofenac sodium (VOLTAREN) 1 % GEL Apply topically 4 (four) times daily.    [provider]  furosemide (LASIX) 20 MG tablet TK 1 T PO QD PRN 12/24/17   [provider]  furosemide (LASIX) 20 MG tablet Take 20 mg by mouth.    [provider]  gabapentin (NEURONTIN) 600 MG tablet TK 1 T PO BID 02/19/18   [provider]  HYDROcodone-acetaminophen (NORCO) 7.5-325 MG tablet Take 1 tablet by mouth as directed. 07/17/18   [provider]  LINZESS 145 MCG CAPS capsule Take  145 mcg by mouth daily. 07/09/18   [provider]  omeprazole (PRILOSEC) 40 MG capsule Take 40 mg by mouth daily.    [provider]  predniSONE (DELTASONE) 10 MG tablet Take q day 6,5,4,3,2,1 08/01/18   Mancel Bale, MD  sildenafil (REVATIO) 20 MG tablet Take 5 tablets by mouth as needed. 02/22/18   [provider]  STIOLTO RESPIMAT 2.5-2.5 MCG/ACT AERS Inhale 2 puffs into the lungs daily. 07/05/18   [provider]  Vitamin D, Ergocalciferol, (DRISDOL) 50000 UNITS CAPS capsule every 7 (seven) days.  12/23/14   [provider]    Family History Family History  Problem Relation Age of Onset  . Cancer Mother   . Cancer Father   . Colon cancer Neg Hx   . Colon polyps Neg Hx     Social History Social History   Tobacco Use  . Smoking status: Current Every Day Smoker    Packs/day: 2.00    Years: 31.00    Pack years: 62.00    Types: Cigarettes  . Smokeless tobacco: Never Used  Substance Use Topics  . Alcohol use: No    Alcohol/week: 0.0 standard drinks    Comment: no longer  . Drug use: No     Allergies   Patient has no known allergies.   Review of Systems Review of Systems  All other systems reviewed and are negative.    Physical Exam Updated Vital Signs BP 112/78   Pulse (!) 104   Temp 98.2 F (36.8 C) (Oral)   Resp (!) 24   Ht 6' (1.829 m)   Wt 113.4 kg   SpO2 97%   BMI 33.91 kg/m   Physical Exam Vitals signs and nursing note reviewed.  Constitutional:      General: He is not in acute distress.    Appearance: He is well-developed. He is not ill-appearing, toxic-appearing or diaphoretic.  HENT:     Head: Normocephalic and atraumatic.     Right Ear: External ear normal.     Left Ear: External ear normal.  Eyes:     Conjunctiva/sclera: Conjunctivae normal.     Pupils: Pupils are equal, round, and reactive to light.  Neck:     Musculoskeletal: Normal range of motion and neck supple.     Trachea: Phonation  normal.  Cardiovascular:     Rate and Rhythm: Regular rhythm. Tachycardia present.     Heart sounds: Normal heart sounds.  Pulmonary:     Effort: Pulmonary effort is normal. No respiratory distress.     Breath sounds: No stridor. Wheezing (Few scattered) present. No rhonchi.     Comments: No increased work of breathing Abdominal:     Palpations: Abdomen is soft.     Tenderness: There is no abdominal tenderness.  Musculoskeletal: Normal range of  motion.        General: No swelling or tenderness.  Skin:    General: Skin is warm and dry.  Neurological:     Mental Status: He is alert and oriented to person, place, and time.     Cranial Nerves: No cranial nerve deficit.     Sensory: No sensory deficit.     Motor: No abnormal muscle tone.     Coordination: Coordination normal.  Psychiatric:        Behavior: Behavior normal.        Thought Content: Thought content normal.        Judgment: Judgment normal.      ED Treatments / Results  Labs (all labs ordered are listed, but only abnormal results are displayed) Labs Reviewed - No data to display  EKG None     Date: 08/01/18  Rate: 108  Rhythm: sinus tachycardia  QRS Axis: normal  PR and QT Intervals: normal  ST/T Wave abnormalities: normal  PR and QRS Conduction Disutrbances:none    Radiology Dg Chest Portable 1 View  Result Date: 08/01/2018 CLINICAL DATA:  Shortness of breath and wheezing. EXAM: PORTABLE CHEST 1 VIEW COMPARISON:  Radiographs 06/06/2018 FINDINGS: Chronic interstitial coarsening, unchanged allowing for differences in technique.The cardiomediastinal contours are normal. Mild bibasilar atelectasis. Pulmonary vasculature is normal. No consolidation, pleural effusion, or pneumothorax. No acute osseous abnormalities are seen. IMPRESSION: Chronic interstitial coarsening, unchanged from prior exam. Mild bibasilar atelectasis. Electronically Signed   By: Narda Rutherford M.D.   On: 08/01/2018 20:31     Procedures Procedures (including critical care time)  Medications Ordered in ED Medications  predniSONE (DELTASONE) tablet 60 mg (60 mg Oral Given 08/01/18 2053)  ipratropium-albuterol (DUONEB) 0.5-2.5 (3) MG/3ML nebulizer solution 3 mL (3 mLs Nebulization Given 08/01/18 2057)     Initial Impression / Assessment and Plan / ED Course  I have reviewed the triage vital signs and the nursing notes.  Pertinent labs & imaging results that were available during my care of the patient were reviewed by me and considered in my medical decision making (see chart for details).  Clinical Course as of Jul 31 2345  Sat Aug 01, 2018  2143 CHF, or infiltrate, image reviewed by me  DG Chest Portable 1 View [EW]    Clinical Course User Index [EW] Mancel Bale, MD        Patient Vitals for the past 24 hrs:  BP Temp Temp src Pulse Resp SpO2 Height Weight  08/01/18 2100 112/78 - - (!) 104 - 97 % - -  08/01/18 2057 - - - - - 94 % - -  08/01/18 2043 110/86 98.2 F (36.8 C) Oral (!) 120 (!) 24 94 % - -  08/01/18 2042 - - - - - - 6' (1.829 m) 113.4 kg    9:45 PM Reevaluation with update and discussion. After initial assessment and treatment, an updated evaluation reveals patient more comfortable at this time and has no further complaints.  Findings discussed and questions answered.Mancel Bale   Medical Decision Making: COPD exacerbation, mild patient left normal oxygen saturation on room air 93%.  Doubt pneumonia, heart failure or metabolic instability.  CRITICAL CARE- NO Performed by: Mancel Bale  Nursing Notes Reviewed/ Care Coordinated Applicable Imaging Reviewed Interpretation of Laboratory Data incorporated into ED treatment  The patient appears reasonably screened and/or stabilized for discharge and I doubt any other medical condition or other [facility] requiring further screening, evaluation, or treatment in the ED  at this time prior to discharge.  Plan: Home Medications-continue  usual medication; Home Treatments-stop smoking; return here if the recommended treatment, does not improve the symptoms; Recommended follow up-PCP checkup 1 week and as needed   Final Clinical Impressions(s) / ED Diagnoses   Final diagnoses:  COPD exacerbation (HCC)  Tobacco abuse    ED Discharge Orders         Ordered    predniSONE (DELTASONE) 10 MG tablet     08/01/18 2147           Mancel Bale, MD 08/01/18 1610    Mancel Bale, MD 08/01/18 2348

## 2018-08-03 NOTE — Telephone Encounter (Signed)
Patient aware of needing to quit smoking before daytime 02 prescribed SMW ordered and scheduled. Nothing further needed.

## 2018-08-03 NOTE — Telephone Encounter (Signed)
Pt called to follow up on this  ?

## 2018-08-03 NOTE — Telephone Encounter (Signed)
We can check ambulatory SpO2. Please place order for 6 min walk test. But, emphasize to him that I will not prescribe O2 for daytime until he has quit smoking!!  Thanks  Theodoro Grist

## 2018-08-03 NOTE — Telephone Encounter (Signed)
Returned call and made aware 02 order was pending signature. Per [facility] order faxed to [facility] this am. Made aware they should hear from [employer].  Spouse took patient to ED on Friday for increase SOB. He has qualified for night-time only at this time. They want to know if he should be wearing during the day as well due to SOB.

## 2018-08-04 ENCOUNTER — Ambulatory Visit: Payer: Medicare Other

## 2018-08-10 ENCOUNTER — Encounter: Payer: Self-pay | Admitting: *Deleted

## 2018-08-11 ENCOUNTER — Ambulatory Visit: Admission: RE | Admit: 2018-08-11 | Payer: Medicare Other | Source: Home / Self Care | Admitting: Internal Medicine

## 2018-08-11 ENCOUNTER — Encounter: Admission: RE | Payer: Self-pay | Source: Home / Self Care

## 2018-08-11 SURGERY — COLONOSCOPY WITH PROPOFOL
Anesthesia: General

## 2018-08-27 ENCOUNTER — Ambulatory Visit: Payer: Medicare Other

## 2018-09-03 ENCOUNTER — Encounter: Payer: Self-pay | Admitting: Pulmonary Disease

## 2018-09-03 ENCOUNTER — Ambulatory Visit (INDEPENDENT_AMBULATORY_CARE_PROVIDER_SITE_OTHER): Payer: Medicare Other | Admitting: Pulmonary Disease

## 2018-09-03 DIAGNOSIS — F17200 Nicotine dependence, unspecified, uncomplicated: Secondary | ICD-10-CM | POA: Diagnosis not present

## 2018-09-03 DIAGNOSIS — J449 Chronic obstructive pulmonary disease, unspecified: Secondary | ICD-10-CM

## 2018-09-03 DIAGNOSIS — G4734 Idiopathic sleep related nonobstructive alveolar hypoventilation: Secondary | ICD-10-CM | POA: Diagnosis not present

## 2018-09-03 DIAGNOSIS — R600 Localized edema: Secondary | ICD-10-CM

## 2018-09-03 NOTE — Progress Notes (Signed)
Virtual Visit via Telephone Note COVID-19 pandemic I connected with Manuel Richard on 09/14/18 at  9:00 AM EDT by telephone and verified that I am speaking with the correct person using two identifiers.   I discussed the limitations, risks, security and privacy concerns of performing an evaluation and management service by telephone and the availability of in person appointments. I also discussed with the patient that there may be a patient responsible charge related to this service. The patient expressed understanding and agreed to proceed.   History of Present Illness: Initial encounter with me 02/26 at which time we focussed on need for smoking cessation. BNP and ONO were ordered. He did not get BNP done. ONO revealed 33 mins with SpO2 < 89% and lowest SpO2 of 60%. I ordered nocturnal O2.   Seen in ED 03/07 with COPD exacerbation. Treated with prednisone and nebulized bronchodilators.   Presently, he reports no change in SOB. He continues to smoke but is down to a couple of cigs per day. He reports difficulty walking due to LE pain/ swelling. He is wearing O2 with sleep. He is using Stiolto but is not sure if it helps. He is using albuterol 3-4 per day. He continues to have limiting DOE and chronic cough productive of "phlegm". He denies hemoptysis, CP, fever.     Observations/Objective: Not overtly dyspneic during this telephone encounter  Assessment and Plan:   ICD-10-CM   1. COPD, probably very severe (PFTs not done) J44.9   2. Nocturnal hypoxemia. Nocturnal O2 initiated recently G47.34   3. Bilateral leg edema, chronic. On diuretic therapy.  R60.0   4. Smoker F17.200    Counseled again regarding smoking cessation Continue Stiolto inhaler Continue albuterol inhaler as needed Continue nocturnal oxygen Recommended that he look into compression stockings Further management of LE edema per primary care physician Reschedule PFTs and 6-minute walk test   Follow Up  Instructions: Follow-up in 3 months after above tests have been performed   I discussed the assessment and treatment plan with the patient. The patient was provided an opportunity to ask questions and all were answered. The patient agreed with the plan and demonstrated an understanding of the instructions.   The patient was advised to call back or seek an in-person evaluation if the symptoms worsen or if the condition fails to improve as anticipated.  I provided 23 minutes of non-face-to-face time during this encounter.  Billy Fischer, MD PCCM service Mobile [phone] Pager [phone] 09/14/2018 11:01 AM

## 2018-09-03 NOTE — Patient Instructions (Signed)
Counseled again regarding smoking cessation Continue Stiolto inhaler Continue albuterol inhaler as needed Continue nocturnal oxygen Recommended that he look into compression stockings Further management of LE edema per primary care physician Reschedule PFTs and 6-minute walk test Follow-up in 3 months after above tests have been performed

## 2018-10-01 ENCOUNTER — Ambulatory Visit: Payer: Medicare Other

## 2018-10-16 ENCOUNTER — Emergency Department (HOSPITAL_COMMUNITY): Payer: Medicare Other

## 2018-10-16 ENCOUNTER — Emergency Department (HOSPITAL_COMMUNITY)
Admission: EM | Admit: 2018-10-16 | Discharge: 2018-10-16 | Disposition: A | Discharge status: Home / Self Care | Payer: Medicare Other | Attending: Emergency Medicine | Admitting: Emergency Medicine

## 2018-10-16 ENCOUNTER — Encounter (HOSPITAL_COMMUNITY): Payer: Self-pay | Admitting: Emergency Medicine

## 2018-10-16 DIAGNOSIS — Z7982 Long term (current) use of aspirin: Secondary | ICD-10-CM | POA: Insufficient documentation

## 2018-10-16 DIAGNOSIS — Z79899 Other long term (current) drug therapy: Secondary | ICD-10-CM | POA: Diagnosis not present

## 2018-10-16 DIAGNOSIS — J441 Chronic obstructive pulmonary disease with (acute) exacerbation: Secondary | ICD-10-CM | POA: Insufficient documentation

## 2018-10-16 DIAGNOSIS — Z87891 Personal history of nicotine dependence: Secondary | ICD-10-CM | POA: Insufficient documentation

## 2018-10-16 DIAGNOSIS — Z20828 Contact with and (suspected) exposure to other viral communicable diseases: Secondary | ICD-10-CM | POA: Diagnosis not present

## 2018-10-16 DIAGNOSIS — R0602 Shortness of breath: Secondary | ICD-10-CM | POA: Diagnosis present

## 2018-10-16 LAB — BASIC METABOLIC PANEL
Anion gap: 11 (ref 5–15)
BUN: 5 mg/dL — ABNORMAL LOW (ref 6–20)
CO2: 22 mmol/L (ref 22–32)
Calcium: 8.7 mg/dL — ABNORMAL LOW (ref 8.9–10.3)
Chloride: 103 mmol/L (ref 98–111)
Creatinine, Ser: 0.65 mg/dL (ref 0.61–1.24)
GFR calc Af Amer: >60 mL/min (ref >60)
GFR calc non Af Amer: >60 mL/min (ref >60)
Glucose, Bld: 144 mg/dL — ABNORMAL HIGH (ref 70–99)
Potassium: 3.5 mmol/L (ref 3.5–5.1)
Sodium: 136 mmol/L (ref 135–145)

## 2018-10-16 LAB — CBC WITH DIFFERENTIAL/PLATELET
Abs Immature Granulocytes: 0.03 10*3/uL (ref 0.00–0.07)
Basophils Absolute: 0.1 10*3/uL (ref 0.0–0.1)
Basophils Relative: 1 %
Eosinophils Absolute: 0.9 10*3/uL — ABNORMAL HIGH (ref 0.0–0.5)
Eosinophils Relative: 8 %
HCT: 40.8 % (ref 39.0–52.0)
Hemoglobin: 13.5 g/dL (ref 13.0–17.0)
Immature Granulocytes: 0 %
Lymphocytes Relative: 16 %
Lymphs Abs: 1.8 10*3/uL (ref 0.7–4.0)
MCH: 31 pg (ref 26.0–34.0)
MCHC: 33.1 g/dL (ref 30.0–36.0)
MCV: 93.6 fL (ref 80.0–100.0)
Monocytes Absolute: 0.7 10*3/uL (ref 0.1–1.0)
Monocytes Relative: 6 %
Neutro Abs: 8.1 10*3/uL — ABNORMAL HIGH (ref 1.7–7.7)
Neutrophils Relative %: 69 %
Platelets: 290 10*3/uL (ref 150–400)
RBC: 4.36 MIL/uL (ref 4.22–5.81)
RDW: 13 % (ref 11.5–15.5)
WBC: 11.5 10*3/uL — ABNORMAL HIGH (ref 4.0–10.5)
nRBC: 0 % (ref 0.0–0.2)

## 2018-10-16 LAB — SARS CORONAVIRUS 2 BY RT PCR (HOSPITAL ORDER, PERFORMED IN ~~LOC~~ HOSPITAL LAB): SARS Coronavirus 2: NEGATIVE

## 2018-10-16 LAB — BLOOD GAS, VENOUS
Acid-base deficit: 1.5 mmol/L (ref 0.0–2.0)
Bicarbonate: 23.3 mmol/L (ref 20.0–28.0)
FIO2: 32
O2 Saturation: 94.3 %
Patient temperature: 36.7
pCO2, Ven: 37.3 mmHg — ABNORMAL LOW (ref 44.0–60.0)
pH, Ven: 7.4 (ref 7.250–7.430)
pO2, Ven: 78.6 mmHg — ABNORMAL HIGH (ref 32.0–45.0)

## 2018-10-16 LAB — TROPONIN I: Troponin I: <0.03 ng/mL (ref <0.03)

## 2018-10-16 LAB — BRAIN NATRIURETIC PEPTIDE: B Natriuretic Peptide: 22 pg/mL (ref 0.0–100.0)

## 2018-10-16 MED ORDER — PREDNISONE 10 MG (21) PO TBPK: ORAL_TABLET | Freq: Every day | ORAL | 0 refills | Status: DC

## 2018-10-16 MED ORDER — ALBUTEROL SULFATE HFA 108 (90 BASE) MCG/ACT IN AERS
4.0000 | INHALATION_SPRAY | Freq: Once | RESPIRATORY_TRACT | Status: AC
  Administered 2018-10-16: 4 via RESPIRATORY_TRACT
  Filled 2018-10-16: qty 6.7

## 2018-10-16 MED ORDER — MAGNESIUM SULFATE 2 GM/50ML IV SOLN
2.0000 g | Freq: Once | INTRAVENOUS | Status: AC
  Administered 2018-10-16: 2 g via INTRAVENOUS
  Filled 2018-10-16: qty 50

## 2018-10-16 MED ORDER — ALBUTEROL SULFATE (2.5 MG/3ML) 0.083% IN NEBU: 5.0000 mg | INHALATION_SOLUTION | Freq: Once | RESPIRATORY_TRACT | Status: DC

## 2018-10-16 NOTE — ED Notes (Signed)
Dr Charm Barges in to speak with pt

## 2018-10-16 NOTE — ED Notes (Signed)
Pt refuses to stay   Has stated he would stay only until covid resulted   Continues to refuse to wear mask

## 2018-10-16 NOTE — ED Notes (Signed)
Pt reports that he has been short of breath since last night   He also reports"have you been to [place]" He refuses to wear a mask stating that "the restrictions are lifted" In spite of being told he needs to wear on e in the department, he refuses- He speaks in complete sentences  He fights the covid test in spite of being told he needs to stay still- tolerated it very poorly shouting out and moving head   Labs are drawn after pt demands something to drink- he is provided ice chips

## 2018-10-16 NOTE — Progress Notes (Signed)
Pt complaining of SOB. Speaks in complete sentences, eating ice chips and had several phone conversations. Pt placed on 3L [place]. MDI given with spacer. VS are stable

## 2018-10-16 NOTE — ED Triage Notes (Signed)
Shortness of breath since last night   Met truck at home and had neb enroute  Smoker x 31 yrs 2 ppd  Stopped yesterday   Now with medsternal CP   Glucose 99 

## 2018-10-16 NOTE — ED Notes (Signed)
covid collected   Lab in to draw

## 2018-10-16 NOTE — Discharge Instructions (Signed)
You were seen in the emergency department for worsening shortness of breath.  It will be important for you to continue to not smoke.  You were tested for Covid that was negative.  Your chest x-ray did not show any obvious pneumonia.  We are prescribing you a prednisone taper.  Please continue your nebulizer treatments and oxygen as needed.  Follow-up with your doctor and your pulmonologist.  Return if any worsening symptoms.

## 2018-10-16 NOTE — ED Notes (Signed)
Pt reports he is not breathing any better   Speaking in complete sentences   appears very anxious   On phone with his sister

## 2018-10-16 NOTE — ED Provider Notes (Signed)
[facility] EMERGENCY DEPARTMENT Provider Note   CSN: [id number] Arrival date & time: 10/16/18  1722    History   Chief Complaint Chief Complaint  Patient presents with   Shortness of Breath    HPI Manuel Richard is a 48 y.o. male.  He has a history of COPD and chronic pain.  He is on home oxygen.  He just stopped smoking yesterday.  He is complaining of increased shortness of breath since 9 PM last night.  It is associated with some chest tightness.  He has had some peripheral edema.  No fevers.  Nonproductive cough.     The history is provided by the patient.  Shortness of Breath  Severity:  Severe Onset quality:  Gradual Duration:  20 hours Timing:  Constant Progression:  Unchanged Chronicity:  Recurrent Context: activity   Relieved by:  Nothing Worsened by:  Activity Ineffective treatments:  Diuretics, oxygen, inhaler and rest Associated symptoms: chest pain, cough, PND and wheezing   Associated symptoms: no abdominal pain, no diaphoresis, no fever, no headaches, no hemoptysis, no neck pain, no rash, no sore throat, no sputum production, no syncope and no vomiting   Risk factors: tobacco use     Past Medical History:  Diagnosis Date   Anemia    Chronic back pain    Chronic pain syndrome    Chronic pain syndrome    Chronic sinusitis    COPD (chronic obstructive pulmonary disease) (HCC)    Dyslipidemia    Dysphagia    Epididymitis    Chlamydial, Right   Erectile dysfunction    Fatigue    GERD (gastroesophageal reflux disease)    Groin pain    High cholesterol    Hydrocele    bilateral   Lumbar radiculopathy    Nicotine addiction    Rectal bleeding    Shortness of breath dyspnea    Stroke (HCC) 2006   x 2      Testicular pain     Patient Active Problem List   Diagnosis Date Noted   Anemia 04/20/2018   Chronic pain syndrome 04/20/2018   Dyslipidemia 04/20/2018   Vitamin D deficiency 04/20/2018   Chronic low back pain  (Primary Area of Pain) (Bilateral) (L>R) w/ sciatica (Left) 04/20/2018   Chronic lower extremity pain (Secondary Area of Pain) (Left) 04/20/2018   Pharmacologic therapy 04/20/2018   Disorder of skeletal system 04/20/2018   Problems influencing health status 04/20/2018   Long term current use of opiate analgesic 04/20/2018   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/20/2018   Rectal bleeding 03/17/2018   Dysphagia 03/17/2018   Chronic low back pain (Midline) w/o sciatica 05/31/2015   Back pain at L4-L5 level 02/15/2015   DDD (degenerative disc disease), lumbosacral 02/15/2015   Lumbar radiculopathy 02/15/2015   Drug-seeking behavior 01/12/2015   Bilateral hydrocele 01/12/2015   Bilateral varicoceles 01/12/2015   Orchitis and epididymitis 12/16/2014   Epididymitis 11/29/2014   Microscopic hematuria 11/29/2014    Past Surgical History:  Procedure Laterality Date   APPENDECTOMY          Home Medications    Prior to Admission medications   Medication Sig Start Date End Date Taking? Authorizing Provider  albuterol (PROVENTIL HFA;VENTOLIN HFA) 108 (90 Base) MCG/ACT inhaler Inhale 2 puffs into the lungs every 6 (six) hours as needed for wheezing or shortness of breath.    [provider]  albuterol (PROVENTIL) (2.5 MG/3ML) 0.083% nebulizer solution Take 2.5 mg by nebulization every 6 (six)  hours as needed for wheezing or shortness of breath.    [provider]  aspirin EC 81 MG tablet Take 81 mg by mouth daily.    [provider]  atorvastatin (LIPITOR) 20 MG tablet Take 1 tablet by mouth daily. 12/24/17   [provider]  buPROPion (WELLBUTRIN XL) 150 MG 24 hr tablet Take 150 mg by mouth daily.    [provider]  cyclobenzaprine (FLEXERIL) 10 MG tablet Take 10 mg by mouth 3 (three) times daily as needed.    [provider]  diclofenac sodium (VOLTAREN) 1 % GEL Apply topically 4 (four) times daily.    [provider]  furosemide (LASIX) 20 MG tablet TK 1 T PO QD PRN 12/24/17   [provider]  furosemide (LASIX) 20 MG tablet Take 20 mg by mouth.    [provider]  gabapentin (NEURONTIN) 600 MG tablet TK 1 T PO BID 02/19/18   [provider]  HYDROcodone-acetaminophen (NORCO) 7.5-325 MG tablet Take 1 tablet by mouth as directed. 07/17/18   [provider]  LINZESS 145 MCG CAPS capsule Take 145 mcg by mouth daily. 07/09/18   [provider]  omeprazole (PRILOSEC) 40 MG capsule Take 40 mg by mouth daily.    [provider]  predniSONE (DELTASONE) 10 MG tablet Take q day 6,5,4,3,2,1 08/01/18   Mancel Bale, MD  sildenafil (REVATIO) 20 MG tablet Take 5 tablets by mouth as needed. 02/22/18   [provider]  STIOLTO RESPIMAT 2.5-2.5 MCG/ACT AERS Inhale 2 puffs into the lungs daily. 07/05/18   [provider]  Vitamin D, Ergocalciferol, (DRISDOL) 50000 UNITS CAPS capsule every 7 (seven) days.  12/23/14   [provider]    Family History Family History  Problem Relation Age of Onset   Cancer Mother    Cancer Father    Colon cancer Neg Hx    Colon polyps Neg Hx     Social History Social History   Tobacco Use   Smoking status: Former Smoker    Packs/day: 2.00    Years: 31.00    Pack years: 62.00    Types: Cigarettes    Last attempt to quit: 10/15/2018   Smokeless tobacco: Never Used  Substance Use Topics   Alcohol use: No    Alcohol/week: 0.0 standard drinks    Comment: no longer   Drug use: No     Allergies   Patient has no known allergies.   Review of Systems Review of Systems  Constitutional: Negative for diaphoresis and fever.  HENT: Negative for sore throat.   Eyes: Negative for visual disturbance.  Respiratory: Positive for cough, shortness of breath and wheezing. Negative for hemoptysis and sputum production.   Cardiovascular: Positive for chest pain and PND. Negative for syncope.    Gastrointestinal: Negative for abdominal pain and vomiting.  Genitourinary: Negative for dysuria.  Musculoskeletal: Negative for neck pain.  Skin: Negative for rash.  Neurological: Negative for headaches.     Physical Exam Updated Vital Signs BP 119/75 (BP Location: Left Arm)    Pulse (!) 113    Temp 98 F (36.7 C) (Oral)    Resp (!) 24    Ht 6' (1.829 m)    Wt 125.6 kg    SpO2 94%    BMI 37.57 kg/m   Physical Exam Vitals signs and nursing note reviewed.  Constitutional:      Appearance: He is well-developed.  HENT:     Head: Normocephalic  and atraumatic.  Eyes:     Conjunctiva/sclera: Conjunctivae normal.  Neck:     Musculoskeletal: Neck supple.  Cardiovascular:     Rate and Rhythm: Normal rate and regular rhythm.     Heart sounds: No murmur.  Pulmonary:     Effort: Tachypnea and accessory muscle usage present. No respiratory distress.     Breath sounds: Wheezing present.  Abdominal:     Palpations: Abdomen is soft.     Tenderness: There is no abdominal tenderness. There is no guarding.  Musculoskeletal:     Right lower leg: He exhibits no tenderness. Edema present.     Left lower leg: He exhibits no tenderness. Edema present.  Skin:    General: Skin is warm and dry.     Capillary Refill: Capillary refill takes less than 2 seconds.  Neurological:     General: No focal deficit present.     Mental Status: He is alert and oriented to person, place, and time.      ED Treatments / Results  Labs (all labs ordered are listed, but only abnormal results are displayed) Labs Reviewed  BASIC METABOLIC PANEL - Abnormal; Notable for the following components:      Result Value   Glucose, Bld 144 (*)    BUN 5 (*)    Calcium 8.7 (*)    All other components within normal limits  CBC WITH DIFFERENTIAL/PLATELET - Abnormal; Notable for the following components:   WBC 11.5 (*)    Neutro Abs 8.1 (*)    Eosinophils Absolute 0.9 (*)    All other components within normal limits   BLOOD GAS, VENOUS - Abnormal; Notable for the following components:   pCO2, Ven 37.3 (*)    pO2, Ven 78.6 (*)    All other components within normal limits  SARS CORONAVIRUS 2 (HOSPITAL ORDER, PERFORMED IN [place] [facility])  TROPONIN I  BRAIN NATRIURETIC PEPTIDE    EKG EKG Interpretation  Date/Time:  Friday Oct 16 2018 17:26:56 EDT Ventricular Rate:  110 PR Interval:    QRS Duration: 105 QT Interval:  346 QTC Calculation: 468 R Axis:   78 Text Interpretation:  Sinus tachycardia similar to prior 3/20 Confirmed by Meridee Score [phone]) on 10/16/2018 5:30:48 PM   Radiology Dg Chest Port 1 View  Result Date: 10/16/2018 CLINICAL DATA:  Shortness of breath EXAM: PORTABLE CHEST 1 VIEW COMPARISON:  08/01/2018 FINDINGS: The heart size and mediastinal contours are within normal limits. Both lungs are clear. The visualized skeletal structures are unremarkable. IMPRESSION: No active disease. Electronically Signed   By: Deatra Robinson M.D.   On: 10/16/2018 18:12    Procedures Procedures (including critical care time)  Medications Ordered in ED Medications  magnesium sulfate IVPB 2 g 50 mL (2 g Intravenous New Bag/Given 10/16/18 1815)  albuterol (VENTOLIN HFA) 108 (90 Base) MCG/ACT inhaler 4 puff (4 puffs Inhalation Given 10/16/18 1808)     Initial Impression / Assessment and Plan / ED Course  I have reviewed the triage vital signs and the nursing notes.  Pertinent labs & imaging results that were available during my care of the patient were reviewed by me and considered in my medical decision making (see chart for details).  Clinical Course as of Oct 15 2001  Fri Oct 16, 2018  3248 48 year old male with history of COPD on home oxygen here with worsening shortness of breath since last evening.  No fever but there has been a cough.  Some vague chest  tightness.  Some peripheral edema.  He said he has been taking his medications regular.  He just stop smoking yesterday.   Differential diagnosis includes COPD exacerbation, ACS, CHF, PE, pneumonia, Covid, anemia.  Lab work coming back and is fairly unremarkable including a negative troponin.  White blood cell count slightly elevated at 11.5.  Chest x-ray unremarkable reviewed by me.  EKG unremarkable reviewed by me.  He received steroids prior to arrival by EMS along with a breathing treatment.  Have given him 2 g of mag and albuterol inhaler.  Heart rate improving from tachycardia on arrival.  He is dyspneic and wheezing although speaking in full sentences.  Will likely need to be admitted for COPD exacerbation.   [MB]  1912 Patient's BNP is normal.  I reviewed all this with him.  He says he is feeling better and feels like he would do well at home.  He [facility] has oxygen and nebulizers there he just wanted a prescription for some steroids.  He understands to follow-up with his doctor and return if any worsening symptoms.   [MB]    Clinical Course User Index [MB] Terrilee FilesButler, Fidencia Mccloud C, MD      Manuel Richard was evaluated in Emergency Department on 10/16/2018 for the symptoms described in the history of present illness. He was evaluated in the context of the global COVID-19 pandemic, which necessitated consideration that the patient might be at risk for infection with the SARS-CoV-2 virus that causes COVID-19. Institutional protocols and algorithms that pertain to the evaluation of patients at risk for COVID-19 are in a state of rapid change based on information released by regulatory bodies including the CDC and federal and state organizations. These policies and algorithms were followed during the patient's care in the ED.   Final Clinical Impressions(s) / ED Diagnoses   Final diagnoses:  COPD exacerbation [facility](HCC)    ED Discharge Orders         Ordered    predniSONE (STERAPRED UNI-PAK 21 TAB) 10 MG (21) TBPK tablet  Daily     10/16/18 1914           Terrilee FilesButler, Reyan Helle C, MD 10/16/18 2004

## 2018-10-17 ENCOUNTER — Encounter (HOSPITAL_COMMUNITY): Payer: Self-pay | Admitting: Emergency Medicine

## 2018-10-17 ENCOUNTER — Other Ambulatory Visit: Payer: Self-pay

## 2018-10-17 ENCOUNTER — Emergency Department (HOSPITAL_COMMUNITY)
Admission: EM | Admit: 2018-10-17 | Discharge: 2018-10-17 | Disposition: A | Discharge status: Home / Self Care | Payer: Medicare Other | Attending: Emergency Medicine | Admitting: Emergency Medicine

## 2018-10-17 ENCOUNTER — Emergency Department (HOSPITAL_COMMUNITY): Payer: Medicare Other

## 2018-10-17 DIAGNOSIS — J441 Chronic obstructive pulmonary disease with (acute) exacerbation: Secondary | ICD-10-CM

## 2018-10-17 DIAGNOSIS — Z5320 Procedure and treatment not carried out because of patient's decision for unspecified reasons: Secondary | ICD-10-CM | POA: Diagnosis not present

## 2018-10-17 DIAGNOSIS — Z87891 Personal history of nicotine dependence: Secondary | ICD-10-CM | POA: Diagnosis not present

## 2018-10-17 DIAGNOSIS — R05 Cough: Secondary | ICD-10-CM | POA: Diagnosis not present

## 2018-10-17 DIAGNOSIS — R0602 Shortness of breath: Secondary | ICD-10-CM | POA: Diagnosis not present

## 2018-10-17 DIAGNOSIS — Y92512 Supermarket, store or market as the place of occurrence of the external cause: Secondary | ICD-10-CM | POA: Diagnosis not present

## 2018-10-17 DIAGNOSIS — Z8673 Personal history of transient ischemic attack (TIA), and cerebral infarction without residual deficits: Secondary | ICD-10-CM | POA: Insufficient documentation

## 2018-10-17 DIAGNOSIS — Y998 Other external cause status: Secondary | ICD-10-CM | POA: Insufficient documentation

## 2018-10-17 DIAGNOSIS — Z79899 Other long term (current) drug therapy: Secondary | ICD-10-CM | POA: Insufficient documentation

## 2018-10-17 DIAGNOSIS — Y9389 Activity, other specified: Secondary | ICD-10-CM | POA: Insufficient documentation

## 2018-10-17 DIAGNOSIS — S0990XA Unspecified injury of head, initial encounter: Secondary | ICD-10-CM

## 2018-10-17 DIAGNOSIS — R55 Syncope and collapse: Secondary | ICD-10-CM | POA: Diagnosis not present

## 2018-10-17 LAB — CBC WITH DIFFERENTIAL/PLATELET
Abs Immature Granulocytes: 0.1 10*3/uL — ABNORMAL HIGH (ref 0.00–0.07)
Basophils Absolute: 0 10*3/uL (ref 0.0–0.1)
Basophils Relative: 0 %
Eosinophils Absolute: 0.1 10*3/uL (ref 0.0–0.5)
Eosinophils Relative: 1 %
HCT: 37.6 % — ABNORMAL LOW (ref 39.0–52.0)
Hemoglobin: 12.3 g/dL — ABNORMAL LOW (ref 13.0–17.0)
Immature Granulocytes: 1 %
Lymphocytes Relative: 11 %
Lymphs Abs: 2.1 10*3/uL (ref 0.7–4.0)
MCH: 31.1 pg (ref 26.0–34.0)
MCHC: 32.7 g/dL (ref 30.0–36.0)
MCV: 94.9 fL (ref 80.0–100.0)
Monocytes Absolute: 1.8 10*3/uL — ABNORMAL HIGH (ref 0.1–1.0)
Monocytes Relative: 9 %
Neutro Abs: 15.3 10*3/uL — ABNORMAL HIGH (ref 1.7–7.7)
Neutrophils Relative %: 78 %
Platelets: 304 10*3/uL (ref 150–400)
RBC: 3.96 MIL/uL — ABNORMAL LOW (ref 4.22–5.81)
RDW: 13.2 % (ref 11.5–15.5)
WBC: 19.5 10*3/uL — ABNORMAL HIGH (ref 4.0–10.5)
nRBC: 0 % (ref 0.0–0.2)

## 2018-10-17 LAB — BASIC METABOLIC PANEL
Anion gap: 11 (ref 5–15)
BUN: 11 mg/dL (ref 6–20)
CO2: 22 mmol/L (ref 22–32)
Calcium: 9.2 mg/dL (ref 8.9–10.3)
Chloride: 106 mmol/L (ref 98–111)
Creatinine, Ser: 0.85 mg/dL (ref 0.61–1.24)
GFR calc Af Amer: >60 mL/min (ref >60)
GFR calc non Af Amer: >60 mL/min (ref >60)
Glucose, Bld: 154 mg/dL — ABNORMAL HIGH (ref 70–99)
Potassium: 4.2 mmol/L (ref 3.5–5.1)
Sodium: 139 mmol/L (ref 135–145)

## 2018-10-17 LAB — BLOOD GAS, VENOUS
Acid-Base Excess: 0.1 mmol/L (ref 0.0–2.0)
Bicarbonate: 24.8 mmol/L (ref 20.0–28.0)
FIO2: 28
O2 Saturation: 91.3 %
Patient temperature: 37
pCO2, Ven: 34.1 mmHg — ABNORMAL LOW (ref 44.0–60.0)
pH, Ven: 7.455 — ABNORMAL HIGH (ref 7.250–7.430)
pO2, Ven: 61.7 mmHg — ABNORMAL HIGH (ref 32.0–45.0)

## 2018-10-17 LAB — TROPONIN I: Troponin I: <0.03 ng/mL (ref <0.03)

## 2018-10-17 MED ORDER — IOHEXOL 300 MG/ML  SOLN: 75.0000 mL | Freq: Once | INTRAMUSCULAR | Status: DC | PRN

## 2018-10-17 MED ORDER — IOHEXOL 350 MG/ML SOLN
75.0000 mL | Freq: Once | INTRAVENOUS | Status: AC | PRN
  Administered 2018-10-17: 75 mL via INTRAVENOUS

## 2018-10-17 NOTE — ED Notes (Signed)
Crystal Hladky (pt's wife)  Would like an update when results start coming back  [phone]

## 2018-10-17 NOTE — ED Notes (Signed)
Pt able to ambulate w/o difficulties. Threatening to leave AMA unless we give him something to drink. With MD approval, gave pt sprite to drink and removed IV to avoid pt leaving with IV in. RN aware.

## 2018-10-17 NOTE — ED Notes (Signed)
Pt speaking with Dr Clarene Duke, refusing to wait for discharge paperwork, refusing any other evaluation, pt seen walking down the hallway,

## 2018-10-17 NOTE — ED Notes (Signed)
Pt refuse to wear mask last night repeatedly stating that  1. The guildlines had been lifted 2. He was not wearing one 3. He could not breathe with mask on   He continues to refuse/resist mask use  Dr Charm Barges in to assess

## 2018-10-17 NOTE — ED Triage Notes (Signed)
Pt was here last night encouraged to stay  Today here due to went to [employer] scooter and fell off scooter due to could not breathe Has small hematoma to back of the head  O2 sat 91 at scene, extreme sob with exertion  Temp 99.9 BS 143  Here for eval

## 2018-10-17 NOTE — ED Provider Notes (Signed)
Pt told ED RN he "was leaving." Again, recommended admission for COPD exacerbation as previous EDP notes x2 days. Pt refuses and states he "is leaving." Pt has gotten himself dressed and is standing at his exam room doorway calling for his ride. ED RN removed pt's IV and he walked out of the ED. NAD, stable.    Samuel Jester, DO 10/17/18 2057

## 2018-10-17 NOTE — ED Provider Notes (Signed)
[facility] EMERGENCY DEPARTMENT Provider Note   CSN: [id number] Arrival date & time: 10/17/18  1823    History   Chief Complaint Chief Complaint  Patient presents with  . Shortness of Breath    HPI Manuel Richard is a 48 y.o. male.  He has a history of severe COPD and chronic back pain.  He was just seen by me last evening for worsening shortness of breath and treated with steroids.  He was offered admission but declined.  He said today he was in [place] on her scooter and he fainted and fell off the scooter and hit his head.  He also said he fainted yesterday morning while sitting on the front steps.  He was not using his oxygen due to the fact that he has no portable oxygen device.  He just stopped smoking 2 days ago.  He is complaining of some posterior head pain.  He continues to be short of breath and have a cough productive of some clearish phlegm.  No known fever.  He was Covid negative yesterday.     The history is provided by the patient.  Shortness of Breath  Severity:  Severe Onset quality:  Gradual Timing:  Constant Progression:  Unchanged Chronicity:  Recurrent Context: activity   Relieved by:  Nothing Worsened by:  Activity Ineffective treatments:  Inhaler Associated symptoms: cough, headaches, sputum production, syncope and wheezing   Associated symptoms: no abdominal pain, no chest pain, no fever, no hemoptysis, no rash and no sore throat   Risk factors: obesity   Risk factors: no hx of PE/DVT     Past Medical History:  Diagnosis Date  . Anemia   . Chronic back pain   . Chronic pain syndrome   . Chronic pain syndrome   . Chronic sinusitis   . COPD (chronic obstructive pulmonary disease) (HCC)   . Dyslipidemia   . Dysphagia   . Epididymitis    Chlamydial, Right  . Erectile dysfunction   . Fatigue   . GERD (gastroesophageal reflux disease)   . Groin pain   . High cholesterol   . Hydrocele    bilateral  . Lumbar radiculopathy   . Nicotine  addiction   . Rectal bleeding   . Shortness of breath dyspnea   . Stroke (HCC) 2006   x 2     . Testicular pain     Patient Active Problem List   Diagnosis Date Noted  . Anemia 04/20/2018  . Chronic pain syndrome 04/20/2018  . Dyslipidemia 04/20/2018  . Vitamin D deficiency 04/20/2018  . Chronic low back pain (Primary Area of Pain) (Bilateral) (L>R) w/ sciatica (Left) 04/20/2018  . Chronic lower extremity pain (Secondary Area of Pain) (Left) 04/20/2018  . Pharmacologic therapy 04/20/2018  . Disorder of skeletal system 04/20/2018  . Problems influencing health status 04/20/2018  . Long term current use of opiate analgesic 04/20/2018  . 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 Rectal bleeding 03/17/2018  . Dysphagia 03/17/2018  . Chronic low back pain (Midline) w/o sciatica 05/31/2015  . Back pain at L4-L5 level 02/15/2015  . DDD (degenerative disc disease), lumbosacral 02/15/2015  . Lumbar radiculopathy 02/15/2015  . Drug-seeking behavior 01/12/2015  . Bilateral hydrocele 01/12/2015  . Bilateral varicoceles 01/12/2015  . Orchitis and epididymitis 12/16/2014  . Epididymitis 11/29/2014  . Microscopic hematuria 11/29/2014    Past Surgical History:  Procedure Laterality Date  . APPENDECTOMY          Home Medications  Prior to Admission medications   Medication Sig Start Date End Date Taking? Authorizing Provider  albuterol (PROVENTIL HFA;VENTOLIN HFA) 108 (90 Base) MCG/ACT inhaler Inhale 2 puffs into the lungs every 6 (six) hours as needed for wheezing or shortness of breath.    [provider]  albuterol (PROVENTIL) (2.5 MG/3ML) 0.083% nebulizer solution Take 2.5 mg by nebulization every 6 (six) hours as needed for wheezing or shortness of breath.    [provider]  aspirin EC 81 MG tablet Take 81 mg by mouth daily.    [provider]  atorvastatin (LIPITOR) 40 MG tablet Take 40 mg by mouth daily at 6 PM.  08/16/18   [provider]  buPROPion (WELLBUTRIN XL) 150 MG 24 hr tablet Take 150 mg by mouth daily.    [provider]  furosemide (LASIX) 20 MG tablet Take 20 mg by mouth daily.  12/24/17   [provider]  HYDROcodone-acetaminophen (NORCO) 7.5-325 MG tablet Take 1 tablet by mouth as directed. 07/17/18   [provider]  LINZESS 145 MCG CAPS capsule Take 145 mcg by mouth daily. 07/09/18   [provider]  omeprazole (PRILOSEC) 40 MG capsule Take 40 mg by mouth daily.    [provider]  predniSONE (STERAPRED UNI-PAK 21 TAB) 10 MG (21) TBPK tablet Take by mouth daily. Take 6 tabs by mouth daily  for 2 days, then 5 tabs for 2 days, then 4 tabs for 2 days, then 3 tabs for 2 days, 2 tabs for 2 days, then 1 tab by mouth daily for 2 days 10/16/18   Terrilee Files, MD  sildenafil (REVATIO) 20 MG tablet Take 5 tablets by mouth as needed. 02/22/18   [provider]  STIOLTO RESPIMAT 2.5-2.5 MCG/ACT AERS Inhale 2 puffs into the lungs daily. 07/05/18   [provider]  Vitamin D, Ergocalciferol, (DRISDOL) 50000 UNITS CAPS capsule every 7 (seven) days.  12/23/14   [provider]    Family History Family History  Problem Relation Age of Onset  . Cancer Mother   . Cancer Father   . Colon cancer Neg Hx   . Colon polyps Neg Hx     Social History Social History   Tobacco Use  . Smoking status: Former Smoker    Packs/day: 2.00    Years: 31.00    Pack years: 62.00    Types: Cigarettes    Last attempt to quit: 10/15/2018  . Smokeless tobacco: Never Used  Substance Use Topics  . Alcohol use: No    Alcohol/week: 0.0 standard drinks    Comment: no longer  . Drug use: No     Allergies   Patient has no known allergies.   Review of Systems Review of Systems  Constitutional: Negative for fever.  HENT: Negative for sore throat.   Eyes: Negative for visual disturbance.  Respiratory: Positive for cough, sputum production, shortness of  breath and wheezing. Negative for hemoptysis.   Cardiovascular: Positive for syncope. Negative for chest pain.  Gastrointestinal: Negative for abdominal pain.  Genitourinary: Negative for dysuria.  Musculoskeletal: Positive for back pain.  Skin: Negative for rash.  Neurological: Positive for headaches.     Physical Exam Updated Vital Signs BP 105/78 (BP Location: Right Arm)   Pulse (!) 11   Resp (!) 26   Ht 6' (1.829 m)   Wt 125 kg   SpO2 95%   BMI 37.37 kg/m  Pulse was about 110 Physical Exam Vitals signs and  nursing note reviewed.  Constitutional:      Appearance: He is well-developed.  HENT:     Head: Normocephalic.     Comments: He has a small posterior contusion. Eyes:     Conjunctiva/sclera: Conjunctivae normal.  Neck:     Musculoskeletal: Neck supple.  Cardiovascular:     Rate and Rhythm: Normal rate and regular rhythm.     Heart sounds: No murmur.  Pulmonary:     Effort: Tachypnea and accessory muscle usage present. No respiratory distress.     Breath sounds: Wheezing present.     Comments: He is dyspneic but speaking in full sentences. Abdominal:     Palpations: Abdomen is soft.     Tenderness: There is no abdominal tenderness.  Musculoskeletal: Normal range of motion.     Right lower leg: He exhibits no tenderness. Edema present.     Left lower leg: He exhibits no tenderness. Edema present.  Skin:    General: Skin is warm and dry.     Capillary Refill: Capillary refill takes less than 2 seconds.  Neurological:     General: No focal deficit present.     Mental Status: He is alert and oriented to person, place, and time.      ED Treatments / Results  Labs (all labs ordered are listed, but only abnormal results are displayed) Labs Reviewed  CBC WITH DIFFERENTIAL/PLATELET - Abnormal; Notable for the following components:      Result Value   WBC 19.5 (*)    RBC 3.96 (*)    Hemoglobin 12.3 (*)    HCT 37.6 (*)    Neutro Abs 15.3 (*)    Monocytes  Absolute 1.8 (*)    Abs Immature Granulocytes 0.10 (*)    All other components within normal limits  BASIC METABOLIC PANEL - Abnormal; Notable for the following components:   Glucose, Bld 154 (*)    All other components within normal limits  BLOOD GAS, VENOUS - Abnormal; Notable for the following components:   pH, Ven 7.455 (*)    pCO2, Ven 34.1 (*)    pO2, Ven 61.7 (*)    All other components within normal limits  TROPONIN I    EKG EKG Interpretation  Date/Time:  Saturday Oct 17 2018 18:39:41 EDT Ventricular Rate:  113 PR Interval:    QRS Duration: 99 QT Interval:  331 QTC Calculation: 454 R Axis:   74 Text Interpretation:  Sinus tachycardia Low voltage, extremity leads similar to prior yesterday Confirmed by Meridee Score [phone]) on 10/17/2018 6:46:20 PM   Radiology Dg Chest Port 1 View  Result Date: 10/16/2018 CLINICAL DATA:  Shortness of breath EXAM: PORTABLE CHEST 1 VIEW COMPARISON:  08/01/2018 FINDINGS: The heart size and mediastinal contours are within normal limits. Both lungs are clear. The visualized skeletal structures are unremarkable. IMPRESSION: No active disease. Electronically Signed   By: Deatra Robinson M.D.   On: 10/16/2018 18:12    Procedures Procedures (including critical care time)  Medications Ordered in ED Medications  iohexol (OMNIPAQUE) 350 MG/ML injection 75 mL (75 mLs Intravenous Contrast Given 10/17/18 1955)     Initial Impression / Assessment and Plan / ED Course  I have reviewed the triage vital signs and the nursing notes.  Pertinent labs & imaging results that were available during my care of the patient were reviewed by me and considered in my medical decision making (see chart for details).  Clinical Course as of Oct 18 1214  Sat Oct 17, 2018  1907 Patient just seen here yesterday for shortness of breath here for a syncopal event [facility]  Differential diagnosis includes COPD exacerbation, hypercapnia, PE, arrhythmia,  intracranial hemorrhage   [MB]  1909 Did not order a Covid on the patient as he just had one less 24 hours ago.   [MB]  1922 Patient's lab work started to come back.  His VBG does not show an elevated PCO2.  His white blood cell count is elevated but he received IV steroids yesterday and is on prednisone.   [MB]  1942 Patient is still pending his troponin and BMP.  He is also pending a CT head and CT chest.  I have signed these out to Dr. Clarene DukeMcManus to follow-up on these tests.  If negative I still think the patient is likely to require admission for his dyspnea although yesterday he refused admission.   [MB]    Clinical Course User Index [MB] Terrilee FilesButler, Michael C, MD   Betha LoaDonald P Matura was evaluated in Emergency Department on 10/17/2018 for the symptoms described in the history of present illness. He was evaluated in the context of the global COVID-19 pandemic, which necessitated consideration that the patient might be at risk for infection with the SARS-CoV-2 virus that causes COVID-19. Institutional protocols and algorithms that pertain to the evaluation of patients at risk for COVID-19 are in a state of rapid change based on information released by regulatory bodies including the CDC and federal and state organizations. These policies and algorithms were followed during the patient's care in the ED.      Final Clinical Impressions(s) / ED Diagnoses   Final diagnoses:  COPD exacerbation (HCC)  Syncope, unspecified syncope type  Injury of head, initial encounter    ED Discharge Orders    None       Terrilee FilesButler, Michael C, MD 10/18/18 1217

## 2018-10-20 ENCOUNTER — Encounter: Payer: Self-pay | Admitting: Pulmonary Disease

## 2018-10-20 ENCOUNTER — Ambulatory Visit (INDEPENDENT_AMBULATORY_CARE_PROVIDER_SITE_OTHER): Payer: Medicare Other | Admitting: Pulmonary Disease

## 2018-10-20 ENCOUNTER — Telehealth: Payer: Self-pay

## 2018-10-20 DIAGNOSIS — R55 Syncope and collapse: Secondary | ICD-10-CM | POA: Diagnosis not present

## 2018-10-20 DIAGNOSIS — J441 Chronic obstructive pulmonary disease with (acute) exacerbation: Secondary | ICD-10-CM | POA: Diagnosis not present

## 2018-10-20 DIAGNOSIS — F17200 Nicotine dependence, unspecified, uncomplicated: Secondary | ICD-10-CM | POA: Diagnosis not present

## 2018-10-20 DIAGNOSIS — J449 Chronic obstructive pulmonary disease, unspecified: Secondary | ICD-10-CM

## 2018-10-20 MED ORDER — DOXYCYCLINE HYCLATE 100 MG PO TABS: 100.0000 mg | ORAL_TABLET | Freq: Two times a day (BID) | ORAL | 0 refills | Status: DC

## 2018-10-20 NOTE — Progress Notes (Signed)
PULMONARY OFFICE FOLLOW UP NOTE  Requesting MD/Service: Kizzie Bane, MD Date of initial consultation: 07/22/2018 Reason for consultation: Smoker, dyspnea  PT PROFILE: 48 y.o. male heavy smoker of up to 2 PPD all of his adult life referred for evaluation of severe exertional dyspnea  DATA: 10/17/18 CT chest: No evidence of pulmonary emboli. Mild changes of lower lobe mucous plugging and bronchial wall thickening c/w chronic bronchitis.  INTERVAL: Seen in ED 05/22 and again 05/23 with increased dyspnea and syncope both times. He refused admission. Treated as AECOPD with pulse of prednisone  Virtual Visit via Telephone Note  I connected with Betha Loa on 10/20/18 at  1:30 PM EDT by telephone and verified that I am speaking with the correct person using two identifiers. I discussed the limitations, risks, security and privacy concerns of performing an evaluation and management service by telephone and the availability of in person appointments. I also discussed with the patient that there may be a patient responsible charge related to this service. The patient expressed understanding and agreed to proceed.  SUBJ:  As above. He remains very SOB with little improvement since ED last week. States that he "feels like crap". Still with severe DOE and also reports lightheadedness. Denies CP and fever. Has chronic LE edema.   MEDICATIONS: I have reviewed all medications and confirmed regimen as documented   There were no vitals filed for this visit.   EXAM:  Due to the remote nature of telephone encounter, no physical exam could be performed. Pt was able to speak in full sentences without overt dyspnea noted during the conversation  DATA:   BMP Latest Ref Rng & Units 10/17/2018 10/16/2018 06/06/2018  Glucose 70 - 99 mg/dL 127(N) 170(Y) 174(B)  BUN 6 - 20 mg/dL 11 5(L) 9  Creatinine 4.49 - 1.24 mg/dL 6.75 9.16 3.84  BUN/Creat Ratio 9 - 20 - - -  Sodium 135 - 145 mmol/L 139 136 138   Potassium 3.5 - 5.1 mmol/L 4.2 3.5 3.6  Chloride 98 - 111 mmol/L 106 103 107  CO2 22 - 32 mmol/L 22 22 24   Calcium 8.9 - 10.3 mg/dL 9.2 6.6(Z) 9.2    CBC Latest Ref Rng & Units 10/17/2018 10/16/2018 06/06/2018  WBC 4.0 - 10.5 K/uL 19.5(H) 11.5(H) 8.8  Hemoglobin 13.0 - 17.0 g/dL 12.3(L) 13.5 13.3  Hematocrit 39.0 - 52.0 % 37.6(L) 40.8 40.6  Platelets 150 - 400 K/uL 304 290 278    CXR 05/22: No acute findings  I have personally reviewed all chest radiographs reported above including CXRs and CT chest unless otherwise indicated  IMPRESSION:     ICD-10-CM   1. Smoker F17.200   2. 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 COPD exacerbation (HCC) J44.1   4. Syncope, unspecified syncope type R55     PLAN:  - I instructed that he must abstain from all smoking including second hand smoke - Continue Stiolto as previously prescribed - Continue albuterol inhaler or nebulizer as needed - Continue oxygen therapy with sleep - Complete prednisone as prescribed 05/23 - New prescription: doxycycline 100 mg twice a day for 7 days - Get the 6 minute walk test and PFTs done as scheduled - I will move appointment presently scheduled for 06/30 up to 06/16 at 0930 (30 minute slot)   Manuel Fischer, MD PCCM service Mobile [phone] Pager [phone] 10/20/2018 1:25 PM

## 2018-10-20 NOTE — Patient Instructions (Addendum)
-   You must abstain from all smoking including second hand smoke - Continue Stiolto as previously prescribed - Continue albuterol inhaler or nebulizer as needed - Continue oxygen therapy with sleep - Complete prednisone as prescribed - New prescription: doxycycline 100 mg twice a day for 7 days - Please get the 6 minute walk test and PFTs done as scheduled - I will move your appointment presently scheduled for 06/30 up to 06/16 at 0930 (30 minute slot)

## 2018-10-20 NOTE — Telephone Encounter (Signed)
Spoke to patient's wife who was very upset that we called the patient. She went on about how the patient CANNOT be without oxygen during the day because he has passed out twice last week. She stated that they were told it was either from low O2 or enlarged heart. I again explained to her what I had just told the patient, that he must come in for a qualifying walk before we can prescribe the daytime oxygen and he might be able to get a POC. The wife was VERY rude and stated that either "Dr. Sung Amabile can drive up here to [place] in his [place] and walk him, or one of the nurses, or we can pay for an ambulance to pick him up and bring him in for the walk." I assured the wife that we would not do this as we do not do house calls.   I set them up for a phone visit today as wife was very rude and stated that she MUST get a phone call before 4:00 pm. She is aware that this will be billed to insurance like a normal office visit.

## 2018-10-20 NOTE — Telephone Encounter (Signed)
Spoke with home health nurse Manuel Richard, who was just a patient's house yesterday. She stated that the patient is addiment that he WILL NOT leave his house without oxygen, because he has passed out twice in the last week. Patient is now wearing his oxygen during the day even though it has only been prescribed 2L qHS. Patient is scheduled for 6 min walk 11/02/18 to assess need for daytime O2. Per Manuel Richard, patient has decreased his smoking.   I then called and spoke to the patient who also refused to leave his house without a portable oxygen concentrator. I explained to patient that he must first come in for a qualifying walk in order to be prescribed daytime oxygen. I also explained to the patient that his insurance is not going to cover the increased oxygen use if it has not been prescribed. I assured patient I would check with Dr. Sung Amabile and see if there was anything else we can do. Patient stated that if there isn't, he is going to go to [facility] where "the doctor listens to you and doesn't argue with you."  DS please advise.

## 2018-11-02 ENCOUNTER — Ambulatory Visit: Payer: Medicare Other

## 2018-11-04 ENCOUNTER — Ambulatory Visit: Payer: Medicare Other

## 2018-11-10 ENCOUNTER — Ambulatory Visit: Payer: Medicare Other | Admitting: Pulmonary Disease

## 2018-11-24 ENCOUNTER — Other Ambulatory Visit: Payer: Self-pay | Admitting: Rehabilitative and Restorative Service Providers"

## 2018-11-24 ENCOUNTER — Ambulatory Visit: Payer: Medicare Other | Admitting: Pulmonary Disease

## 2018-11-24 DIAGNOSIS — R0789 Other chest pain: Secondary | ICD-10-CM

## 2018-11-24 DIAGNOSIS — I519 Heart disease, unspecified: Secondary | ICD-10-CM

## 2018-11-24 DIAGNOSIS — I5189 Other ill-defined heart diseases: Secondary | ICD-10-CM

## 2018-11-24 DIAGNOSIS — R55 Syncope and collapse: Secondary | ICD-10-CM

## 2018-11-24 DIAGNOSIS — R0602 Shortness of breath: Secondary | ICD-10-CM

## 2018-11-25 ENCOUNTER — Other Ambulatory Visit: Payer: Self-pay

## 2018-12-03 ENCOUNTER — Telehealth: Payer: Self-pay

## 2018-12-03 NOTE — Telephone Encounter (Signed)
Per Barbara's note in Epic she spoke with patient and advised of PFT appointment date, arrival time and location.   Pt has been R/S to Wed 01/13/2019 at 12:15 at [facility]. Pt to arrive at 12:00.

## 2018-12-03 NOTE — Telephone Encounter (Signed)
Called patient and wife to notify of covid date and time for PFT. They stated no one made them aware this had been rescheduled and he will be unable to keep pft apt for 12/10/18 since he has a stress test 12/09/18.  Will route to [place] as FYI.

## 2018-12-03 NOTE — Telephone Encounter (Signed)
Called patient to notify him of new appointment date and time. No answer.  Left detailed message on VM and also mailed out PFT instruction sheet with date, time, location and arrival time.  Advised patient that if this date or time did not work for him that he could contact us at [phone] or PFT Department at [phone].   Information mailed on 12/04/2018. Nothing else needed at this time. Rhonda J Cobb

## 2018-12-07 ENCOUNTER — Encounter: Admission: RE | Admit: 2018-12-07 | Payer: Medicare Other | Source: Ambulatory Visit

## 2018-12-10 ENCOUNTER — Ambulatory Visit: Payer: Medicare Other

## 2018-12-18 ENCOUNTER — Other Ambulatory Visit: Payer: Self-pay | Admitting: Pulmonary Disease

## 2018-12-18 NOTE — Telephone Encounter (Signed)
Contacted pharmacy re faxed rx request. Pharmacy stated that they are unable to fax the request.They were faxing to the wrong number. Fax verified and they will refax request. No further action necessary at this time.

## 2018-12-21 NOTE — Telephone Encounter (Signed)
Returned call to [facility] who states they did not receive fax on 12/18/2018. Rx request re-faxed to alternate fax number [phone] provided by pharmacy representative.

## 2018-12-25 ENCOUNTER — Other Ambulatory Visit: Payer: Self-pay

## 2019-01-05 ENCOUNTER — Telehealth: Payer: Self-pay | Admitting: Pulmonary Disease

## 2019-01-05 NOTE — Telephone Encounter (Signed)
Left message to relay date/time for covid testing.  01/08/2019 [facility] 12[facility]30[facility]

## 2019-01-06 NOTE — Telephone Encounter (Signed)
Called and spoke to pt.  Pt wished to reschedule PFT.  PFT has been rescheduled to 04/08/2019. Appointment reminder has been mailed to address on file per pt request. Nothing further is needed.

## 2019-01-08 ENCOUNTER — Inpatient Hospital Stay: Admission: RE | Admit: 2019-01-08 | Payer: Medicare Other | Source: Ambulatory Visit

## 2019-01-08 ENCOUNTER — Other Ambulatory Visit: Admission: RE | Admit: 2019-01-08 | Payer: Medicare Other | Source: Ambulatory Visit

## 2019-01-13 ENCOUNTER — Ambulatory Visit: Payer: Medicare Other

## 2019-02-22 ENCOUNTER — Ambulatory Visit: Payer: Medicare Other | Admitting: Pulmonary Disease

## 2019-02-25 ENCOUNTER — Telehealth: Payer: Self-pay | Admitting: Pulmonary Disease

## 2019-02-25 NOTE — Telephone Encounter (Signed)
Left message for pt's spouse, crystal (DPR).

## 2019-02-25 NOTE — Telephone Encounter (Signed)
Called and spoke to [employer], who stated that she was advised by [employer] that pt would have to pay out of pocket for oxygen tank. Upon research, pt is only on night time oxygen. I have explained reasoning as to why [employer] can not provide pt with oxygen tank for during the day until he is qualified for day time oxygen. Crystal voiced her understanding. Nothing further is needed.

## 2019-02-25 NOTE — Telephone Encounter (Signed)
Called and spoke to crystal and advised her to contact [facility] regarding oxygen tanks.  PCP started pt on Levaquin and prednisone this morning, due to increased sob, increased weakness and prod cough with yellowish mucus. Per pt, PCP wanted to make LG aware of this. Pt has pending appt with Dr. Patsey Berthold on 02/26/2019. Will route to Dr.Gonzalez as an Pharmacist, hospital.

## 2019-02-25 NOTE — Telephone Encounter (Signed)
Left message for Crystal to relay below recommendations.

## 2019-02-25 NOTE — Telephone Encounter (Signed)
Unfortunately oxygen cannot be ordered without determination of oxygen saturation.  He would need to do a 6-minute walk.  If he is more acutely short of breath command coming to the emergency room to be evaluated he may need admission for management of exacerbation.

## 2019-02-25 NOTE — Telephone Encounter (Signed)
Spoke to pt's spouse, Crystal and relayed below recommendations.  Pt declined ED multiple times.  Nothing further is needed.

## 2019-02-26 ENCOUNTER — Ambulatory Visit (INDEPENDENT_AMBULATORY_CARE_PROVIDER_SITE_OTHER): Payer: Medicare Other | Admitting: Pulmonary Disease

## 2019-02-26 ENCOUNTER — Encounter: Payer: Self-pay | Admitting: Pulmonary Disease

## 2019-02-26 ENCOUNTER — Ambulatory Visit: Payer: Medicare Other | Admitting: Pulmonary Disease

## 2019-02-26 ENCOUNTER — Other Ambulatory Visit: Payer: Self-pay

## 2019-02-26 DIAGNOSIS — Z5321 Procedure and treatment not carried out due to patient leaving prior to being seen by health care provider: Secondary | ICD-10-CM

## 2019-02-26 NOTE — Patient Instructions (Signed)
Patient left without being seen.

## 2019-02-26 NOTE — Progress Notes (Signed)
Patient ID: Manuel Richard, male   DOB: 27-Jun-1970, 48 y.o.   MRN: [id number]  Patient presented being belligerent and using foul language berating staff.  Refused to wear a mask according to COVID-19 guidelines.  Alternative methods(mask/faceshield.etc) of protection were offered during the visit however, the patient refused and left the office using foul language.  He was verbally abusive to staff.  Security had to be notified due to the patient's belligerent status.  Unfortunately will not be able to provide services for this patient.  Patient left without being seen.   Renold Don, MD  PCCM

## 2019-04-02 ENCOUNTER — Telehealth: Payer: Self-pay | Admitting: Pulmonary Disease

## 2019-04-02 NOTE — Telephone Encounter (Signed)
Pt has recently been dismissed from our practice. He has a pending PFT scheduled for 04/08/2019. Per LG verbally- PFT will need to be canceled.   Rhonda, please advise. Thanks

## 2019-04-02 NOTE — Telephone Encounter (Signed)
Spoke with Pamala Hurry at Cardiopulmonary and canceled PFT per Dr. Patsey Berthold. Rhonda J Cobb

## 2019-04-02 NOTE — Telephone Encounter (Signed)
LMOVM for Cardiopulmonary to return my call. Rhonda J Cobb  

## 2019-04-05 ENCOUNTER — Other Ambulatory Visit: Payer: Medicare Other

## 2019-04-08 ENCOUNTER — Ambulatory Visit: Payer: Medicare Other

## 2019-05-11 ENCOUNTER — Other Ambulatory Visit: Payer: Self-pay | Admitting: Physician Assistant

## 2019-05-11 DIAGNOSIS — I5189 Other ill-defined heart diseases: Secondary | ICD-10-CM

## 2019-05-11 DIAGNOSIS — R55 Syncope and collapse: Secondary | ICD-10-CM

## 2019-05-11 DIAGNOSIS — I519 Heart disease, unspecified: Secondary | ICD-10-CM

## 2019-05-11 DIAGNOSIS — R0602 Shortness of breath: Secondary | ICD-10-CM

## 2019-05-11 DIAGNOSIS — R0789 Other chest pain: Secondary | ICD-10-CM

## 2019-05-14 ENCOUNTER — Encounter (HOSPITAL_COMMUNITY): Payer: Self-pay

## 2019-05-14 ENCOUNTER — Emergency Department (HOSPITAL_COMMUNITY)
Admission: EM | Admit: 2019-05-14 | Discharge: 2019-05-14 | Disposition: A | Discharge status: Home / Self Care | Payer: Medicare Other | Attending: Emergency Medicine | Admitting: Emergency Medicine

## 2019-05-14 ENCOUNTER — Other Ambulatory Visit: Payer: Self-pay

## 2019-05-14 ENCOUNTER — Emergency Department (HOSPITAL_COMMUNITY): Payer: Medicare Other

## 2019-05-14 DIAGNOSIS — R0602 Shortness of breath: Secondary | ICD-10-CM | POA: Diagnosis present

## 2019-05-14 DIAGNOSIS — F17210 Nicotine dependence, cigarettes, uncomplicated: Secondary | ICD-10-CM | POA: Insufficient documentation

## 2019-05-14 DIAGNOSIS — Z79899 Other long term (current) drug therapy: Secondary | ICD-10-CM | POA: Diagnosis not present

## 2019-05-14 DIAGNOSIS — Z20828 Contact with and (suspected) exposure to other viral communicable diseases: Secondary | ICD-10-CM | POA: Diagnosis not present

## 2019-05-14 DIAGNOSIS — J441 Chronic obstructive pulmonary disease with (acute) exacerbation: Secondary | ICD-10-CM | POA: Diagnosis not present

## 2019-05-14 DIAGNOSIS — Z7982 Long term (current) use of aspirin: Secondary | ICD-10-CM | POA: Insufficient documentation

## 2019-05-14 LAB — CBC WITH DIFFERENTIAL/PLATELET
Abs Immature Granulocytes: 0.05 10*3/uL (ref 0.00–0.07)
Basophils Absolute: 0 10*3/uL (ref 0.0–0.1)
Basophils Relative: 0 %
Eosinophils Absolute: 0.4 10*3/uL (ref 0.0–0.5)
Eosinophils Relative: 3 %
HCT: 41.4 % (ref 39.0–52.0)
Hemoglobin: 13.4 g/dL (ref 13.0–17.0)
Immature Granulocytes: 0 %
Lymphocytes Relative: 7 %
Lymphs Abs: 1 10*3/uL (ref 0.7–4.0)
MCH: 31.5 pg (ref 26.0–34.0)
MCHC: 32.4 g/dL (ref 30.0–36.0)
MCV: 97.4 fL (ref 80.0–100.0)
Monocytes Absolute: 0.3 10*3/uL (ref 0.1–1.0)
Monocytes Relative: 2 %
Neutro Abs: 12.4 10*3/uL — ABNORMAL HIGH (ref 1.7–7.7)
Neutrophils Relative %: 88 %
Platelets: 273 10*3/uL (ref 150–400)
RBC: 4.25 MIL/uL (ref 4.22–5.81)
RDW: 13 % (ref 11.5–15.5)
WBC: 14 10*3/uL — ABNORMAL HIGH (ref 4.0–10.5)
nRBC: 0 % (ref 0.0–0.2)

## 2019-05-14 LAB — COMPREHENSIVE METABOLIC PANEL
ALT: 18 U/L (ref 0–44)
AST: 15 U/L (ref 15–41)
Albumin: 4.1 g/dL (ref 3.5–5.0)
Alkaline Phosphatase: 105 U/L (ref 38–126)
Anion gap: 9 (ref 5–15)
BUN: 8 mg/dL (ref 6–20)
CO2: 25 mmol/L (ref 22–32)
Calcium: 9.1 mg/dL (ref 8.9–10.3)
Chloride: 105 mmol/L (ref 98–111)
Creatinine, Ser: 0.7 mg/dL (ref 0.61–1.24)
GFR calc Af Amer: >60 mL/min (ref >60)
GFR calc non Af Amer: >60 mL/min (ref >60)
Glucose, Bld: 111 mg/dL — ABNORMAL HIGH (ref 70–99)
Potassium: 4.1 mmol/L (ref 3.5–5.1)
Sodium: 139 mmol/L (ref 135–145)
Total Bilirubin: 0.7 mg/dL (ref 0.3–1.2)
Total Protein: 7 g/dL (ref 6.5–8.1)

## 2019-05-14 LAB — RESPIRATORY PANEL BY RT PCR (FLU A&B, COVID)
Influenza A by PCR: NEGATIVE
Influenza B by PCR: NEGATIVE
SARS Coronavirus 2 by RT PCR: NEGATIVE

## 2019-05-14 MED ORDER — ALBUTEROL SULFATE HFA 108 (90 BASE) MCG/ACT IN AERS: 6.0000 | INHALATION_SPRAY | Freq: Once | RESPIRATORY_TRACT | Status: DC

## 2019-05-14 MED ORDER — IPRATROPIUM-ALBUTEROL 20-100 MCG/ACT IN AERS
2.0000 | INHALATION_SPRAY | Freq: Once | RESPIRATORY_TRACT | Status: AC
  Administered 2019-05-14: 2 via RESPIRATORY_TRACT
  Filled 2019-05-14: qty 4

## 2019-05-14 MED ORDER — IPRATROPIUM BROMIDE HFA 17 MCG/ACT IN AERS: 2.0000 | INHALATION_SPRAY | Freq: Once | RESPIRATORY_TRACT | Status: DC

## 2019-05-14 MED ORDER — ALBUTEROL SULFATE HFA 108 (90 BASE) MCG/ACT IN AERS
4.0000 | INHALATION_SPRAY | Freq: Once | RESPIRATORY_TRACT | Status: AC
  Administered 2019-05-14: 4 via RESPIRATORY_TRACT
  Filled 2019-05-14: qty 6.7

## 2019-05-14 MED ORDER — PREDNISONE 20 MG PO TABS: ORAL_TABLET | ORAL | 0 refills | Status: DC

## 2019-05-14 NOTE — Discharge Instructions (Addendum)
You were evaluated in the Emergency Department and after careful evaluation, we did not find any emergent condition requiring admission or further testing in the hospital.  Your exam/testing today is overall reassuring.  Your symptoms seem to be due to a flare of your COPD.  Please take the prednisone as directed and use the inhalers as needed.  Please return to the Emergency Department if you experience any worsening of your condition.  We encourage you to follow up with a primary care provider.  Thank you for allowing [place] to be a part of your care.

## 2019-05-14 NOTE — ED Triage Notes (Signed)
Pt brought in by EMS   Has dx COPD and asthma. Pt smokes heavily . At scene pt was wheezing , SOB and given neb. Pt has increased SOB over 4 days. Solumedrol adm and Mag 2mg   Sulfate . After nebs 100% Now on2 L with sats 96 %

## 2019-05-14 NOTE — ED Notes (Signed)
Pt requesting to be discharged. Informed EDP

## 2019-05-14 NOTE — ED Provider Notes (Signed)
[facility] EMERGENCY DEPARTMENT Provider Note   CSN: [id number] Arrival date & time: 05/14/19  1327     History Chief Complaint  Patient presents with  . Shortness of Breath  . Wheezing    Manuel Richard is a 48 y.o. male.  Patient with history of chronic pain, COPD presents with worsening shortness of breath and wheezing.  Patient cigarette smoker.  This feels similar to multiple previous exacerbations.  Patient given neb on route by EMS Solu-Medrol and magnesium and feels improved.  Patient currently on 2 L nasal cannula.  No fever or Covid contact.  Patient denies blood clot risk factors.         Past Medical History:  Diagnosis Date  . Anemia   . Chronic back pain   . Chronic pain syndrome   . Chronic pain syndrome   . Chronic sinusitis   . COPD (chronic obstructive pulmonary disease) (HCC)   . Dyslipidemia   . Dysphagia   . Epididymitis    Chlamydial, Right  . Erectile dysfunction   . Fatigue   . GERD (gastroesophageal reflux disease)   . Groin pain   . High cholesterol   . Hydrocele    bilateral  . Lumbar radiculopathy   . Nicotine addiction   . Rectal bleeding   . Shortness of breath dyspnea   . Stroke (HCC) 2006   x 2     . Testicular pain     Patient Active Problem List   Diagnosis Date Noted  . Anemia 04/20/2018  . Chronic pain syndrome 04/20/2018  . Dyslipidemia 04/20/2018  . Vitamin D deficiency 04/20/2018  . Chronic low back pain (Primary Area of Pain) (Bilateral) (L>R) w/ sciatica (Left) 04/20/2018  . Chronic lower extremity pain (Secondary Area of Pain) (Left) 04/20/2018  . Pharmacologic therapy 04/20/2018  . Disorder of skeletal system 04/20/2018  . Problems influencing health status 04/20/2018  . Long term current use of opiate analgesic 04/20/2018  . 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 Rectal bleeding 03/17/2018  . Dysphagia 03/17/2018  . Chronic low back pain (Midline) w/o sciatica 05/31/2015  . Back pain at L4-L5  level 02/15/2015  . DDD (degenerative disc disease), lumbosacral 02/15/2015  . Lumbar radiculopathy 02/15/2015  . Drug-seeking behavior 01/12/2015  . Bilateral hydrocele 01/12/2015  . Bilateral varicoceles 01/12/2015  . Orchitis and epididymitis 12/16/2014  . Epididymitis 11/29/2014  . Microscopic hematuria 11/29/2014    Past Surgical History:  Procedure Laterality Date  . APPENDECTOMY         Family History  Problem Relation Age of Onset  . Cancer Mother   . Cancer Father   . Colon cancer Neg Hx   . Colon polyps Neg Hx     Social History   Tobacco Use  . Smoking status: Current Every Day Smoker    Packs/day: 1.00    Years: 31.00    Pack years: 31.00    Types: Cigarettes  . Smokeless tobacco: Never Used  Substance Use Topics  . Alcohol use: No    Alcohol/week: 0.0 standard drinks    Comment: no longer  . Drug use: No    Home Medications Prior to Admission medications   Medication Sig Start Date End Date Taking? Authorizing Provider  albuterol (PROVENTIL HFA;VENTOLIN HFA) 108 (90 Base) MCG/ACT inhaler Inhale 2 puffs into the lungs every 6 (six) hours as needed for wheezing or shortness of breath.   Yes [provider]  aspirin EC 81 MG tablet  Take 81 mg by mouth daily.   Yes [provider]  ezetimibe (ZETIA) 10 MG tablet Take 10 mg by mouth at bedtime. 03/17/19  Yes [provider]  furosemide (LASIX) 20 MG tablet Take 20 mg by mouth daily.  12/24/17  Yes [provider]  gabapentin (NEURONTIN) 600 MG tablet Take 600 mg by mouth 2 (two) times daily. 04/11/19  Yes [provider]  HYDROcodone-acetaminophen (NORCO) 7.5-325 MG tablet Take 1 tablet by mouth as directed. 07/17/18  Yes [provider]  ipratropium-albuterol (DUONEB) 0.5-2.5 (3) MG/3ML SOLN Take 3 mLs by nebulization 3 (three) times daily.   Yes [provider]  LINZESS 290 MCG CAPS capsule Take 290 mcg by mouth daily. 05/04/19  Yes [provider]  omeprazole (PRILOSEC) 40 MG capsule Take 40 mg by mouth daily.   Yes [provider]  sildenafil (REVATIO) 20 MG tablet Take 5 tablets by mouth as needed. 02/22/18  Yes [provider]  STIOLTO RESPIMAT 2.5-2.5 MCG/ACT AERS Inhale 2 puffs into the lungs daily. 07/05/18  Yes [provider]  Vitamin D, Ergocalciferol, (DRISDOL) 50000 UNITS CAPS capsule every 7 (seven) days.  12/23/14  Yes [provider]  atorvastatin (LIPITOR) 40 MG tablet Take 40 mg by mouth daily at 6 PM.  08/16/18   [provider]  buPROPion (WELLBUTRIN XL) 150 MG 24 hr tablet Take 150 mg by mouth daily.    [provider]  doxycycline (VIBRA-TABS) 100 MG tablet Take 1 tablet (100 mg total) by mouth 2 (two) times daily. Patient not taking: Reported on 05/14/2019 10/20/18   Merwyn KatosSimonds, David B, MD  predniSONE (STERAPRED UNI-PAK 21 TAB) 10 MG (21) TBPK tablet Take by mouth daily. Take 6 tabs by mouth daily  for 2 days, then 5 tabs for 2 days, then 4 tabs for 2 days, then 3 tabs for 2 days, 2 tabs for 2 days, then 1 tab by mouth daily for 2 days Patient not taking: Reported on 05/14/2019 10/16/18   Terrilee FilesButler, Michael C, MD    Allergies    Patient has no known allergies.  Review of Systems   Review of Systems  Constitutional: Negative for chills and fever.  HENT: Negative for congestion.   Eyes: Negative for visual disturbance.  Respiratory: Positive for cough and shortness of breath.   Cardiovascular: Negative for chest pain and leg swelling.  Gastrointestinal: Negative for abdominal pain and vomiting.  Genitourinary: Negative for dysuria and flank pain.  Musculoskeletal: Negative for back pain, neck pain and neck stiffness.  Skin: Negative for rash.  Neurological: Negative for light-headedness and headaches.    Physical Exam Updated Vital Signs BP 128/79 (BP Location: Left Arm)   Pulse 79   Temp (!) 97.4 F (36.3 C)   Resp (!) 24   Ht 6' (1.829 m)   Wt  113.4 kg   SpO2 97%   BMI 33.91 kg/m   Physical Exam Vitals and nursing note reviewed.  Constitutional:      Appearance: He is well-developed.  HENT:     Head: Normocephalic and atraumatic.  Eyes:     General:        Right eye: No discharge.        Left eye: No discharge.     Conjunctiva/sclera: Conjunctivae normal.  Neck:     Trachea: No tracheal deviation.  Cardiovascular:     Rate and Rhythm: Normal rate and regular rhythm.  Pulmonary:     Effort: Pulmonary effort  is normal.     Breath sounds: Examination of the right-middle field reveals wheezing. Examination of the left-middle field reveals wheezing. Examination of the right-lower field reveals decreased breath sounds and wheezing. Examination of the left-lower field reveals decreased breath sounds and wheezing. Decreased breath sounds and wheezing present.  Abdominal:     General: There is no distension.     Palpations: Abdomen is soft.     Tenderness: There is no abdominal tenderness. There is no guarding.  Musculoskeletal:     Cervical back: Normal range of motion and neck supple.     Right lower leg: No edema.     Left lower leg: No edema.  Skin:    General: Skin is warm.     Findings: No rash.  Neurological:     Mental Status: He is alert and oriented to person, place, and time.     ED Results / Procedures / Treatments   Labs (all labs ordered are listed, but only abnormal results are displayed) Labs Reviewed  CBC WITH DIFFERENTIAL/PLATELET - Abnormal; Notable for the following components:      Result Value   WBC 14.0 (*)    Neutro Abs 12.4 (*)    All other components within normal limits  RESPIRATORY PANEL BY RT PCR (FLU A&B, COVID)  COMPREHENSIVE METABOLIC PANEL    EKG None  Radiology DG Chest Portable 1 View  Result Date: 05/14/2019 CLINICAL DATA:  Shortness of breath increasing over 4 days, history COPD, asthma EXAM: PORTABLE CHEST 1 VIEW COMPARISON:  Portable exam 1406 hours compared to  10/16/2018 FINDINGS: Normal heart size, mediastinal contours, and pulmonary vascularity. Minimal bibasilar atelectasis and central peribronchial thickening. No acute infiltrate, pleural effusion, or pneumothorax. Bones unremarkable. IMPRESSION: Minimal bronchitic changes and bibasilar atelectasis. Electronically Signed   By: Lavonia Dana M.D.   On: 05/14/2019 14:36    Procedures Procedures (including critical care time)  Medications Ordered in ED Medications  Ipratropium-Albuterol (COMBIVENT) respimat 2 puff (has no administration in time range)  albuterol (VENTOLIN HFA) 108 (90 Base) MCG/ACT inhaler 4 puff (4 puffs Inhalation Given 05/14/19 1552)    ED Course  I have reviewed the triage vital signs and the nursing notes.  Pertinent labs & imaging results that were available during my care of the patient were reviewed by me and considered in my medical decision making (see chart for details).    MDM Rules/Calculators/A&P                     Patient presents with clinically COPD exacerbation.  Patient improved with EMS treatment and plan for Covid test, screening blood work, chest x-ray.  Chest x-ray reviewed no acute findings.  Repeat albuterol/Atrovent ordered.  Patient care signed out to follow-up blood work, reassess and patient is hoping to go home with oral prednisone.   Final Clinical Impression(s) / ED Diagnoses Final diagnoses:  Acute exacerbation of chronic obstructive pulmonary disease (COPD) ([place])    Rx / DC Orders ED Discharge Orders    None       Elnora Morrison, MD 05/14/19 [phone]

## 2019-05-14 NOTE — ED Provider Notes (Signed)
  Provider Note MRN:  [id number]  Arrival date & time: 05/14/19    ED Course and Medical Decision Making  Assumed care from Dr. Reather Converse at shift change.  Work-up is reassuring, upon reassessment patient is requesting discharge.  He is speaking in full sentences, no hypoxia, appropriate for discharge with prednisone.  Procedures  Final Clinical Impressions(s) / ED Diagnoses     ICD-10-CM   1. Acute exacerbation of chronic obstructive pulmonary disease (COPD) ([place])  J44.1     ED Discharge Orders         Ordered    predniSONE (DELTASONE) 20 MG tablet     05/14/19 1658            Discharge Instructions     You were evaluated in the Emergency Department and after careful evaluation, we did not find any emergent condition requiring admission or further testing in the hospital.  Your exam/testing today is overall reassuring.  Your symptoms seem to be due to a flare of your COPD.  Please take the prednisone as directed and use the inhalers as needed.  Please return to the Emergency Department if you experience any worsening of your condition.  We encourage you to follow up with a primary care provider.  Thank you for allowing [place] to be a part of your care.    Barth Kirks. Sedonia Small, [place] [email or web address]    Maudie Flakes, MD 05/14/19 [phone]

## 2019-05-14 NOTE — ED Notes (Signed)
Pt refuses to wear a mask stating he has a doctors note

## 2019-05-25 ENCOUNTER — Ambulatory Visit: Payer: Medicare Other

## 2019-06-14 ENCOUNTER — Ambulatory Visit: Admission: RE | Admit: 2019-06-14 | Payer: Medicare Other | Source: Ambulatory Visit

## 2019-06-16 ENCOUNTER — Telehealth: Payer: Self-pay | Admitting: Pulmonary Disease

## 2019-06-16 NOTE — Telephone Encounter (Signed)
Pt would like to know how to complete the walk test he will need to get requalified as he is not able to wear a mask into the office for walk.  Pt's spouse  Asks to be reached at  [phone].

## 2019-06-16 NOTE — Telephone Encounter (Signed)
Pt has been discharged from practice. Please see 02/26/2019 office visit note.  Dismissal letter was mailed, however pt did not sign as the post office noted 'covid 19' on signature line.  Spoke to pt and asked if he had received dismissal letter, and to gather additional information regarding oxygen. Pt deferred call to his spouse, Manuel Richard.  Manuel Richard was unsure if letter was received in October of 2020. Pt was in the background questioning why he was dismissed, as I was explaining reasoning to Manuel Richard pt was shouting in the background that he would be hiring a Clinical research associate and suing our office. Pt demanded that Manuel Richard end the call with me. Before Manuel Richard ended the call, she stated that she would contact pt's PCP and request another pulmonologist. I have spoken to Dr. Jayme Cloud regarding this matter, who stated that she is not comfortable seeing patient due to previous encounter and pt refusing to wear a mask.  Pt is currently scheduled for phone visit with Dr. Jayme Cloud for 06/25/2019, in appt notes it states that pt is unable to wear a mask. Appt has been canceled due to dismissal.

## 2019-06-17 NOTE — Telephone Encounter (Signed)
As noted.  Post office delivered letter, signature was not documented due to COVID-19 restrictions of sharing electronic screen and signature stylus.  However the post person has to confirm delivery to the recipient prior to documenting "COVID-19".  The patient was belligerent on a prior visit here which led to dismissal, he was abusive.  I fear for the safety of my staff and other patients and this was conveyed to him at the time of the visit in October.  This was documented on that visit date.

## 2019-06-25 ENCOUNTER — Ambulatory Visit: Payer: Medicare Other | Admitting: Pulmonary Disease

## 2019-07-09 ENCOUNTER — Other Ambulatory Visit: Payer: Self-pay

## 2019-07-09 ENCOUNTER — Emergency Department (HOSPITAL_COMMUNITY): Payer: Medicare Other

## 2019-07-09 ENCOUNTER — Encounter (HOSPITAL_COMMUNITY): Payer: Self-pay | Admitting: Emergency Medicine

## 2019-07-09 ENCOUNTER — Emergency Department (HOSPITAL_COMMUNITY)
Admission: EM | Admit: 2019-07-09 | Discharge: 2019-07-09 | Disposition: A | Discharge status: Home / Self Care | Payer: Medicare Other | Attending: Emergency Medicine | Admitting: Emergency Medicine

## 2019-07-09 DIAGNOSIS — R0602 Shortness of breath: Secondary | ICD-10-CM | POA: Diagnosis present

## 2019-07-09 DIAGNOSIS — Z79899 Other long term (current) drug therapy: Secondary | ICD-10-CM | POA: Diagnosis not present

## 2019-07-09 DIAGNOSIS — Z7982 Long term (current) use of aspirin: Secondary | ICD-10-CM | POA: Diagnosis not present

## 2019-07-09 DIAGNOSIS — F17210 Nicotine dependence, cigarettes, uncomplicated: Secondary | ICD-10-CM | POA: Insufficient documentation

## 2019-07-09 DIAGNOSIS — J441 Chronic obstructive pulmonary disease with (acute) exacerbation: Secondary | ICD-10-CM | POA: Diagnosis not present

## 2019-07-09 DIAGNOSIS — Z20822 Contact with and (suspected) exposure to covid-19: Secondary | ICD-10-CM | POA: Diagnosis not present

## 2019-07-09 LAB — TROPONIN I (HIGH SENSITIVITY): Troponin I (High Sensitivity): 4 ng/L (ref <18)

## 2019-07-09 LAB — COMPREHENSIVE METABOLIC PANEL
ALT: 29 U/L (ref 0–44)
AST: 20 U/L (ref 15–41)
Albumin: 3.9 g/dL (ref 3.5–5.0)
Alkaline Phosphatase: 91 U/L (ref 38–126)
Anion gap: 10 (ref 5–15)
BUN: 10 mg/dL (ref 6–20)
CO2: 21 mmol/L — ABNORMAL LOW (ref 22–32)
Calcium: 9.1 mg/dL (ref 8.9–10.3)
Chloride: 108 mmol/L (ref 98–111)
Creatinine, Ser: 1.04 mg/dL (ref 0.61–1.24)
GFR calc Af Amer: >60 mL/min (ref >60)
GFR calc non Af Amer: >60 mL/min (ref >60)
Glucose, Bld: 138 mg/dL — ABNORMAL HIGH (ref 70–99)
Potassium: 3 mmol/L — ABNORMAL LOW (ref 3.5–5.1)
Sodium: 139 mmol/L (ref 135–145)
Total Bilirubin: 0.5 mg/dL (ref 0.3–1.2)
Total Protein: 7 g/dL (ref 6.5–8.1)

## 2019-07-09 LAB — CBC WITH DIFFERENTIAL/PLATELET
Abs Immature Granulocytes: 0.03 10*3/uL (ref 0.00–0.07)
Basophils Absolute: 0.1 10*3/uL (ref 0.0–0.1)
Basophils Relative: 1 %
Eosinophils Absolute: 0.8 10*3/uL — ABNORMAL HIGH (ref 0.0–0.5)
Eosinophils Relative: 8 %
HCT: 40.6 % (ref 39.0–52.0)
Hemoglobin: 13.7 g/dL (ref 13.0–17.0)
Immature Granulocytes: 0 %
Lymphocytes Relative: 34 %
Lymphs Abs: 3.1 10*3/uL (ref 0.7–4.0)
MCH: 31.6 pg (ref 26.0–34.0)
MCHC: 33.7 g/dL (ref 30.0–36.0)
MCV: 93.8 fL (ref 80.0–100.0)
Monocytes Absolute: 0.9 10*3/uL (ref 0.1–1.0)
Monocytes Relative: 10 %
Neutro Abs: 4.3 10*3/uL (ref 1.7–7.7)
Neutrophils Relative %: 47 %
Platelets: 290 10*3/uL (ref 150–400)
RBC: 4.33 MIL/uL (ref 4.22–5.81)
RDW: 12.3 % (ref 11.5–15.5)
WBC: 9.2 10*3/uL (ref 4.0–10.5)
nRBC: 0 % (ref 0.0–0.2)

## 2019-07-09 LAB — RESPIRATORY PANEL BY RT PCR (FLU A&B, COVID)
Influenza A by PCR: NEGATIVE
Influenza B by PCR: NEGATIVE
SARS Coronavirus 2 by RT PCR: NEGATIVE

## 2019-07-09 LAB — BRAIN NATRIURETIC PEPTIDE: B Natriuretic Peptide: 13 pg/mL (ref 0.0–100.0)

## 2019-07-09 MED ORDER — POTASSIUM CHLORIDE CRYS ER 20 MEQ PO TBCR
40.0000 meq | EXTENDED_RELEASE_TABLET | Freq: Once | ORAL | Status: AC
  Administered 2019-07-09: 40 meq via ORAL
  Filled 2019-07-09: qty 2

## 2019-07-09 MED ORDER — PREDNISONE 10 MG PO TABS: 20.0000 mg | ORAL_TABLET | Freq: Every day | ORAL | 0 refills | Status: DC

## 2019-07-09 MED ORDER — HYDROCODONE-ACETAMINOPHEN 5-325 MG PO TABS
1.0000 | ORAL_TABLET | Freq: Once | ORAL | Status: AC
  Administered 2019-07-09: 1 via ORAL
  Filled 2019-07-09: qty 1

## 2019-07-09 MED ORDER — PREDNISONE 10 MG PO TABS
10.0000 mg | ORAL_TABLET | Freq: Once | ORAL | Status: AC
  Administered 2019-07-09: 10 mg via ORAL
  Filled 2019-07-09: qty 1

## 2019-07-09 MED ORDER — MAGNESIUM SULFATE 2 GM/50ML IV SOLN
2.0000 g | Freq: Once | INTRAVENOUS | Status: DC
  Filled 2019-07-09: qty 50

## 2019-07-09 MED ORDER — METHYLPREDNISOLONE SODIUM SUCC 125 MG IJ SOLR
125.0000 mg | Freq: Once | INTRAMUSCULAR | Status: AC
  Administered 2019-07-09: 125 mg via INTRAMUSCULAR

## 2019-07-09 MED ORDER — ALBUTEROL SULFATE HFA 108 (90 BASE) MCG/ACT IN AERS
4.0000 | INHALATION_SPRAY | Freq: Once | RESPIRATORY_TRACT | Status: AC
  Administered 2019-07-09: 4 via RESPIRATORY_TRACT
  Filled 2019-07-09: qty 6.7

## 2019-07-09 MED ORDER — METHYLPREDNISOLONE SODIUM SUCC 125 MG IJ SOLR
125.0000 mg | Freq: Once | INTRAMUSCULAR | Status: DC
  Filled 2019-07-09: qty 2

## 2019-07-09 NOTE — ED Notes (Signed)
Patient asking for something for pain and something to drink. Gave patient cup of water at this time. Patient states that he needs another shot for his breathing.

## 2019-07-09 NOTE — Discharge Instructions (Addendum)
Follow up with your md next week. °

## 2019-07-09 NOTE — ED Notes (Signed)
EKG done and seen by Dr Estell Harpin. Patient on 12 lead

## 2019-07-09 NOTE — ED Notes (Signed)
Patient given inhaler to take home.  

## 2019-07-09 NOTE — Progress Notes (Signed)
Mdi given by nurse

## 2019-07-09 NOTE — ED Triage Notes (Signed)
Patient brought in by EMS for shortness of breath. Patient has a hx of COPD. Patient has some lower wheezing but is able to speak in complete sentences. EMS place patient on 3 liters of oxygen by nasal cannula. Sats are 98 percent. Patient has home oxygen but rarely uses it according to the patient. Patient did take a nebulizer treatment at home earlier.

## 2019-07-11 NOTE — ED Provider Notes (Signed)
[facility] EMERGENCY DEPARTMENT Provider Note   CSN: [id number] Arrival date & time: 07/09/19  1924     History Chief Complaint  Patient presents with  . Shortness of Breath    Manuel Richard is a 49 y.o. male.  Patient complains of shortness of breath.  Patient has COPD and is on oxygen at home  The history is provided by the patient. No language interpreter was used.  Shortness of Breath Severity:  Moderate Onset quality:  Sudden Timing:  Constant Progression:  Worsening Chronicity:  Recurrent Context: activity   Relieved by:  Nothing Worsened by:  Nothing Associated symptoms: no abdominal pain, no chest pain, no cough, no headaches and no rash        Past Medical History:  Diagnosis Date  . Anemia   . Chronic back pain   . Chronic pain syndrome   . Chronic pain syndrome   . Chronic sinusitis   . COPD (chronic obstructive pulmonary disease) (HCC)   . Dyslipidemia   . Dysphagia   . Epididymitis    Chlamydial, Right  . Erectile dysfunction   . Fatigue   . GERD (gastroesophageal reflux disease)   . Groin pain   . High cholesterol   . Hydrocele    bilateral  . Lumbar radiculopathy   . Nicotine addiction   . Rectal bleeding   . Shortness of breath dyspnea   . Stroke (HCC) 2006   x 2     . Testicular pain     Patient Active Problem List   Diagnosis Date Noted  . Anemia 04/20/2018  . Chronic pain syndrome 04/20/2018  . Dyslipidemia 04/20/2018  . Vitamin D deficiency 04/20/2018  . Chronic low back pain (Primary Area of Pain) (Bilateral) (L>R) w/ sciatica (Left) 04/20/2018  . Chronic lower extremity pain (Secondary Area of Pain) (Left) 04/20/2018  . Pharmacologic therapy 04/20/2018  . Disorder of skeletal system 04/20/2018  . Problems influencing health status 04/20/2018  . Long term current use of opiate analgesic 04/20/2018  . 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 Rectal bleeding 03/17/2018  . Dysphagia 03/17/2018  . Chronic low  back pain (Midline) w/o sciatica 05/31/2015  . Back pain at L4-L5 level 02/15/2015  . DDD (degenerative disc disease), lumbosacral 02/15/2015  . Lumbar radiculopathy 02/15/2015  . Drug-seeking behavior 01/12/2015  . Bilateral hydrocele 01/12/2015  . Bilateral varicoceles 01/12/2015  . Orchitis and epididymitis 12/16/2014  . Epididymitis 11/29/2014  . Microscopic hematuria 11/29/2014    Past Surgical History:  Procedure Laterality Date  . APPENDECTOMY         Family History  Problem Relation Age of Onset  . Cancer Mother   . Cancer Father   . Colon cancer Neg Hx   . Colon polyps Neg Hx     Social History   Tobacco Use  . Smoking status: Current Every Day Smoker    Packs/day: 1.00    Years: 31.00    Pack years: 31.00    Types: Cigarettes  . Smokeless tobacco: Never Used  Substance Use Topics  . Alcohol use: No    Alcohol/week: 0.0 standard drinks    Comment: no longer  . Drug use: No    Home Medications Prior to Admission medications   Medication Sig Start Date End Date Taking? Authorizing Provider  albuterol (PROVENTIL HFA;VENTOLIN HFA) 108 (90 Base) MCG/ACT inhaler Inhale 2 puffs into the lungs every 6 (six) hours as needed for wheezing or shortness of breath.  Yes [provider]  aspirin EC 81 MG tablet Take 81 mg by mouth daily.   Yes [provider]  atorvastatin (LIPITOR) 40 MG tablet Take 40 mg by mouth daily at 6 PM.  08/16/18  Yes [provider]  cyclobenzaprine (FLEXERIL) 10 MG tablet Take 1 tablet by mouth every 12 (twelve) hours. 06/15/19  Yes [provider]  ezetimibe (ZETIA) 10 MG tablet Take 10 mg by mouth at bedtime. 03/17/19  Yes [provider]  furosemide (LASIX) 20 MG tablet Take 20 mg by mouth daily.  12/24/17  Yes [provider]  gabapentin (NEURONTIN) 600 MG tablet Take 600 mg by mouth 2 (two) times daily. 04/11/19  Yes [provider]  HYDROcodone-acetaminophen (NORCO) 10-325 MG  tablet Take 1 tablet by mouth every 4 (four) hours as needed for pain. 07/02/19  Yes [provider]  ipratropium-albuterol (DUONEB) 0.5-2.5 (3) MG/3ML SOLN Take 3 mLs by nebulization 3 (three) times daily.   Yes [provider]  LINZESS 290 MCG CAPS capsule Take 290 mcg by mouth daily. 05/04/19  Yes [provider]  omeprazole (PRILOSEC) 40 MG capsule Take 40 mg by mouth daily.   Yes [provider]  sildenafil (REVATIO) 20 MG tablet Take 5 tablets by mouth as needed. 02/22/18  Yes [provider]  STIOLTO RESPIMAT 2.5-2.5 MCG/ACT AERS Inhale 2 puffs into the lungs daily. 07/05/18  Yes [provider]  Vitamin D, Ergocalciferol, (DRISDOL) 50000 UNITS CAPS capsule every 7 (seven) days.  12/23/14  Yes [provider]  buPROPion (WELLBUTRIN XL) 150 MG 24 hr tablet Take 150 mg by mouth daily.    [provider]  doxycycline (VIBRA-TABS) 100 MG tablet Take 1 tablet (100 mg total) by mouth 2 (two) times daily. Patient not taking: Reported on 05/14/2019 10/20/18   Merwyn Katos, MD  predniSONE (DELTASONE) 10 MG tablet Take 2 tablets (20 mg total) by mouth daily. 07/09/19   Bethann Berkshire, MD    Allergies    Patient has no known allergies.  Review of Systems   Review of Systems  Constitutional: Negative for appetite change and fatigue.  HENT: Negative for congestion, ear discharge and sinus pressure.   Eyes: Negative for discharge.  Respiratory: Positive for shortness of breath. Negative for cough.   Cardiovascular: Negative for chest pain.  Gastrointestinal: Negative for abdominal pain and diarrhea.  Genitourinary: Negative for frequency and hematuria.  Musculoskeletal: Negative for back pain.  Skin: Negative for rash.  Neurological: Negative for seizures and headaches.  Psychiatric/Behavioral: Negative for hallucinations.    Physical Exam Updated Vital Signs BP (!) 112/100   Pulse 100   Temp 98.1 F (36.7 C) (Oral)    Resp (!) 23   Ht 6\' 5"  (1.956 m)   Wt 113.4 kg   SpO2 98%   BMI 29.65 kg/m   Physical Exam Vitals and nursing note reviewed.  Constitutional:      Appearance: He is well-developed.  HENT:     Head: Normocephalic.     Nose: Nose normal.  Eyes:     General: No scleral icterus.    Conjunctiva/sclera: Conjunctivae normal.  Neck:     Thyroid: No thyromegaly.  Cardiovascular:     Rate and Rhythm: Normal rate and regular rhythm.     Heart sounds: No murmur. No friction rub. No gallop.   Pulmonary:     Breath sounds: No stridor. Wheezing present. No rales.  Chest:     Chest wall: No  tenderness.  Abdominal:     General: There is no distension.     Tenderness: There is no abdominal tenderness. There is no rebound.  Musculoskeletal:        General: Normal range of motion.     Cervical back: Neck supple.  Lymphadenopathy:     Cervical: No cervical adenopathy.  Skin:    Findings: No erythema or rash.  Neurological:     Mental Status: He is alert and oriented to person, place, and time.     Motor: No abnormal muscle tone.     Coordination: Coordination normal.  Psychiatric:        Behavior: Behavior normal.     ED Results / Procedures / Treatments   Labs (all labs ordered are listed, but only abnormal results are displayed) Labs Reviewed  CBC WITH DIFFERENTIAL/PLATELET - Abnormal; Notable for the following components:      Result Value   Eosinophils Absolute 0.8 (*)    All other components within normal limits  COMPREHENSIVE METABOLIC PANEL - Abnormal; Notable for the following components:   Potassium 3.0 (*)    CO2 21 (*)    Glucose, Bld 138 (*)    All other components within normal limits  RESPIRATORY PANEL BY RT PCR (FLU A&B, COVID)  BRAIN NATRIURETIC PEPTIDE  TROPONIN I (HIGH SENSITIVITY)    EKG EKG Interpretation  Date/Time:  Friday July 09 2019 19:37:22 EST Ventricular Rate:  112 PR Interval:    QRS Duration: 106 QT Interval:  339 QTC  Calculation: 463 R Axis:   79 Text Interpretation: Sinus tachycardia Baseline wander in lead(s) V3 Confirmed by Milton Ferguson [id number]) on 07/09/2019 9:08:46 PM   Radiology DG Chest Portable 1 View  Result Date: 07/09/2019 CLINICAL DATA:  Shortness of breath EXAM: PORTABLE CHEST 1 VIEW COMPARISON:  05/14/2019 FINDINGS: Linear subsegmental atelectasis at the left base. Right lung clear. Heart is normal size. No effusions or acute bony abnormality. IMPRESSION: Left base atelectasis. Electronically Signed   By: Rolm Baptise M.D.   On: 07/09/2019 20:13    Procedures Procedures (including critical care time)  Medications Ordered in ED Medications  albuterol (VENTOLIN HFA) 108 (90 Base) MCG/ACT inhaler 4 puff (4 puffs Inhalation Given 07/09/19 1953)  methylPREDNISolone sodium succinate (SOLU-MEDROL) 125 mg/2 mL injection 125 mg (125 mg Intramuscular Given 07/09/19 2010)  HYDROcodone-acetaminophen (NORCO/VICODIN) 5-325 MG per tablet 1 tablet (1 tablet Oral Given 07/09/19 2202)  potassium chloride SA (KLOR-CON) CR tablet 40 mEq (40 mEq Oral Given 07/09/19 2255)  predniSONE (DELTASONE) tablet 10 mg (10 mg Oral Given 07/09/19 2254)    ED Course  I have reviewed the triage vital signs and the nursing notes.  Pertinent labs & imaging results that were available during my care of the patient were reviewed by me and considered in my medical decision making (see chart for details).    MDM Rules/Calculators/A&P                      Patient with exacerbation of COPD.  Patient improved with neb treatments and steroid Final Clinical Impression(s) / ED Diagnoses Final diagnoses:  COPD exacerbation ([place])    Rx / DC Orders ED Discharge Orders         Ordered    predniSONE (DELTASONE) 10 MG tablet  Daily     07/09/19 2249           Milton Ferguson, MD 07/11/19 [phone]

## 2019-07-18 ENCOUNTER — Encounter (HOSPITAL_COMMUNITY): Payer: Self-pay | Admitting: Emergency Medicine

## 2019-07-18 ENCOUNTER — Emergency Department (HOSPITAL_COMMUNITY)
Admission: EM | Admit: 2019-07-18 | Discharge: 2019-07-18 | Disposition: A | Discharge status: Home / Self Care | Payer: Medicare Other | Attending: Emergency Medicine | Admitting: Emergency Medicine

## 2019-07-18 ENCOUNTER — Emergency Department (HOSPITAL_COMMUNITY): Payer: Medicare Other

## 2019-07-18 ENCOUNTER — Other Ambulatory Visit: Payer: Self-pay

## 2019-07-18 DIAGNOSIS — R0602 Shortness of breath: Secondary | ICD-10-CM | POA: Diagnosis present

## 2019-07-18 DIAGNOSIS — F17210 Nicotine dependence, cigarettes, uncomplicated: Secondary | ICD-10-CM | POA: Insufficient documentation

## 2019-07-18 DIAGNOSIS — J441 Chronic obstructive pulmonary disease with (acute) exacerbation: Secondary | ICD-10-CM | POA: Diagnosis not present

## 2019-07-18 DIAGNOSIS — Z79899 Other long term (current) drug therapy: Secondary | ICD-10-CM | POA: Diagnosis not present

## 2019-07-18 DIAGNOSIS — Z20822 Contact with and (suspected) exposure to covid-19: Secondary | ICD-10-CM | POA: Insufficient documentation

## 2019-07-18 DIAGNOSIS — Z7982 Long term (current) use of aspirin: Secondary | ICD-10-CM | POA: Diagnosis not present

## 2019-07-18 LAB — COMPREHENSIVE METABOLIC PANEL
ALT: 43 U/L (ref 0–44)
AST: 18 U/L (ref 15–41)
Albumin: 3.9 g/dL (ref 3.5–5.0)
Alkaline Phosphatase: 90 U/L (ref 38–126)
Anion gap: 11 (ref 5–15)
BUN: 8 mg/dL (ref 6–20)
CO2: 24 mmol/L (ref 22–32)
Calcium: 9.1 mg/dL (ref 8.9–10.3)
Chloride: 104 mmol/L (ref 98–111)
Creatinine, Ser: 0.75 mg/dL (ref 0.61–1.24)
GFR calc Af Amer: >60 mL/min (ref >60)
GFR calc non Af Amer: >60 mL/min (ref >60)
Glucose, Bld: 128 mg/dL — ABNORMAL HIGH (ref 70–99)
Potassium: 3.3 mmol/L — ABNORMAL LOW (ref 3.5–5.1)
Sodium: 139 mmol/L (ref 135–145)
Total Bilirubin: 0.5 mg/dL (ref 0.3–1.2)
Total Protein: 6.8 g/dL (ref 6.5–8.1)

## 2019-07-18 LAB — CBC WITH DIFFERENTIAL/PLATELET
Abs Immature Granulocytes: 0.11 10*3/uL — ABNORMAL HIGH (ref 0.00–0.07)
Basophils Absolute: 0.1 10*3/uL (ref 0.0–0.1)
Basophils Relative: 0 %
Eosinophils Absolute: 1 10*3/uL — ABNORMAL HIGH (ref 0.0–0.5)
Eosinophils Relative: 7 %
HCT: 41.2 % (ref 39.0–52.0)
Hemoglobin: 13.5 g/dL (ref 13.0–17.0)
Immature Granulocytes: 1 %
Lymphocytes Relative: 20 %
Lymphs Abs: 2.9 10*3/uL (ref 0.7–4.0)
MCH: 30.9 pg (ref 26.0–34.0)
MCHC: 32.8 g/dL (ref 30.0–36.0)
MCV: 94.3 fL (ref 80.0–100.0)
Monocytes Absolute: 1.1 10*3/uL — ABNORMAL HIGH (ref 0.1–1.0)
Monocytes Relative: 8 %
Neutro Abs: 9.5 10*3/uL — ABNORMAL HIGH (ref 1.7–7.7)
Neutrophils Relative %: 64 %
Platelets: 298 10*3/uL (ref 150–400)
RBC: 4.37 MIL/uL (ref 4.22–5.81)
RDW: 12.6 % (ref 11.5–15.5)
WBC: 14.7 10*3/uL — ABNORMAL HIGH (ref 4.0–10.5)
nRBC: 0 % (ref 0.0–0.2)

## 2019-07-18 LAB — RESPIRATORY PANEL BY RT PCR (FLU A&B, COVID)
Influenza A by PCR: NEGATIVE
Influenza B by PCR: NEGATIVE
SARS Coronavirus 2 by RT PCR: NEGATIVE

## 2019-07-18 LAB — TROPONIN I (HIGH SENSITIVITY): Troponin I (High Sensitivity): 4 ng/L (ref <18)

## 2019-07-18 MED ORDER — PREDNISONE 20 MG PO TABS: 20.0000 mg | ORAL_TABLET | Freq: Two times a day (BID) | ORAL | 1 refills | Status: DC

## 2019-07-18 MED ORDER — ALBUTEROL SULFATE HFA 108 (90 BASE) MCG/ACT IN AERS
2.0000 | INHALATION_SPRAY | RESPIRATORY_TRACT | Status: DC
  Administered 2019-07-18: 2 via RESPIRATORY_TRACT

## 2019-07-18 MED ORDER — PREDNISONE 20 MG PO TABS
20.0000 mg | ORAL_TABLET | Freq: Once | ORAL | Status: AC
  Administered 2019-07-18: 23:00:00 20 mg via ORAL
  Filled 2019-07-18: qty 1

## 2019-07-18 MED ORDER — PREDNISONE 50 MG PO TABS
60.0000 mg | ORAL_TABLET | Freq: Once | ORAL | Status: AC
  Administered 2019-07-18: 60 mg via ORAL
  Filled 2019-07-18: qty 1

## 2019-07-18 NOTE — ED Provider Notes (Addendum)
[facility] EMERGENCY DEPARTMENT Provider Note   CSN: [id number] Arrival date & time: 07/18/19  2152     History Chief Complaint  Patient presents with  . Shortness of Breath    Manuel Richard is a 49 y.o. male.  Level 5 caveat for urgency of condition.  Chief complaint dyspnea.  Patient arrived via EMS with severe dyspnea.  He was given Solu-Medrol 125 mg IV in route.  Known history of significant COPD.  No other history available at this time.        Past Medical History:  Diagnosis Date  . Anemia   . Chronic back pain   . Chronic pain syndrome   . Chronic pain syndrome   . Chronic sinusitis   . COPD (chronic obstructive pulmonary disease) ([place])   . Dyslipidemia   . Dysphagia   . Epididymitis    Chlamydial, Right  . Erectile dysfunction   . Fatigue   . GERD (gastroesophageal reflux disease)   . Groin pain   . High cholesterol   . Hydrocele    bilateral  . Lumbar radiculopathy   . Nicotine addiction   . Rectal bleeding   . Shortness of breath dyspnea   . Stroke ([place]) 2006   x 2     . Testicular pain     Patient Active Problem List   Diagnosis Date Noted  . Anemia 04/20/2018  . Chronic pain syndrome 04/20/2018  . Dyslipidemia 04/20/2018  . Vitamin D deficiency 04/20/2018  . Chronic low back pain (Primary Area of Pain) (Bilateral) (L>R) w/ sciatica (Left) 04/20/2018  . Chronic lower extremity pain (Secondary Area of Pain) (Left) 04/20/2018  . Pharmacologic therapy 04/20/2018  . Disorder of skeletal system 04/20/2018  . Problems influencing health status 04/20/2018  . Long term current use of opiate analgesic 04/20/2018  . 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 Rectal bleeding 03/17/2018  . Dysphagia 03/17/2018  . Chronic low back pain (Midline) w/o sciatica 05/31/2015  . Back pain at L4-L5 level 02/15/2015  . DDD (degenerative disc disease), lumbosacral 02/15/2015  . Lumbar radiculopathy 02/15/2015  . Drug-seeking behavior 01/12/2015    . Bilateral hydrocele 01/12/2015  . Bilateral varicoceles 01/12/2015  . Orchitis and epididymitis 12/16/2014  . Epididymitis 11/29/2014  . Microscopic hematuria 11/29/2014    Past Surgical History:  Procedure Laterality Date  . APPENDECTOMY         Family History  Problem Relation Age of Onset  . Cancer Mother   . Cancer Father   . Colon cancer Neg Hx   . Colon polyps Neg Hx     Social History   Tobacco Use  . Smoking status: Current Every Day Smoker    Packs/day: 1.00    Years: 31.00    Pack years: 31.00    Types: Cigarettes  . Smokeless tobacco: Never Used  Substance Use Topics  . Alcohol use: No    Alcohol/week: 0.0 standard drinks    Comment: no longer  . Drug use: No    Home Medications Prior to Admission medications   Medication Sig Start Date End Date Taking? Authorizing Provider  albuterol (PROVENTIL HFA;VENTOLIN HFA) 108 (90 Base) MCG/ACT inhaler Inhale 2 puffs into the lungs every 6 (six) hours as needed for wheezing or shortness of breath.    [provider]  aspirin EC 81 MG tablet Take 81 mg by mouth daily.    [provider]  atorvastatin (LIPITOR) 40 MG tablet Take 40 mg by  mouth daily at 6 PM.  08/16/18   [provider]  buPROPion (WELLBUTRIN XL) 150 MG 24 hr tablet Take 150 mg by mouth daily.    [provider]  cyclobenzaprine (FLEXERIL) 10 MG tablet Take 1 tablet by mouth every 12 (twelve) hours. 06/15/19   [provider]  doxycycline (VIBRA-TABS) 100 MG tablet Take 1 tablet (100 mg total) by mouth 2 (two) times daily. Patient not taking: Reported on 05/14/2019 10/20/18   Merwyn Katos, MD  ezetimibe (ZETIA) 10 MG tablet Take 10 mg by mouth at bedtime. 03/17/19   [provider]  furosemide (LASIX) 20 MG tablet Take 20 mg by mouth daily.  12/24/17   [provider]  gabapentin (NEURONTIN) 600 MG tablet Take 600 mg by mouth 2 (two) times daily. 04/11/19   [provider]   HYDROcodone-acetaminophen (NORCO) 10-325 MG tablet Take 1 tablet by mouth every 4 (four) hours as needed for pain. 07/02/19   [provider]  ipratropium-albuterol (DUONEB) 0.5-2.5 (3) MG/3ML SOLN Take 3 mLs by nebulization 3 (three) times daily.    [provider]  LINZESS 290 MCG CAPS capsule Take 290 mcg by mouth daily. 05/04/19   [provider]  omeprazole (PRILOSEC) 40 MG capsule Take 40 mg by mouth daily.    [provider]  predniSONE (DELTASONE) 20 MG tablet Take 1 tablet (20 mg total) by mouth 2 (two) times daily with a meal. 07/18/19   Donnetta Hutching, MD  sildenafil (REVATIO) 20 MG tablet Take 5 tablets by mouth as needed. 02/22/18   [provider]  STIOLTO RESPIMAT 2.5-2.5 MCG/ACT AERS Inhale 2 puffs into the lungs daily. 07/05/18   [provider]  Vitamin D, Ergocalciferol, (DRISDOL) 50000 UNITS CAPS capsule every 7 (seven) days.  12/23/14   [provider]    Allergies    Patient has no known allergies.  Review of Systems   Review of Systems  Unable to perform ROS: Acuity of condition    Physical Exam Updated Vital Signs BP 122/81 (BP Location: Right Arm)   Pulse (!) 131   Temp 98.6 F (37 C) (Oral)   Resp (!) 22   Ht 6' (1.829 m)   Wt 113.4 kg   SpO2 96%   BMI 33.91 kg/m   Physical Exam Vitals and nursing note reviewed.  Constitutional:      Comments: Dyspneic, diaphoretic  HENT:     Head: Normocephalic and atraumatic.  Eyes:     Conjunctiva/sclera: Conjunctivae normal.  Cardiovascular:     Rate and Rhythm: Normal rate and regular rhythm.  Pulmonary:     Comments: Tachypneic and dyspneic; not moving air well. Abdominal:     General: Bowel sounds are normal.     Palpations: Abdomen is soft.  Musculoskeletal:        General: Normal range of motion.     Cervical back: Neck supple.  Skin:    General: Skin is warm and dry.  Neurological:     General: No focal deficit present.     Mental Status: He  is alert and oriented to person, place, and time.  Psychiatric:        Behavior: Behavior normal.     ED Results / Procedures / Treatments   Labs (all labs ordered are listed, but only abnormal results are displayed) Labs Reviewed  CBC WITH DIFFERENTIAL/PLATELET - Abnormal; Notable for the following components:      Result Value   WBC 14.7 (*)  Neutro Abs 9.5 (*)    Monocytes Absolute 1.1 (*)    Eosinophils Absolute 1.0 (*)    Abs Immature Granulocytes 0.11 (*)    All other components within normal limits  RESPIRATORY PANEL BY RT PCR (FLU A&B, COVID)  COMPREHENSIVE METABOLIC PANEL  TROPONIN I (HIGH SENSITIVITY)    EKG None  Radiology DG Chest Portable 1 View  Result Date: 07/18/2019 CLINICAL DATA:  Shortness of breath for 2 days EXAM: PORTABLE CHEST 1 VIEW COMPARISON:  07/09/2019 FINDINGS: 2 frontal views of the chest demonstrate. A There is chronic stable cardiac silhouette central vascular congestion without airspace disease, effusion, or pneumothorax. No acute bony abnormalities. IMPRESSION: 1. Chronic central vascular congestion, no acute process. Electronically Signed   By: Sharlet Salina M.D.   On: 07/18/2019 22:34    Procedures Procedures (including critical care time)  Medications Ordered in ED Medications  albuterol (VENTOLIN HFA) 108 (90 Base) MCG/ACT inhaler 2 puff (2 puffs Inhalation Given 07/18/19 2317)  predniSONE (DELTASONE) tablet 60 mg (60 mg Oral Given 07/18/19 2211)  predniSONE (DELTASONE) tablet 20 mg (20 mg Oral Given 07/18/19 2316)    ED Course  I have reviewed the triage vital signs and the nursing notes.  Pertinent labs & imaging results that were available during my care of the patient were reviewed by me and considered in my medical decision making (see chart for details).    MDM Rules/Calculators/A&P                      Patient presents with severe respiratory extremis.  IV steroids given.  Will give beta agonist, oxygen, portable chest,  Covid.  Patient rechecked multiple times in the ED course.  He improved rapidly with steroids and MDI treatments.  He remained tachycardic but hemodynamically stable.  He uses oxygen at home.  He requested oral steroids for outpatient management.   CRITICAL CARE Performed by: Donnetta Hutching Total critical care time: 35 minutes Critical care time was exclusive of separately billable procedures and treating other patients. Critical care was necessary to treat or prevent imminent or life-threatening deterioration. Critical care was time spent personally by me on the following activities: development of treatment plan with patient and/or surrogate as well as nursing, discussions with consultants, evaluation of patient's response to treatment, examination of patient, obtaining history from patient or surrogate, ordering and performing treatments and interventions, ordering and review of laboratory studies, ordering and review of radiographic studies, pulse oximetry and re-evaluation of patient's condition. Final Clinical Impression(s) / ED Diagnoses Final diagnoses:  COPD exacerbation (HCC)    Rx / DC Orders ED Discharge Orders         Ordered    predniSONE (DELTASONE) 20 MG tablet  2 times daily with meals     07/18/19 2318           Donnetta Hutching, MD 07/18/19 2159    Donnetta Hutching, MD 07/18/19 2321

## 2019-07-18 NOTE — Discharge Instructions (Addendum)
Checks x-ray showed no pneumonia.  Covid negative.  Prescription for prednisone.  Come to the hospital before you get really sick.

## 2019-07-18 NOTE — ED Triage Notes (Signed)
Pt brought in by [facility] for shortness of breath. Pt with hx of COPD and states he has been having increased sob x 2 days.

## 2019-08-16 ENCOUNTER — Encounter: Payer: Self-pay | Admitting: Gastroenterology

## 2019-08-23 ENCOUNTER — Telehealth: Payer: Self-pay | Admitting: Pulmonary Disease

## 2019-08-23 NOTE — Telephone Encounter (Signed)
I spoke with Manuel Richard regarding this  She states that the pt has been dismissed from the practice and that this was clearly written on the cmn that was faxed back to [facility]  I spoke with [facility] and notified them of this and they will send cmn to his PCP or another provider

## 2019-08-25 ENCOUNTER — Telehealth: Payer: Self-pay | Admitting: Emergency Medicine

## 2019-08-25 ENCOUNTER — Telehealth (INDEPENDENT_AMBULATORY_CARE_PROVIDER_SITE_OTHER): Payer: Medicare Other | Admitting: Gastroenterology

## 2019-08-25 ENCOUNTER — Other Ambulatory Visit: Payer: Self-pay

## 2019-08-25 ENCOUNTER — Telehealth: Payer: Self-pay | Admitting: *Deleted

## 2019-08-25 ENCOUNTER — Encounter: Payer: Self-pay | Admitting: Gastroenterology

## 2019-08-25 DIAGNOSIS — K219 Gastro-esophageal reflux disease without esophagitis: Secondary | ICD-10-CM

## 2019-08-25 DIAGNOSIS — R1310 Dysphagia, unspecified: Secondary | ICD-10-CM | POA: Diagnosis not present

## 2019-08-25 DIAGNOSIS — R0602 Shortness of breath: Secondary | ICD-10-CM

## 2019-08-25 NOTE — Assessment & Plan Note (Signed)
SYMPTOMS NOT IDEALLY CONTROLLED AND LIKELY DUE TO UNCONTROLLED GERD.  REVIEWED NOTES FROM 2018 TO PRESENT. IF MEDS DON'T WORK, YOU CAN TRY TO CONTROL YOUR REFLUX/HEARTBURN BY DOING THE FOLLOWING:   1. USE TUMS OR GAS-X AS NEEDED.   2. CONTINUE YOUR WEIGHT LOSS EFFORTS.   3. AVOID REFLUX TRIGGERS.  HANDOUT GIVEN. SEE PULMONOLOGY. WILL NEED TO BE CLEARED/PULMONARTY REGIMEN MAXIMIZED PRIOR TO  ANESTHESIA. WE WILL SCHEDULE UPPER ENDOSCOPY TO STRETCH YOU ESOPHAGUS IN 6 WEEKS.  DISCUSSED PROCEDURE, BENEFITS, & RISKS: < 1% chance of medication reaction, bleeding, perforation, or ASPIRATION. FOLLOW UP IN THE OFFICE WILL BE SCHEDULED IF NEEDED AFTER ENDOSCOPY.  GREATER THAN 50% WAS SPENT IN REVIEWING EMR, COUNSELING & COORDINATION OF CARE WITH THE PATIENT: DISCUSSED PROCEDURE, BENEFITS, RISKS, AND MANAGEMENT OF GERD. TOTAL ENCOUNTER TIME: 40 MINS.

## 2019-08-25 NOTE — Progress Notes (Signed)
Subjective:    Patient ID: Manuel Richard, male    DOB: [birth year], 49 y.o.   MRN: [id number]   Primary Care Physician:  Grayland Jack, FNP  Primary GI:  Jonette Eva, MD   Patient Location: home   Provider Location: [facility] office   Reason for Visit: DYSPHAGIA   Virtual Visit via TELEPHONE   I connected with [name]  and verified that I am speaking with the correct person using two identifiers.   I discussed the limitations, risks, security and privacy concerns of performing an evaluation and management service by telephone/video and the availability of in person appointments. I also discussed with the patient that there may be a patient responsible charge related to this service. The patient expressed understanding and agreed to proceed.  HPI CONDUCTED VIRTUALLY. I ASKED ASKED QUESTIONS TO WIFE WHO THEN RELAYED THEN TO TO THE HUSBAND WHO ANSWERED. WIFE ANSWERED QUESTIONS MORE READILY THAN PT. PT REFUSED TO SPEAK DIRECTLY WITH ME ON THE PHONE.   PT HAVING TROUBLE WITH SWALLOWING FOR MOS. CAN CHOKE ON HIS OWN SPIT. TROUBLE WITH SOLIDS. SHE CUTS UP FOOD SMALL FOR HIM TO SWALLOW, NO WEIGHT LOSS. FEELS BLOATED OFTEN HAS TROUBLE BREATHING AND SEEN IN ED x 2 IN FEB 2021. DISMISSED FROM PULMONOLOGY PRACTICE OCT 202 BUT STATED TO ME HE NEVER SAW A LUNG DOCTOR. BMs: DAILY WATER LIKE MILK SHAKE WITH LINZESS. CAN' WEAR A FACE MASK BECAUSE HE'S SOB. HAS A FACE SHIELD. HEARTBURN NOT CONTROLLED WITH ANY RX MEDS PPI OR H2B. USES TUMS OR GAS-X. HAS HEARTBURN 3-4X/DAY. WEIGHT STABLE 266 LBS IN 2019 AND NOW 270 LBS. HAS BEEN SEEN BY 2 OTHER GI DOCS IN [place].  PT DENIES FEVER, CHILLS, HEMATOCHEZIA, HEMATEMESIS, nausea, vomiting, melena, diarrhea, CHEST PAIN, CHANGE IN BOWEL IN HABITS, abdominal pain, OR problems with sedation.   Current Outpatient Medications  Medication Sig    . albuterol (PROVENTIL HFA;VENTOLIN HFA) 108 (90 Base) MCG/ACT inhaler Inhale 2 puffs into the lungs every 6  (six) hours as needed for wheezing or shortness of breath.    Marland Kitchen aspirin EC 81 MG tablet Take 81 mg by mouth daily.    Marland Kitchen atorvastatin (LIPITOR) 40 MG tablet Take 40 mg by mouth daily at 6 PM.     . buPROPion (WELLBUTRIN XL) 150 MG 24 hr tablet Take 150 mg by mouth daily.    . cyclobenzaprine (FLEXERIL) 10 MG tablet Take 1 tablet by mouth every 12 (twelve) hours.    Marland Kitchen doxycycline (VIBRA-TABS) 100 MG tablet Take 1 tablet (100 mg total) by mouth 2 (two) times daily. (Patient not taking: Reported on 05/14/2019)    . ezetimibe (ZETIA) 10 MG tablet Take 10 mg by mouth at bedtime.    . furosemide (LASIX) 20 MG tablet Take 20 mg by mouth daily.     Marland Kitchen gabapentin (NEURONTIN) 600 MG tablet Take 600 mg by mouth 2 (two) times daily.    Marland Kitchen HYDROcodone-acetaminophen (NORCO) 10-325 MG tablet Take 1 tablet by mouth every 4 (four) hours as needed for pain.    Marland Kitchen ipratropium-albuterol (DUONEB) 0.5-2.5 (3) MG/3ML SOLN Take 3 mLs by nebulization 3 (three) times daily.    Marland Kitchen LINZESS 290 MCG CAPS capsule Take 290 mcg by mouth daily.    .      . predniSONE (DELTASONE) 20 MG tablet Take 1 tablet (20 mg total) by mouth 2 (two) times daily with a meal.    . sildenafil (REVATIO) 20 MG tablet Take 5 tablets  by mouth as needed.    Marland Kitchen STIOLTO RESPIMAT 2.5-2.5 MCG/ACT AERS Inhale 2 puffs into the lungs daily.    . Vitamin D, Ergocalciferol, (DRISDOL) 50000 UNITS CAPS capsule every 7 (seven) days.      Review of Systems PER HPI OTHERWISE ALL SYSTEMS ARE NEGATIVE.    Objective:   Physical Exam  VIRTUAL VISIT DUE TO COVID 19, VISIT IS CONDUCTED VIRTUALLY.      Assessment & Plan:

## 2019-08-25 NOTE — Patient Instructions (Addendum)
IF MEDS DON'T WORK, YOU CAN TRY TO CONTROL YOUR REFLUX/HEARTBURN BY DOING THE FOLLOWING:    1. USE TUMS OR GAS-X AS NEEDED.    2. CONTINUE YOUR WEIGHT LOSS EFFORTS.    3. AVOID REFLUX TRIGGERS. SEE INFO BELOW.   SEE PULMONOLOGY. YOU WILL NEED TO BE CLEARED BY THEM TO HAVE ANESTHESIA.  WE WILL SCHEDULE UPPER ENDOSCOPY TO STRETCH YOU ESOPHAGUS IN 6 WEEKS.  FOLLOW UP IN THE OFFICE WILL BE SCHEDULED IF NEEDED AFTER ENDOSCOPY.

## 2019-08-25 NOTE — Telephone Encounter (Signed)
LMOVM for pt  Patient needs referral to pulmonary dx SOB. He needs to be cleared prior to EGD/dil with mac being scheduled. Patient has been discharged from [facility] [place]. Spoke with SLF and made aware. She advised me to call patient to see where he would like referral to be sent.

## 2019-08-25 NOTE — Telephone Encounter (Signed)
Called pt to verify information for his video  visit with the provider today. Upon pt picking up the phone he began yelling at me saying "I have no idea why we are doing a video visit! My airway is closing up and I need an office visit! There is no way this can be done on the phone!" the pt continued yelling but I was unable to understand him. Was able to briefly interrupt for just a second  to say  " hang on for just a minute" and  placed him on hold.

## 2019-08-25 NOTE — Telephone Encounter (Signed)
Noted  

## 2019-08-25 NOTE — Telephone Encounter (Signed)
Picked call up and informed pt that we don't schedule my chart or video visits without the consent of the pt.  I explained to him that we do not do any procedures in our office but could order any necessary testing that may be required.  Encouraged pt to keep his video visit with SLF because she could determine what test would be needed in order to help with the problem that he is experiencing.  Pt seemed to understand.  All I heard after that was silence so I don't know if his phone went out or if the call was disconnected.  Tried to call pt back but no answer.  SLF was able to make contact with him shortly after.

## 2019-08-25 NOTE — Telephone Encounter (Signed)
Spoke with spouse crystal. Patient is being seen at [facility] in [facility] for cardiology. I have faxed referral to [facility] for pulmonary dept.

## 2019-08-25 NOTE — Assessment & Plan Note (Addendum)
SYMPTOMS NOT IDEALLY CONTROLLED AND LIKELY DUE TO UNCONTROLLED GERD.  REVIEWED NOTES FROM 2018 TO PRESENT. IF MEDS DON'T WORK, YOU CAN TRY TO CONTROL YOUR REFLUX/HEARTBURN BY DOING THE FOLLOWING:   1. USE TUMS OR GAS-X AS NEEDED.   2. CONTINUE YOUR WEIGHT LOSS EFFORTS.   3. AVOID REFLUX TRIGGERS.  HANDOUT GIVEN. SEE PULMONOLOGY. WILL NEED TO BE CLEARED/PULMONARTY REGIMEN MAXIMIZED PRIOR TO  ANESTHESIA. WE WILL SCHEDULE UPPER ENDOSCOPY TO STRETCH YOU ESOPHAGUS IN 6 WEEKS.  DISCUSSED PROCEDURE, BENEFITS, & RISKS: < 1% chance of medication reaction, bleeding, perforation, or ASPIRATION. FOLLOW UP IN THE OFFICE WILL BE SCHEDULED IF NEEDED AFTER ENDOSCOPY.

## 2019-08-30 NOTE — Telephone Encounter (Signed)
[facility] and was advised patient saw Dr. Tomasita Morrow 07/27/19 as a new patient and has pending f/u appt 09/07/2019.

## 2019-09-16 ENCOUNTER — Encounter: Payer: Self-pay | Admitting: Emergency Medicine

## 2019-09-16 ENCOUNTER — Telehealth: Payer: Self-pay | Admitting: Emergency Medicine

## 2019-09-16 NOTE — Telephone Encounter (Signed)
Letter faxed to [facility] for clearance

## 2019-09-24 ENCOUNTER — Telehealth: Payer: Self-pay | Admitting: Emergency Medicine

## 2019-09-24 NOTE — Telephone Encounter (Signed)
Noted  

## 2019-09-24 NOTE — Telephone Encounter (Signed)
Just a little FYI. Called [facility] to f/u with medical clearance. Stated they would call me back  They were seeing pts

## 2019-09-28 ENCOUNTER — Telehealth: Payer: Self-pay | Admitting: Emergency Medicine

## 2019-09-28 NOTE — Telephone Encounter (Signed)
Spoke to tiffany at [employer]. She stated she does not have the request for medical clearance from our office that was sent on 4/22. refaxed to [phone]

## 2019-09-29 ENCOUNTER — Telehealth: Payer: Self-pay | Admitting: Emergency Medicine

## 2019-09-29 NOTE — Telephone Encounter (Signed)
Fowarding to Darl Pikes to request medical record from Dr. Tomasita Morrow with [facility]

## 2019-09-29 NOTE — Telephone Encounter (Signed)
Per encounter form it needs to be given to an APP to review prior to scheduling.

## 2019-09-29 NOTE — Telephone Encounter (Signed)
Reviewed note. States "approve". Medically fit. Please request last pulmonary note from Dr. Beth[facility] Dicker so we can have this on file.

## 2019-09-29 NOTE — Telephone Encounter (Signed)
Ab was the last app that saw pt, clearance from [facility] was placed in her box to be reviewed

## 2019-09-29 NOTE — Telephone Encounter (Signed)
REVIEWED. AGREE. NO ADDITIONAL RECOMMENDATIONS. 

## 2019-09-29 NOTE — Telephone Encounter (Signed)
Pulmonology clearance from [facility]. pt was approved to be medically fit for surgery  Placed in scan box for the scan center

## 2019-10-11 NOTE — Telephone Encounter (Signed)
Requested records.

## 2019-10-24 ENCOUNTER — Emergency Department (HOSPITAL_COMMUNITY)
Admission: EM | Admit: 2019-10-24 | Discharge: 2019-10-24 | Disposition: A | Discharge status: Home / Self Care | Payer: Medicare Other | Attending: Emergency Medicine | Admitting: Emergency Medicine

## 2019-10-24 ENCOUNTER — Encounter (HOSPITAL_COMMUNITY): Payer: Self-pay

## 2019-10-24 ENCOUNTER — Other Ambulatory Visit: Payer: Self-pay

## 2019-10-24 DIAGNOSIS — Z79899 Other long term (current) drug therapy: Secondary | ICD-10-CM | POA: Diagnosis not present

## 2019-10-24 DIAGNOSIS — J441 Chronic obstructive pulmonary disease with (acute) exacerbation: Secondary | ICD-10-CM | POA: Diagnosis not present

## 2019-10-24 DIAGNOSIS — Z20822 Contact with and (suspected) exposure to covid-19: Secondary | ICD-10-CM | POA: Diagnosis not present

## 2019-10-24 DIAGNOSIS — R0602 Shortness of breath: Secondary | ICD-10-CM | POA: Diagnosis present

## 2019-10-24 DIAGNOSIS — Z7982 Long term (current) use of aspirin: Secondary | ICD-10-CM | POA: Insufficient documentation

## 2019-10-24 DIAGNOSIS — F17210 Nicotine dependence, cigarettes, uncomplicated: Secondary | ICD-10-CM | POA: Insufficient documentation

## 2019-10-24 LAB — CBC WITH DIFFERENTIAL/PLATELET
Abs Immature Granulocytes: 0.01 10*3/uL (ref 0.00–0.07)
Basophils Absolute: 0.1 10*3/uL (ref 0.0–0.1)
Basophils Relative: 1 %
Eosinophils Absolute: 0.4 10*3/uL (ref 0.0–0.5)
Eosinophils Relative: 5 %
HCT: 38 % — ABNORMAL LOW (ref 39.0–52.0)
Hemoglobin: 11.9 g/dL — ABNORMAL LOW (ref 13.0–17.0)
Immature Granulocytes: 0 %
Lymphocytes Relative: 30 %
Lymphs Abs: 2.4 10*3/uL (ref 0.7–4.0)
MCH: 31.2 pg (ref 26.0–34.0)
MCHC: 31.3 g/dL (ref 30.0–36.0)
MCV: 99.5 fL (ref 80.0–100.0)
Monocytes Absolute: 0.8 10*3/uL (ref 0.1–1.0)
Monocytes Relative: 10 %
Neutro Abs: 4.5 10*3/uL (ref 1.7–7.7)
Neutrophils Relative %: 54 %
Platelets: 287 10*3/uL (ref 150–400)
RBC: 3.82 MIL/uL — ABNORMAL LOW (ref 4.22–5.81)
RDW: 12.9 % (ref 11.5–15.5)
WBC: 8.1 10*3/uL (ref 4.0–10.5)
nRBC: 0 % (ref 0.0–0.2)

## 2019-10-24 LAB — SARS CORONAVIRUS 2 BY RT PCR (HOSPITAL ORDER, PERFORMED IN ~~LOC~~ HOSPITAL LAB): SARS Coronavirus 2: NEGATIVE

## 2019-10-24 LAB — BASIC METABOLIC PANEL
Anion gap: 8 (ref 5–15)
BUN: 7 mg/dL (ref 6–20)
CO2: 26 mmol/L (ref 22–32)
Calcium: 9.1 mg/dL (ref 8.9–10.3)
Chloride: 106 mmol/L (ref 98–111)
Creatinine, Ser: 0.92 mg/dL (ref 0.61–1.24)
GFR calc Af Amer: >60 mL/min (ref >60)
GFR calc non Af Amer: >60 mL/min (ref >60)
Glucose, Bld: 107 mg/dL — ABNORMAL HIGH (ref 70–99)
Potassium: 3.4 mmol/L — ABNORMAL LOW (ref 3.5–5.1)
Sodium: 140 mmol/L (ref 135–145)

## 2019-10-24 LAB — TROPONIN I (HIGH SENSITIVITY): Troponin I (High Sensitivity): 3 ng/L (ref <18)

## 2019-10-24 MED ORDER — PREDNISONE 50 MG PO TABS
ORAL_TABLET | ORAL | 0 refills | Status: DC
Start: 2019-10-24 — End: 2020-01-28

## 2019-10-24 MED ORDER — ALBUTEROL SULFATE HFA 108 (90 BASE) MCG/ACT IN AERS
4.0000 | INHALATION_SPRAY | Freq: Once | RESPIRATORY_TRACT | Status: AC
  Administered 2019-10-24: 4 via RESPIRATORY_TRACT
  Filled 2019-10-24: qty 6.7

## 2019-10-24 MED ORDER — DEXAMETHASONE SODIUM PHOSPHATE 10 MG/ML IJ SOLN
10.0000 mg | Freq: Once | INTRAMUSCULAR | Status: AC
  Administered 2019-10-24: 10 mg via INTRAMUSCULAR
  Filled 2019-10-24: qty 1

## 2019-10-24 MED ORDER — FLUTICASONE-SALMETEROL 250-50 MCG/DOSE IN AEPB: 1.0000 | INHALATION_SPRAY | Freq: Two times a day (BID) | RESPIRATORY_TRACT | 0 refills | Status: AC

## 2019-10-24 MED ORDER — IPRATROPIUM-ALBUTEROL 0.5-2.5 (3) MG/3ML IN SOLN: 3.0000 mL | Freq: Once | RESPIRATORY_TRACT | Status: DC

## 2019-10-24 MED ORDER — ALBUTEROL SULFATE HFA 108 (90 BASE) MCG/ACT IN AERS
4.0000 | INHALATION_SPRAY | Freq: Once | RESPIRATORY_TRACT | Status: AC
  Administered 2019-10-24: 4 via RESPIRATORY_TRACT

## 2019-10-24 MED ORDER — AEROCHAMBER Z-STAT PLUS/MEDIUM MISC
1.0000 | Freq: Once | Status: AC
  Administered 2019-10-24: 1

## 2019-10-24 MED ORDER — PREDNISONE 50 MG PO TABS
60.0000 mg | ORAL_TABLET | Freq: Once | ORAL | Status: AC
  Administered 2019-10-24: 60 mg via ORAL
  Filled 2019-10-24: qty 1

## 2019-10-24 MED ORDER — DEXAMETHASONE SODIUM PHOSPHATE 10 MG/ML IJ SOLN: 10.0000 mg | Freq: Once | INTRAMUSCULAR | Status: DC

## 2019-10-24 NOTE — Discharge Instructions (Addendum)
Complete the prednisone prescribed taking your next dose tomorrow.  I have also prescribed you a maintenance medication to help you better control your COPD exacerbations.  This is an inhaled steroid.  You should not start this medication until your prednisone tablets are finished.  You may continue using your albuterol MDI if needed for continued shortness of breath or wheezing.  Once the new inhaler becomes effective, you should be able to cut way back on the frequency of needing the albuterol inhaler.  Get rechecked here for any worsening symptoms.  Also plan to follow-up with your primary provider within the next 1 to 2 weeks.

## 2019-10-24 NOTE — ED Triage Notes (Signed)
Pt brought in by EMS due to SOB for 3 days. Sats 99% on RA . Ambulated to truck.  Refused to wear mask upon arrival

## 2019-10-24 NOTE — ED Provider Notes (Signed)
[facility] EMERGENCY DEPARTMENT Provider Note   CSN: [id number] Arrival date & time: 10/24/19  1606     History Chief Complaint  Patient presents with  . Shortness of Breath    Manuel Richard is a 49 y.o. male with past back: History as outlined below, most significant for history of COPD, GERD, hypercholesterolemia and history of stroke presenting with a 3-day history of worsening shortness of breath and wheezing.  He denies chest pain, he has had a nonproductive cough, denies fevers or chills, also no nausea, vomiting or diaphoresis.  He denies peripheral edema or calf pain.  He states his symptoms are similar to prior flares of his COPD.  He has been using his albuterol MDI without significant improvement in his symptoms.  His symptoms are worse with exertion and better at rest.  He denies orthopnea.  The history is provided by the patient.       Past Medical History:  Diagnosis Date  . Anemia   . Chronic back pain   . Chronic pain syndrome   . Chronic pain syndrome   . Chronic sinusitis   . COPD (chronic obstructive pulmonary disease) ([place])   . Dyslipidemia   . Dysphagia   . Epididymitis    Chlamydial, Right  . Erectile dysfunction   . Fatigue   . GERD (gastroesophageal reflux disease)   . Groin pain   . High cholesterol   . Hydrocele    bilateral  . Lumbar radiculopathy   . Nicotine addiction   . Rectal bleeding   . Shortness of breath dyspnea   . Stroke ([place]) 2006   x 2     . Testicular pain     Patient Active Problem List   Diagnosis Date Noted  . GERD (gastroesophageal reflux disease)   . Anemia 04/20/2018  . Chronic pain syndrome 04/20/2018  . Dyslipidemia 04/20/2018  . Vitamin D deficiency 04/20/2018  . Chronic low back pain (Primary Area of Pain) (Bilateral) (L>R) w/ sciatica (Left) 04/20/2018  . Chronic lower extremity pain (Secondary Area of Pain) (Left) 04/20/2018  . Pharmacologic therapy 04/20/2018  . Disorder of skeletal system 04/20/2018   . Problems influencing health status 04/20/2018  . Long term current use of opiate analgesic 04/20/2018  . 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 Rectal bleeding 03/17/2018  . Dysphagia 03/17/2018  . Chronic low back pain (Midline) w/o sciatica 05/31/2015  . Back pain at L4-L5 level 02/15/2015  . DDD (degenerative disc disease), lumbosacral 02/15/2015  . Lumbar radiculopathy 02/15/2015  . Drug-seeking behavior 01/12/2015  . Bilateral hydrocele 01/12/2015  . Bilateral varicoceles 01/12/2015  . Orchitis and epididymitis 12/16/2014  . Epididymitis 11/29/2014  . Microscopic hematuria 11/29/2014    Past Surgical History:  Procedure Laterality Date  . APPENDECTOMY         Family History  Problem Relation Age of Onset  . Cancer Mother   . Cancer Father   . Colon cancer Neg Hx   . Colon polyps Neg Hx     Social History   Tobacco Use  . Smoking status: Current Every Day Smoker    Packs/day: 1.00    Years: 31.00    Pack years: 31.00    Types: Cigarettes  . Smokeless tobacco: Never Used  Substance Use Topics  . Alcohol use: No    Alcohol/week: 0.0 standard drinks    Comment: no longer  . Drug use: No    Home Medications Prior to Admission medications  Medication Sig Start Date End Date Taking? Authorizing Provider  albuterol (PROVENTIL HFA;VENTOLIN HFA) 108 (90 Base) MCG/ACT inhaler Inhale 2 puffs into the lungs every 6 (six) hours as needed for wheezing or shortness of breath.    [provider]  aspirin EC 81 MG tablet Take 81 mg by mouth daily.    [provider]  atorvastatin (LIPITOR) 40 MG tablet Take 40 mg by mouth daily at 6 PM.  08/16/18   [provider]  buPROPion (WELLBUTRIN XL) 150 MG 24 hr tablet Take 150 mg by mouth daily.    [provider]  cyclobenzaprine (FLEXERIL) 10 MG tablet Take 1 tablet by mouth every 12 (twelve) hours. 06/15/19   [provider]  doxycycline (VIBRA-TABS) 100 MG tablet Take 1  tablet (100 mg total) by mouth 2 (two) times daily. Patient not taking: Reported on 05/14/2019 10/20/18   Merwyn Katos, MD  ezetimibe (ZETIA) 10 MG tablet Take 10 mg by mouth at bedtime. 03/17/19   [provider]  Fluticasone-Salmeterol (ADVAIR DISKUS) 250-50 MCG/DOSE AEPB Inhale 1 puff into the lungs in the morning and at bedtime. 10/24/19   Burgess Amor, PA-C  furosemide (LASIX) 20 MG tablet Take 20 mg by mouth daily.  12/24/17   [provider]  gabapentin (NEURONTIN) 600 MG tablet Take 600 mg by mouth 2 (two) times daily. 04/11/19   [provider]  HYDROcodone-acetaminophen (NORCO) 10-325 MG tablet Take 1 tablet by mouth every 4 (four) hours as needed for pain. 07/02/19   [provider]  ipratropium-albuterol (DUONEB) 0.5-2.5 (3) MG/3ML SOLN Take 3 mLs by nebulization 3 (three) times daily.    [provider]  LINZESS 290 MCG CAPS capsule Take 290 mcg by mouth daily. 05/04/19   [provider]  predniSONE (DELTASONE) 50 MG tablet Take one tablet daily for 5 days 10/24/19   Burgess Amor, PA-C  sildenafil (REVATIO) 20 MG tablet Take 5 tablets by mouth as needed. 02/22/18   [provider]  STIOLTO RESPIMAT 2.5-2.5 MCG/ACT AERS Inhale 2 puffs into the lungs daily. 07/05/18   [provider]  Vitamin D, Ergocalciferol, (DRISDOL) 50000 UNITS CAPS capsule every 7 (seven) days.  12/23/14   [provider]    Allergies    Patient has no known allergies.  Review of Systems   Review of Systems  Constitutional: Negative for chills, diaphoresis and fever.  HENT: Negative for congestion and sore throat.   Eyes: Negative.   Respiratory: Positive for cough, chest tightness, shortness of breath and wheezing.   Cardiovascular: Negative for chest pain, palpitations and leg swelling.  Gastrointestinal: Negative for abdominal pain, nausea and vomiting.  Genitourinary: Negative.   Musculoskeletal: Negative for arthralgias, joint  swelling and neck pain.  Skin: Negative.  Negative for rash and wound.  Neurological: Negative for dizziness, weakness, light-headedness, numbness and headaches.  Psychiatric/Behavioral: Negative.     Physical Exam Updated Vital Signs BP 111/74   Pulse 77   Temp 98 F (36.7 C) (Oral)   Resp (!) 29   Ht 6' (1.829 m)   Wt 122.5 kg   SpO2 97%   BMI 36.62 kg/m   Physical Exam Vitals and nursing note reviewed.  Constitutional:      Appearance: He is well-developed.  HENT:     Head: Normocephalic and atraumatic.  Eyes:     Conjunctiva/sclera: Conjunctivae normal.  Cardiovascular:     Rate and Rhythm: Normal rate and regular rhythm.     Heart  sounds: Normal heart sounds.  Pulmonary:     Effort: Pulmonary effort is normal.     Breath sounds: Decreased breath sounds present. No wheezing, rhonchi or rales.  Abdominal:     General: Bowel sounds are normal.     Palpations: Abdomen is soft.     Tenderness: There is no abdominal tenderness.  Musculoskeletal:        General: Normal range of motion.     Cervical back: Normal range of motion.     Right lower leg: No tenderness. No edema.     Left lower leg: No tenderness. No edema.  Skin:    General: Skin is warm and dry.  Neurological:     Mental Status: He is alert.     ED Results / Procedures / Treatments   Labs (all labs ordered are listed, but only abnormal results are displayed) Labs Reviewed  CBC WITH DIFFERENTIAL/PLATELET - Abnormal; Notable for the following components:      Result Value   RBC 3.82 (*)    Hemoglobin 11.9 (*)    HCT 38.0 (*)    All other components within normal limits  BASIC METABOLIC PANEL - Abnormal; Notable for the following components:   Potassium 3.4 (*)    Glucose, Bld 107 (*)    All other components within normal limits  SARS CORONAVIRUS 2 BY RT PCR [facility] ORDER, PERFORMED IN [facility] LAB)  TROPONIN I (HIGH SENSITIVITY)    EKG EKG Interpretation  Date/Time:  Sunday  Oct 24 2019 16:40:51 EDT Ventricular Rate:  80 PR Interval:    QRS Duration: 106 QT Interval:  394 QTC Calculation: 455 R Axis:   49 Text Interpretation: Sinus rhythm Borderline low voltage, extremity leads Abnormal R-wave progression, early transition No significant change since 08/01/2018 Confirmed by Geoffery Lyons ([id number]) on 10/24/2019 4:48:10 PM   Radiology No results found.  Procedures Procedures (including critical care time)  Medications Ordered in ED Medications  ipratropium-albuterol (DUONEB) 0.5-2.5 (3) MG/3ML nebulizer solution 3 mL (3 mLs Nebulization Refused 10/24/19 1922)  albuterol (VENTOLIN HFA) 108 (90 Base) MCG/ACT inhaler 4 puff (4 puffs Inhalation Given 10/24/19 1641)  dexamethasone (DECADRON) injection 10 mg (10 mg Intramuscular Given 10/24/19 1641)  predniSONE (DELTASONE) tablet 60 mg (60 mg Oral Given 10/24/19 1900)  albuterol (VENTOLIN HFA) 108 (90 Base) MCG/ACT inhaler 4 puff (4 puffs Inhalation Given 10/24/19 1901)  aerochamber Z-Stat Plus/medium 1 each (1 each Other Given 10/24/19 1901)    ED Course  I have reviewed the triage vital signs and the nursing notes.  Pertinent labs & imaging results that were available during my care of the patient were reviewed by me and considered in my medical decision making (see chart for details).    MDM Rules/Calculators/A&P                      Patient with a history of COPD, with a 3-day history of increased shortness of breath and wheezing.  He was given albuterol MDI along with IM Decadron.  He endorses no significant improvement after receiving these medications stating he "always gets prednisone tablets" which always work for him.  He had not received a full dosing of steroids based on his body weight with the Decadron so was given an additional dose of prednisone tablets.  He was also given an additional round of albuterol MDI this time using a spacer after which time his breathing greatly improved.  Once his Covid test  was  resulted and negative he was offered a nebulizer treatment, but states he felt improved enough [place] home and he refused further treatments.  He was stable at time of discharge.  On repeat exam his lungs were clear bilaterally, no wheezing, no rales.  He does state he takes multiple doses of his albuterol daily and feels like this medication does not work for him anymore.  He is also on a long-acting bronchodilator, but he is not on a maintenance steroid inhaler.  He was prescribed an Advair discus and advised to start taking this medication after the prednisone pulse dosing is completed.  He was given a 5-day prescription for the prednisone.  He was given strict return precautions and otherwise plan to follow-up with his PCP for recheck within the next 1 to 2 weeks.  Patient was felt stable for discharge and he felt well upon discharge from the department. Final Clinical Impression(s) / ED Diagnoses Final diagnoses:  COPD exacerbation (HCC)    Rx / DC Orders ED Discharge Orders         Ordered    predniSONE (DELTASONE) 50 MG tablet     10/24/19 1933    Fluticasone-Salmeterol (ADVAIR DISKUS) 250-50 MCG/DOSE AEPB  2 times daily     10/24/19 1933           Victoriano Lain 10/24/19 2133    Geoffery Lyons, MD 10/24/19 2315

## 2019-12-02 ENCOUNTER — Encounter: Payer: Self-pay | Admitting: Gastroenterology

## 2019-12-02 NOTE — Telephone Encounter (Signed)
Stacey please book appt thanks

## 2019-12-02 NOTE — Telephone Encounter (Signed)
Hasn't been seen in 4 months. Probably best to have him seen by any APP that is available prior to scheduling. Can we tentatively put him on books?

## 2019-12-02 NOTE — Telephone Encounter (Signed)
PATIENT SCHEDULED AND LETTER SENT  °

## 2019-12-02 NOTE — Telephone Encounter (Signed)
See Epic notes patient was in the ED 10/24/2019 with COPD exacerbation. Please advise AB if okay to proceed scheduling? thanks

## 2019-12-31 ENCOUNTER — Emergency Department (HOSPITAL_COMMUNITY)
Admission: EM | Admit: 2019-12-31 | Discharge: 2019-12-31 | Disposition: A | Discharge status: Home / Self Care | Payer: Medicare Other | Attending: Emergency Medicine | Admitting: Emergency Medicine

## 2019-12-31 ENCOUNTER — Other Ambulatory Visit: Payer: Self-pay

## 2019-12-31 DIAGNOSIS — Z5321 Procedure and treatment not carried out due to patient leaving prior to being seen by health care provider: Secondary | ICD-10-CM | POA: Diagnosis not present

## 2019-12-31 DIAGNOSIS — R0602 Shortness of breath: Secondary | ICD-10-CM | POA: Insufficient documentation

## 2019-12-31 NOTE — ED Notes (Signed)
Pt standing outside when called for triage. I asked pt to put a mask on before coming into the building and he stated he did not have one. I gave pt a mask and he refused to wear it. I told him he could not come in without it. Pt states she cannot breathe with it on however he as speaking in complete sentences. I told him he would have to wear it when coming in and being triaged then he could go back outside until he was called to a room. Pt still stating he could not wear the mask. Charge nurse notified and nursing [facility], Tim spoke with pt also. Pt decided to leave the facility.

## 2019-12-31 NOTE — ED Triage Notes (Signed)
Pt has hx of copd and is having increased shortness of breath for past few days. Pt is talking in complete sentences, resp even and non labored.

## 2020-01-01 ENCOUNTER — Emergency Department (HOSPITAL_COMMUNITY)
Admission: EM | Admit: 2020-01-01 | Discharge: 2020-01-01 | Discharge status: Against Medical Advice | Payer: Medicare Other

## 2020-01-28 ENCOUNTER — Other Ambulatory Visit: Payer: Self-pay

## 2020-01-28 ENCOUNTER — Encounter: Payer: Self-pay | Admitting: Gastroenterology

## 2020-01-28 ENCOUNTER — Telehealth: Payer: Self-pay | Admitting: *Deleted

## 2020-01-28 ENCOUNTER — Telehealth (INDEPENDENT_AMBULATORY_CARE_PROVIDER_SITE_OTHER): Payer: Medicare Other | Admitting: Gastroenterology

## 2020-01-28 DIAGNOSIS — R1310 Dysphagia, unspecified: Secondary | ICD-10-CM | POA: Diagnosis not present

## 2020-01-28 DIAGNOSIS — K625 Hemorrhage of anus and rectum: Secondary | ICD-10-CM

## 2020-01-28 NOTE — Telephone Encounter (Signed)
Manuel Richard, you are scheduled for a virtual visit with your provider today.  Just as we do with appointments in the office, we must obtain your consent to participate.  Your consent will be active for this visit and any virtual visit you may have with one of our providers in the next 365 days.  If you have a MyChart account, I can also send a copy of this consent to you electronically.  All virtual visits are billed to your insurance company just like a traditional visit in the office.  As this is a virtual visit, video technology does not allow for your provider to perform a traditional examination.  This may limit your provider's ability to fully assess your condition.  If your provider identifies any concerns that need to be evaluated in person or the need to arrange testing such as labs, EKG, etc, we will make arrangements to do so.  Although advances in technology are sophisticated, we cannot ensure that it will always work on either your end or our end.  If the connection with a video visit is poor, we may have to switch to a telephone visit.  With either a video or telephone visit, we are not always able to ensure that we have a secure connection.   I need to obtain your verbal consent now.   Are you willing to proceed with your visit today?

## 2020-01-28 NOTE — Telephone Encounter (Signed)
Pt consented to a virtual visit. 

## 2020-01-28 NOTE — Patient Instructions (Signed)
We are arranging a colonoscopy, endoscopy and dilation in the near future.  Please call if you have any changes in your breathing in the meantime.  I enjoyed talking with you again today! As you know, I value our relationship and want to provide genuine, compassionate, and quality care. I welcome your feedback. If you receive a survey regarding your visit,  I greatly appreciate you taking time to fill this out. See you next time!  Gelene Mink, PhD, ANP-BC [facility]

## 2020-01-28 NOTE — Progress Notes (Signed)
Primary Care Physician:  Grayland Jack, FNP  Primary GI: Dr. Marletta Lor  Patient Location: Home   Provider Location: [facility] office   Reason for Visit: Follow-up and arrange procedures.    Persons present on the virtual encounter, with roles: Patient, wife, and NP   Total time (minutes) spent on medical discussion: 15 minutes   Due to COVID-19, visit was conducted using virtual method.  Visit was requested by patient.  Virtual Visit via Telephone Note Due to COVID-19, visit is conducted virtually and was requested by patient.   I connected with Manuel Richard on 01/28/20 at  8:30 AM EDT by telephone and verified that I am speaking with the correct person using two identifiers.   I discussed the limitations, risks, security and privacy concerns of performing an evaluation and management service by telephone and the availability of in person appointments. I also discussed with the patient that there may be a patient responsible charge related to this service. The patient expressed understanding and agreed to proceed.  Chief Complaint  Patient presents with  . Dysphagia     History of Present Illness: 49 year old male presenting via telephone call today to arrange EGD/dilation. He initially was to be face-to-face, but he notes difficulty wearing a mask and declined to wear one today for the appointment. He had left by the time I was able to call him, so I was unable to at least do a physical exam.   Patient has been seen by several other GI practices as well as [facility]. He had been scheduled for EGD/colonoscopy on other occasions but has not followed through. We have not seen him physically in the office since Oct 2019.   History of COPD. Received pulmonary clearance from pulmonologist (Dr. Bethanie Dicker at [facility]). 2 liters O2 at night and intermittently during the day.   Dysphagia chronically but better the past few days. Used to choke on water. Doesn't eat much.  No abdominal pain.   Linzess 290 mcg for constipation. Works well for patient. No rectal bleeding. Remote history of rectal bleeding but not recently.   Wt: 240 Ht: 6'0" BMI 32.5  Past Medical History:  Diagnosis Date  . Anemia   . Chronic back pain   . Chronic pain syndrome   . Chronic pain syndrome   . Chronic sinusitis   . COPD (chronic obstructive pulmonary disease) (HCC)   . Dyslipidemia   . Dysphagia   . Epididymitis    Chlamydial, Right  . Erectile dysfunction   . Fatigue   . GERD (gastroesophageal reflux disease)   . Groin pain   . High cholesterol   . Hydrocele    bilateral  . Lumbar radiculopathy   . Nicotine addiction   . Rectal bleeding   . Shortness of breath dyspnea   . Stroke (HCC) 2006   x 2     . Testicular pain      Past Surgical History:  Procedure Laterality Date  . APPENDECTOMY       Current Meds  Medication Sig  . albuterol (PROVENTIL HFA;VENTOLIN HFA) 108 (90 Base) MCG/ACT inhaler Inhale 2 puffs into the lungs every 6 (six) hours as needed for wheezing or shortness of breath.  Marland Kitchen aspirin EC 81 MG tablet Take 81 mg by mouth daily.  Marland Kitchen atorvastatin (LIPITOR) 40 MG tablet Take 40 mg by mouth daily at 6 PM.   . buPROPion (WELLBUTRIN XL) 150 MG 24 hr tablet Take 150 mg by  mouth daily.  . cyclobenzaprine (FLEXERIL) 10 MG tablet Take 1 tablet by mouth every 12 (twelve) hours.  Marland Kitchen ezetimibe (ZETIA) 10 MG tablet Take 10 mg by mouth at bedtime.  . Fluticasone-Salmeterol (ADVAIR DISKUS) 250-50 MCG/DOSE AEPB Inhale 1 puff into the lungs in the morning and at bedtime.  . furosemide (LASIX) 20 MG tablet Take 20 mg by mouth daily.   Marland Kitchen gabapentin (NEURONTIN) 600 MG tablet Take 600 mg by mouth 2 (two) times daily.  Marland Kitchen HYDROcodone-acetaminophen (NORCO) 10-325 MG tablet Take 1 tablet by mouth every 4 (four) hours as needed for pain.  Marland Kitchen ipratropium-albuterol (DUONEB) 0.5-2.5 (3) MG/3ML SOLN Take 3 mLs by nebulization 3 (three) times daily.  Marland Kitchen LINZESS 290 MCG  CAPS capsule Take 290 mcg by mouth daily.  . sildenafil (REVATIO) 20 MG tablet Take 5 tablets by mouth as needed.  Marland Kitchen STIOLTO RESPIMAT 2.5-2.5 MCG/ACT AERS Inhale 2 puffs into the lungs daily.  . Vitamin D, Ergocalciferol, (DRISDOL) 50000 UNITS CAPS capsule every 7 (seven) days.      Family History  Problem Relation Age of Onset  . Cancer Mother   . Cancer Father   . Colon cancer Neg Hx   . Colon polyps Neg Hx     Social History   Socioeconomic History  . Marital status: Married    Spouse name: Not on file  . Number of children: Not on file  . Years of education: Not on file  . Highest education level: Not on file  Occupational History  . Not on file  Tobacco Use  . Smoking status: Current Every Day Smoker    Packs/day: 1.00    Years: 31.00    Pack years: 31.00    Types: Cigarettes  . Smokeless tobacco: Never Used  Vaping Use  . Vaping Use: Never used  Substance and Sexual Activity  . Alcohol use: No    Alcohol/week: 0.0 standard drinks    Comment: no longer  . Drug use: No  . Sexual activity: Not on file  Other Topics Concern  . Not on file  Social History Narrative  . Not on file   Social Determinants of Health   Financial Resource Strain:   . Difficulty of Paying Living Expenses: Not on file  Food Insecurity:   . Worried About Programme researcher, broadcasting/film/video in the Last Year: Not on file  . Ran Out of Food in the Last Year: Not on file  Transportation Needs:   . Lack of Transportation (Medical): Not on file  . Lack of Transportation (Non-Medical): Not on file  Physical Activity:   . Days of Exercise per Week: Not on file  . Minutes of Exercise per Session: Not on file  Stress:   . Feeling of Stress : Not on file  Social Connections:   . Frequency of Communication with Friends and Family: Not on file  . Frequency of Social Gatherings with Friends and Family: Not on file  . Attends Religious Services: Not on file  . Active Member of Clubs or Organizations: Not on  file  . Attends Banker Meetings: Not on file  . Marital Status: Not on file       Review of Systems: Gen: see HPI CV: Denies chest pain, palpitations, syncope, peripheral edema, and claudication. Resp: see HPI GI: see HPI Derm: Denies rash, itching, dry skin Psych: Denies depression, anxiety, memory loss, confusion. No homicidal or suicidal ideation.  Heme: Denies bruising, bleeding, and enlarged lymph nodes.  Observations/Objective: No distress. Unable to perform physical exam due to telephone encounter. No video available.   Assessment and Plan: 49 year old male presenting via telephone visit today, as he declined being seen in the office due to mask policy. He has been adamant about not wearing a mask at time of procedure, but I discussed at length with him that during pre-op and post-procedure, he would be required to wear a mask. I am unsure if he will follow through with this at this time.  Chronic dysphagia noted, seeing multiple GI practices historically and not physically seen by [place] since 2019. In setting of COPD, pulmonary clearance received May 2021. He requires 2 liters O2 at night and intermittently during the day. No prior EGD. Continues with PPI daily.  Remote history of rectal bleeding reported but none currently. Linzess doing well for constipation. Will pursue colonoscopy in the near future at time of EGD.  Proceed with colonoscopy/EGD/dilation by Dr. Marletta Lor  in near future: the risks, benefits, and alternatives have been discussed with the patient in detail. The patient states understanding and desires to proceed.  Continue Linzess 290 mcg daily Continue omepazole 40 mg daily Call if any changes in baseline respiratory status prior to procedure.     Follow Up Instructions:    I discussed the assessment and treatment plan with the patient. The patient was provided an opportunity to ask questions and all were answered. The patient agreed with the  plan and demonstrated an understanding of the instructions.   The patient was advised to call back or seek an in-person evaluation if the symptoms worsen or if the condition fails to improve as anticipated.  I provided 15 minutes of non-face-to-face time during this encounter.  Gelene Mink, PhD, ANP-BC [facility]

## 2020-02-02 ENCOUNTER — Telehealth: Payer: Self-pay | Admitting: *Deleted

## 2020-02-02 NOTE — Telephone Encounter (Signed)
Called pt and spoke with spouse. Patient scheduled for TCS/EGD/DIL with propofol with Dr. Marletta Lor, asa 4, on 11/9 at 7:30am. Aware pt will need pre-op/covid test appt. Advised will mail with prep instructions. Confirmed mailing address.

## 2020-02-03 ENCOUNTER — Encounter: Payer: Self-pay | Admitting: *Deleted

## 2020-02-04 ENCOUNTER — Telehealth: Payer: Self-pay | Admitting: *Deleted

## 2020-02-04 NOTE — Telephone Encounter (Signed)
PA approved via [facility] website. Authorization #: [id number] dates: 04/04/2020-05/26/2020

## 2020-03-04 ENCOUNTER — Encounter (HOSPITAL_COMMUNITY): Payer: Self-pay | Admitting: Emergency Medicine

## 2020-03-04 ENCOUNTER — Emergency Department (HOSPITAL_COMMUNITY): Payer: Medicare Other

## 2020-03-04 ENCOUNTER — Other Ambulatory Visit: Payer: Self-pay

## 2020-03-04 ENCOUNTER — Emergency Department (HOSPITAL_COMMUNITY)
Admission: EM | Admit: 2020-03-04 | Discharge: 2020-03-05 | Disposition: A | Discharge status: Home / Self Care | Payer: Medicare Other | Attending: Emergency Medicine | Admitting: Emergency Medicine

## 2020-03-04 DIAGNOSIS — Z20822 Contact with and (suspected) exposure to covid-19: Secondary | ICD-10-CM | POA: Insufficient documentation

## 2020-03-04 DIAGNOSIS — Z79899 Other long term (current) drug therapy: Secondary | ICD-10-CM | POA: Insufficient documentation

## 2020-03-04 DIAGNOSIS — Z7982 Long term (current) use of aspirin: Secondary | ICD-10-CM | POA: Diagnosis not present

## 2020-03-04 DIAGNOSIS — F17210 Nicotine dependence, cigarettes, uncomplicated: Secondary | ICD-10-CM | POA: Insufficient documentation

## 2020-03-04 DIAGNOSIS — Z7951 Long term (current) use of inhaled steroids: Secondary | ICD-10-CM | POA: Diagnosis not present

## 2020-03-04 DIAGNOSIS — R0602 Shortness of breath: Secondary | ICD-10-CM | POA: Diagnosis present

## 2020-03-04 DIAGNOSIS — J441 Chronic obstructive pulmonary disease with (acute) exacerbation: Secondary | ICD-10-CM | POA: Diagnosis not present

## 2020-03-04 MED ORDER — ALBUTEROL (5 MG/ML) CONTINUOUS INHALATION SOLN
10.0000 mg/h | INHALATION_SOLUTION | Freq: Once | RESPIRATORY_TRACT | Status: AC
  Administered 2020-03-04: 10 mg/h via RESPIRATORY_TRACT
  Filled 2020-03-04: qty 20

## 2020-03-04 MED ORDER — METHYLPREDNISOLONE SODIUM SUCC 125 MG IJ SOLR
125.0000 mg | Freq: Once | INTRAMUSCULAR | Status: AC
  Administered 2020-03-05: 125 mg via INTRAVENOUS
  Filled 2020-03-04: qty 2

## 2020-03-04 MED ORDER — IPRATROPIUM BROMIDE 0.02 % IN SOLN
0.5000 mg | Freq: Once | RESPIRATORY_TRACT | Status: AC
  Administered 2020-03-04: 0.5 mg via RESPIRATORY_TRACT
  Filled 2020-03-04: qty 2.5

## 2020-03-04 NOTE — ED Triage Notes (Signed)
Pt from home via [facility]. Pt reports SOB x 2 days. Pt on home O2 at 2 liters chronically. Pt 98% at this time.

## 2020-03-05 DIAGNOSIS — J441 Chronic obstructive pulmonary disease with (acute) exacerbation: Secondary | ICD-10-CM | POA: Diagnosis not present

## 2020-03-05 LAB — CBC WITH DIFFERENTIAL/PLATELET
Abs Immature Granulocytes: 0.35 10*3/uL — ABNORMAL HIGH (ref 0.00–0.07)
Basophils Absolute: 0.1 10*3/uL (ref 0.0–0.1)
Basophils Relative: 0 %
Eosinophils Absolute: 0.1 10*3/uL (ref 0.0–0.5)
Eosinophils Relative: 1 %
HCT: 39.2 % (ref 39.0–52.0)
Hemoglobin: 12.7 g/dL — ABNORMAL LOW (ref 13.0–17.0)
Immature Granulocytes: 3 %
Lymphocytes Relative: 27 %
Lymphs Abs: 3.5 10*3/uL (ref 0.7–4.0)
MCH: 30.5 pg (ref 26.0–34.0)
MCHC: 32.4 g/dL (ref 30.0–36.0)
MCV: 94.2 fL (ref 80.0–100.0)
Monocytes Absolute: 1.1 10*3/uL — ABNORMAL HIGH (ref 0.1–1.0)
Monocytes Relative: 8 %
Neutro Abs: 7.7 10*3/uL (ref 1.7–7.7)
Neutrophils Relative %: 61 %
Platelets: 206 10*3/uL (ref 150–400)
RBC: 4.16 MIL/uL — ABNORMAL LOW (ref 4.22–5.81)
RDW: 14.5 % (ref 11.5–15.5)
WBC: 12.7 10*3/uL — ABNORMAL HIGH (ref 4.0–10.5)
nRBC: 0 % (ref 0.0–0.2)

## 2020-03-05 LAB — BLOOD GAS, ARTERIAL
Acid-Base Excess: 8.2 mmol/L — ABNORMAL HIGH (ref 0.0–2.0)
Bicarbonate: 31.4 mmol/L — ABNORMAL HIGH (ref 20.0–28.0)
FIO2: 40
O2 Saturation: 95.8 %
Patient temperature: 37
pCO2 arterial: 46.1 mmHg (ref 32.0–48.0)
pH, Arterial: 7.459 — ABNORMAL HIGH (ref 7.350–7.450)
pO2, Arterial: 80 mmHg — ABNORMAL LOW (ref 83.0–108.0)

## 2020-03-05 LAB — BASIC METABOLIC PANEL
Anion gap: 10 (ref 5–15)
BUN: 20 mg/dL (ref 6–20)
CO2: 30 mmol/L (ref 22–32)
Calcium: 9.2 mg/dL (ref 8.9–10.3)
Chloride: 95 mmol/L — ABNORMAL LOW (ref 98–111)
Creatinine, Ser: 0.59 mg/dL — ABNORMAL LOW (ref 0.61–1.24)
GFR, Estimated: >60 mL/min (ref >60)
Glucose, Bld: 116 mg/dL — ABNORMAL HIGH (ref 70–99)
Potassium: 3.1 mmol/L — ABNORMAL LOW (ref 3.5–5.1)
Sodium: 135 mmol/L (ref 135–145)

## 2020-03-05 LAB — RESPIRATORY PANEL BY RT PCR (FLU A&B, COVID)
Influenza A by PCR: NEGATIVE
Influenza B by PCR: NEGATIVE
SARS Coronavirus 2 by RT PCR: NEGATIVE

## 2020-03-05 LAB — BRAIN NATRIURETIC PEPTIDE: B Natriuretic Peptide: 88 pg/mL (ref 0.0–100.0)

## 2020-03-05 MED ORDER — ALBUTEROL SULFATE (2.5 MG/3ML) 0.083% IN NEBU
5.0000 mg | INHALATION_SOLUTION | Freq: Once | RESPIRATORY_TRACT | Status: AC
  Administered 2020-03-05: 5 mg via RESPIRATORY_TRACT
  Filled 2020-03-05: qty 6

## 2020-03-05 MED ORDER — PREDNISONE 50 MG PO TABS: ORAL_TABLET | ORAL | 0 refills | Status: DC

## 2020-03-05 NOTE — ED Provider Notes (Signed)
[facility] EMERGENCY DEPARTMENT Provider Note   CSN: [id number] Arrival date & time: 03/04/20  2220     History Chief Complaint  Patient presents with  . Shortness of Breath   Level 5 caveat due to acuity of condition Manuel Richard is a 49 y.o. male.  The history is provided by the patient. The history is limited by the condition of the patient.  Shortness of Breath Severity:  Moderate Onset quality:  Gradual Duration:  2 days Timing:  Constant Progression:  Worsening Chronicity:  Recurrent Relieved by:  Nothing Worsened by:  Nothing Associated symptoms: cough and wheezing   Associated symptoms: no fever    Patient with history of chronic pain, COPD on home oxygen presents with increasing shortness of breath for the past 2 days.  Patient reports he needs a "shot: to help open up his lungs.  He was recently seen in outside hospital for similar symptoms and feels worse.    Past Medical History:  Diagnosis Date  . Anemia   . Chronic back pain   . Chronic pain syndrome   . Chronic pain syndrome   . Chronic sinusitis   . COPD (chronic obstructive pulmonary disease) (HCC)   . Dyslipidemia   . Dysphagia   . Epididymitis    Chlamydial, Right  . Erectile dysfunction   . Fatigue   . GERD (gastroesophageal reflux disease)   . Groin pain   . High cholesterol   . Hydrocele    bilateral  . Lumbar radiculopathy   . Nicotine addiction   . Rectal bleeding   . Shortness of breath dyspnea   . Stroke (HCC) 2006   x 2     . Testicular pain     Patient Active Problem List   Diagnosis Date Noted  . GERD (gastroesophageal reflux disease)   . Anemia 04/20/2018  . Chronic pain syndrome 04/20/2018  . Dyslipidemia 04/20/2018  . Vitamin D deficiency 04/20/2018  . Chronic low back pain (Primary Area of Pain) (Bilateral) (L>R) w/ sciatica (Left) 04/20/2018  . Chronic lower extremity pain (Secondary Area of Pain) (Left) 04/20/2018  . Pharmacologic therapy 04/20/2018  .  Disorder of skeletal system 04/20/2018  . Problems influencing health status 04/20/2018  . Long term current use of opiate analgesic 04/20/2018  . 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 Rectal bleeding 03/17/2018  . Dysphagia 03/17/2018  . Chronic low back pain (Midline) w/o sciatica 05/31/2015  . Back pain at L4-L5 level 02/15/2015  . DDD (degenerative disc disease), lumbosacral 02/15/2015  . Lumbar radiculopathy 02/15/2015  . Drug-seeking behavior 01/12/2015  . Bilateral hydrocele 01/12/2015  . Bilateral varicoceles 01/12/2015  . Orchitis and epididymitis 12/16/2014  . Epididymitis 11/29/2014  . Microscopic hematuria 11/29/2014    Past Surgical History:  Procedure Laterality Date  . APPENDECTOMY         Family History  Problem Relation Age of Onset  . Cancer Mother   . Cancer Father   . Colon cancer Neg Hx   . Colon polyps Neg Hx     Social History   Tobacco Use  . Smoking status: Current Every Day Smoker    Packs/day: 1.00    Years: 31.00    Pack years: 31.00    Types: Cigarettes  . Smokeless tobacco: Never Used  Vaping Use  . Vaping Use: Never used  Substance Use Topics  . Alcohol use: No    Alcohol/week: 0.0 standard drinks    Comment: no longer  .  Drug use: No    Home Medications Prior to Admission medications   Medication Sig Start Date End Date Taking? Authorizing Provider  albuterol (PROVENTIL HFA;VENTOLIN HFA) 108 (90 Base) MCG/ACT inhaler Inhale 2 puffs into the lungs every 6 (six) hours as needed for wheezing or shortness of breath.    [provider]  aspirin EC 81 MG tablet Take 81 mg by mouth daily.    [provider]  atorvastatin (LIPITOR) 40 MG tablet Take 40 mg by mouth daily at 6 PM.  08/16/18   [provider]  buPROPion (WELLBUTRIN XL) 150 MG 24 hr tablet Take 150 mg by mouth daily.    [provider]  cyclobenzaprine (FLEXERIL) 10 MG tablet Take 1 tablet by mouth every 12 (twelve) hours.  06/15/19   [provider]  ezetimibe (ZETIA) 10 MG tablet Take 10 mg by mouth at bedtime. 03/17/19   [provider]  Fluticasone-Salmeterol (ADVAIR DISKUS) 250-50 MCG/DOSE AEPB Inhale 1 puff into the lungs in the morning and at bedtime. 10/24/19   Burgess AmorIdol, Julie, PA-C  furosemide (LASIX) 20 MG tablet Take 20 mg by mouth daily.  12/24/17   [provider]  gabapentin (NEURONTIN) 600 MG tablet Take 600 mg by mouth 2 (two) times daily. 04/11/19   [provider]  HYDROcodone-acetaminophen (NORCO) 10-325 MG tablet Take 1 tablet by mouth every 4 (four) hours as needed for pain. 07/02/19   [provider]  ipratropium-albuterol (DUONEB) 0.5-2.5 (3) MG/3ML SOLN Take 3 mLs by nebulization 3 (three) times daily.    [provider]  LINZESS 290 MCG CAPS capsule Take 290 mcg by mouth daily. 05/04/19   [provider]  omeprazole (PRILOSEC) 40 MG capsule Take 40 mg by mouth daily. 01/11/20   [provider]  sildenafil (REVATIO) 20 MG tablet Take 5 tablets by mouth as needed. 02/22/18   [provider]  STIOLTO RESPIMAT 2.5-2.5 MCG/ACT AERS Inhale 2 puffs into the lungs daily. 07/05/18   [provider]  Vitamin D, Ergocalciferol, (DRISDOL) 50000 UNITS CAPS capsule every 7 (seven) days.  12/23/14   [provider]    Allergies    Patient has no known allergies.  Review of Systems   Review of Systems  Unable to perform ROS: Acuity of condition  Constitutional: Negative for fever.  Respiratory: Positive for cough, shortness of breath and wheezing.     Physical Exam Updated Vital Signs BP 130/89   Pulse 86   Temp 98.4 F (36.9 C) (Oral)   Resp 16   SpO2 100%   Physical Exam CONSTITUTIONAL: Ill-appearing, appears older than stated age HEAD: Normocephalic/atraumatic EYES: EOMI/PERRL ENMT: Mucous membranes moist, no stridor NECK: supple no meningeal signs SPINE/BACK:entire spine nontender CV: S1/S2 noted, no  murmurs/rubs/gallops noted LUNGS: Tachypnea, wheezing bilaterally, on 3 L nasal cannula ABDOMEN: soft, nontender, no rebound or guarding, bowel sounds noted throughout abdomen GU:no cva tenderness NEURO: Pt is awake/alert/appropriate, moves all extremitiesx4.  No facial droop.   EXTREMITIES: pulses normal/equal, full ROM, lower extremity edema noted SKIN: warm, color normal PSYCH: unable to assess  ED Results / Procedures / Treatments   Labs (all labs ordered are listed, but only abnormal results are displayed) Labs Reviewed  BASIC METABOLIC PANEL - Abnormal; Notable for the following components:      Result Value   Potassium 3.1 (*)    Chloride 95 (*)    Glucose, Bld 116 (*)    Creatinine, Ser 0.59 (*)    All  other components within normal limits  CBC WITH DIFFERENTIAL/PLATELET - Abnormal; Notable for the following components:   WBC 12.7 (*)    RBC 4.16 (*)    Hemoglobin 12.7 (*)    Monocytes Absolute 1.1 (*)    Abs Immature Granulocytes 0.35 (*)    All other components within normal limits  BLOOD GAS, ARTERIAL - Abnormal; Notable for the following components:   pH, Arterial 7.459 (*)    pO2, Arterial 80.0 (*)    Bicarbonate 31.4 (*)    Acid-Base Excess 8.2 (*)    All other components within normal limits  RESPIRATORY PANEL BY RT PCR (FLU A&B, COVID)  BRAIN NATRIURETIC PEPTIDE    EKG EKG Interpretation  Date/Time:  Saturday March 04 2020 23:25:46 EDT Ventricular Rate:  99 PR Interval:    QRS Duration: 95 QT Interval:  344 QTC Calculation: 442 R Axis:   50 Text Interpretation: Sinus rhythm Interpretation limited secondary to artifact Confirmed by Anthoney, Sheppard ([id number]) on 03/04/2020 11:33:15 PM   Radiology DG Chest Port 1 View  Result Date: 03/04/2020 CLINICAL DATA:  49 year old male with shortness of breath. EXAM: PORTABLE CHEST 1 VIEW COMPARISON:  Chest radiograph dated 03/03/2020. FINDINGS: Shallow inspiration. No focal consolidation, pleural effusion, or  pneumothorax. The cardiac silhouette is within limits. No acute osseous pathology. IMPRESSION: No active disease. Electronically Signed   By: Elgie Collard M.D.   On: 03/04/2020 23:48    Procedures .Critical Care Performed by: Zadie Rhine, MD Authorized by: Zadie Rhine, MD   Critical care provider statement:    Critical care time (minutes):  63   Critical care start time:  03/05/2020 12:57 AM   Critical care end time:  03/05/2020 2:00 AM   Critical care time was exclusive of:  Separately billable procedures and treating other patients   Critical care was necessary to treat or prevent imminent or life-threatening deterioration of the following conditions:  Respiratory failure   Critical care was time spent personally by me on the following activities:  Pulse oximetry, re-evaluation of patient's condition, review of old charts, ordering and review of radiographic studies, ordering and performing treatments and interventions, ordering and review of laboratory studies, discussions with consultants, evaluation of patient's response to treatment, examination of patient, obtaining history from patient or surrogate and development of treatment plan with patient or surrogate   I assumed direction of critical care for this patient from another provider in my specialty: no      Medications Ordered in ED Medications  methylPREDNISolone sodium succinate (SOLU-MEDROL) 125 mg/2 mL injection 125 mg (125 mg Intravenous Given 03/05/20 0017)  albuterol (PROVENTIL,VENTOLIN) solution continuous neb (10 mg/hr Nebulization Given 03/04/20 2351)  ipratropium (ATROVENT) nebulizer solution 0.5 mg (0.5 mg Nebulization Given 03/04/20 2351)  albuterol (PROVENTIL) (2.5 MG/3ML) 0.083% nebulizer solution 5 mg (5 mg Nebulization Given 03/05/20 0231)    ED Course  I have reviewed the triage vital signs and the nursing notes.  Pertinent labs & imaging results that were available during my care of the patient  were reviewed by me and considered in my medical decision making (see chart for details).    MDM Rules/Calculators/A&P                           12:57 AM Patient very poor historian presented what appears to be a COPD exacerbation.  Chest x-ray is clear. Plan to treat wheezing and reassess. I have reviewed records from recent evaluation at  outside hospital 3:45 AM Patient monitored for several hours and given steroids as well as multiple nebulizers. No signs of hypercapnia.  Overall patient appears improved.  No hypoxia while standing or walking.  He has oxygen at home.  No signs of CHF.  Feel he is safe for discharge home.  He will be prescribed prednisone.  He already has nebulizers at home.  We discussed return precautions    This patient presents to the ED for concern of shortness of breath, this involves an extensive number of treatment options, and is a complaint that carries with it a high risk of complications and morbidity.  The differential diagnosis includes COPD, CHF, acute current syndrome, pulmonary embolism, pneumonia, COVID-19   Lab Tests:   I Ordered, reviewed, and interpreted labs, which included electrolytes, BNP, complete blood  Medicines ordered:   I ordered medication albuterol and Solu-Medrol for shortness of breath  Imaging Studies ordered:   I ordered imaging studies which included chest x-ray   I independently visualized and interpreted imaging which showed no acute findings  Additional history obtained:    Previous records obtained and reviewed     Reevaluation:  After the interventions stated above, I reevaluated the patient and found pt is improved  Critical Interventions:  . Nebulizer treatments  Final Clinical Impression(s) / ED Diagnoses Final diagnoses:  COPD exacerbation (HCC)    Rx / DC Orders ED Discharge Orders         Ordered    predniSONE (DELTASONE) 50 MG tablet        03/05/20 0340           Zadie Rhine,  MD 03/05/20 0345

## 2020-03-05 NOTE — ED Notes (Addendum)
Pt stood up and ambulated around room- slow steady gait. O2 sats 95%

## 2020-03-10 ENCOUNTER — Other Ambulatory Visit: Payer: Self-pay

## 2020-03-10 ENCOUNTER — Emergency Department (HOSPITAL_COMMUNITY)
Admission: EM | Admit: 2020-03-10 | Discharge: 2020-03-11 | Disposition: A | Discharge status: Home / Self Care | Payer: Medicare Other | Attending: Emergency Medicine | Admitting: Emergency Medicine

## 2020-03-10 ENCOUNTER — Encounter (HOSPITAL_COMMUNITY): Payer: Self-pay | Admitting: Emergency Medicine

## 2020-03-10 ENCOUNTER — Emergency Department (HOSPITAL_COMMUNITY): Payer: Medicare Other

## 2020-03-10 DIAGNOSIS — R600 Localized edema: Secondary | ICD-10-CM | POA: Insufficient documentation

## 2020-03-10 DIAGNOSIS — Z7982 Long term (current) use of aspirin: Secondary | ICD-10-CM | POA: Diagnosis not present

## 2020-03-10 DIAGNOSIS — J441 Chronic obstructive pulmonary disease with (acute) exacerbation: Secondary | ICD-10-CM

## 2020-03-10 DIAGNOSIS — F17210 Nicotine dependence, cigarettes, uncomplicated: Secondary | ICD-10-CM | POA: Insufficient documentation

## 2020-03-10 DIAGNOSIS — I509 Heart failure, unspecified: Secondary | ICD-10-CM | POA: Diagnosis not present

## 2020-03-10 DIAGNOSIS — Z20822 Contact with and (suspected) exposure to covid-19: Secondary | ICD-10-CM | POA: Diagnosis not present

## 2020-03-10 DIAGNOSIS — R0602 Shortness of breath: Secondary | ICD-10-CM | POA: Diagnosis present

## 2020-03-10 HISTORY — DX: Heart failure, unspecified: I50.9

## 2020-03-10 LAB — CBC
HCT: 39.6 % (ref 39.0–52.0)
Hemoglobin: 13.3 g/dL (ref 13.0–17.0)
MCH: 31 pg (ref 26.0–34.0)
MCHC: 33.6 g/dL (ref 30.0–36.0)
MCV: 92.3 fL (ref 80.0–100.0)
Platelets: 316 10*3/uL (ref 150–400)
RBC: 4.29 MIL/uL (ref 4.22–5.81)
RDW: 14.1 % (ref 11.5–15.5)
WBC: 24.8 10*3/uL — ABNORMAL HIGH (ref 4.0–10.5)
nRBC: 0 % (ref 0.0–0.2)

## 2020-03-10 LAB — RESPIRATORY PANEL BY RT PCR (FLU A&B, COVID)
Influenza A by PCR: NEGATIVE
Influenza B by PCR: NEGATIVE
SARS Coronavirus 2 by RT PCR: NEGATIVE

## 2020-03-10 LAB — BASIC METABOLIC PANEL
Anion gap: 13 (ref 5–15)
BUN: 15 mg/dL (ref 6–20)
CO2: 27 mmol/L (ref 22–32)
Calcium: 9.4 mg/dL (ref 8.9–10.3)
Chloride: 96 mmol/L — ABNORMAL LOW (ref 98–111)
Creatinine, Ser: 0.65 mg/dL (ref 0.61–1.24)
GFR, Estimated: >60 mL/min (ref >60)
Glucose, Bld: 145 mg/dL — ABNORMAL HIGH (ref 70–99)
Potassium: 3.2 mmol/L — ABNORMAL LOW (ref 3.5–5.1)
Sodium: 136 mmol/L (ref 135–145)

## 2020-03-10 LAB — BRAIN NATRIURETIC PEPTIDE: B Natriuretic Peptide: 69 pg/mL (ref 0.0–100.0)

## 2020-03-10 LAB — TROPONIN I (HIGH SENSITIVITY): Troponin I (High Sensitivity): 10 ng/L (ref <18)

## 2020-03-10 MED ORDER — ALBUTEROL SULFATE HFA 108 (90 BASE) MCG/ACT IN AERS: 6.0000 | INHALATION_SPRAY | Freq: Once | RESPIRATORY_TRACT | Status: DC

## 2020-03-10 MED ORDER — IPRATROPIUM-ALBUTEROL 20-100 MCG/ACT IN AERS: 2.0000 | INHALATION_SPRAY | Freq: Once | RESPIRATORY_TRACT | Status: DC

## 2020-03-10 MED ORDER — POTASSIUM CHLORIDE CRYS ER 20 MEQ PO TBCR
40.0000 meq | EXTENDED_RELEASE_TABLET | Freq: Once | ORAL | Status: AC
  Administered 2020-03-10: 40 meq via ORAL
  Filled 2020-03-10: qty 2

## 2020-03-10 MED ORDER — MAGNESIUM SULFATE 2 GM/50ML IV SOLN
2.0000 g | Freq: Once | INTRAVENOUS | Status: AC
  Administered 2020-03-10: 2 g via INTRAVENOUS
  Filled 2020-03-10: qty 50

## 2020-03-10 MED ORDER — METHYLPREDNISOLONE SODIUM SUCC 125 MG IJ SOLR
125.0000 mg | Freq: Once | INTRAMUSCULAR | Status: AC
  Administered 2020-03-10: 125 mg via INTRAVENOUS
  Filled 2020-03-10: qty 2

## 2020-03-10 MED ORDER — IPRATROPIUM BROMIDE 0.02 % IN SOLN
0.5000 mg | Freq: Once | RESPIRATORY_TRACT | Status: AC
  Administered 2020-03-10: 0.5 mg via RESPIRATORY_TRACT
  Filled 2020-03-10: qty 2.5

## 2020-03-10 MED ORDER — ALBUTEROL (5 MG/ML) CONTINUOUS INHALATION SOLN
10.0000 mg/h | INHALATION_SOLUTION | Freq: Once | RESPIRATORY_TRACT | Status: AC
  Administered 2020-03-10: 10 mg/h via RESPIRATORY_TRACT
  Filled 2020-03-10: qty 20

## 2020-03-10 NOTE — ED Triage Notes (Signed)
Patient brought in by EMS for shortness of breath. Patient refused to be placed on C-pap. Patient is on 2 liters of oxygen at home with a history of COPD and CHF history. Patient states no pain but breathing became worse over the last few days.

## 2020-03-10 NOTE — ED Provider Notes (Signed)
[facility] EMERGENCY DEPARTMENT Provider Note   CSN: [id number] Arrival date & time: 03/10/20  2046     History Chief Complaint  Patient presents with  . Shortness of Breath    Manuel Richard is a 49 y.o. male.  HPI 49 year old male presents with shortness of breath.  He states that started this morning and is a recurrent issue.  Feels like a recurrent COPD exacerbation.  The patient denies significant cough or fever.  He has been having chest pain for a long time that he states is an esophagus issue.  However his chest also feels tight.  He has chronic leg edema that is a little worse.  He was recently put on steroids after being seen in the emergency department several days ago and finished yesterday.  He states his oxygen was around 85% despite being on his 2 L at home. Up to 5L with EMS.  Past Medical History:  Diagnosis Date  . Anemia   . CHF (congestive heart failure) (HCC)   . Chronic back pain   . Chronic pain syndrome   . Chronic pain syndrome   . Chronic sinusitis   . COPD (chronic obstructive pulmonary disease) (HCC)   . Dyslipidemia   . Dysphagia   . Epididymitis    Chlamydial, Right  . Erectile dysfunction   . Fatigue   . GERD (gastroesophageal reflux disease)   . Groin pain   . High cholesterol   . Hydrocele    bilateral  . Lumbar radiculopathy   . Nicotine addiction   . Rectal bleeding   . Shortness of breath dyspnea   . Stroke (HCC) 2006   x 2     . Testicular pain     Patient Active Problem List   Diagnosis Date Noted  . GERD (gastroesophageal reflux disease)   . Anemia 04/20/2018  . Chronic pain syndrome 04/20/2018  . Dyslipidemia 04/20/2018  . Vitamin D deficiency 04/20/2018  . Chronic low back pain (Primary Area of Pain) (Bilateral) (L>R) w/ sciatica (Left) 04/20/2018  . Chronic lower extremity pain (Secondary Area of Pain) (Left) 04/20/2018  . Pharmacologic therapy 04/20/2018  . Disorder of skeletal system 04/20/2018  . Problems  influencing health status 04/20/2018  . Long term current use of opiate analgesic 04/20/2018  . 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 Rectal bleeding 03/17/2018  . Dysphagia 03/17/2018  . Chronic low back pain (Midline) w/o sciatica 05/31/2015  . Back pain at L4-L5 level 02/15/2015  . DDD (degenerative disc disease), lumbosacral 02/15/2015  . Lumbar radiculopathy 02/15/2015  . Drug-seeking behavior 01/12/2015  . Bilateral hydrocele 01/12/2015  . Bilateral varicoceles 01/12/2015  . Orchitis and epididymitis 12/16/2014  . Epididymitis 11/29/2014  . Microscopic hematuria 11/29/2014    Past Surgical History:  Procedure Laterality Date  . APPENDECTOMY         Family History  Problem Relation Age of Onset  . Cancer Mother   . Cancer Father   . Colon cancer Neg Hx   . Colon polyps Neg Hx     Social History   Tobacco Use  . Smoking status: Current Every Day Smoker    Packs/day: 1.00    Years: 31.00    Pack years: 31.00    Types: Cigarettes  . Smokeless tobacco: Never Used  Vaping Use  . Vaping Use: Never used  Substance Use Topics  . Alcohol use: No    Alcohol/week: 0.0 standard drinks    Comment: no longer  .  Drug use: No    Home Medications Prior to Admission medications   Medication Sig Start Date End Date Taking? Authorizing Provider  albuterol (PROVENTIL HFA;VENTOLIN HFA) 108 (90 Base) MCG/ACT inhaler Inhale 2 puffs into the lungs every 6 (six) hours as needed for wheezing or shortness of breath.    [provider]  aspirin EC 81 MG tablet Take 81 mg by mouth daily.    [provider]  atorvastatin (LIPITOR) 40 MG tablet Take 40 mg by mouth daily at 6 PM.  08/16/18   [provider]  buPROPion (WELLBUTRIN XL) 150 MG 24 hr tablet Take 150 mg by mouth daily.    [provider]  cyclobenzaprine (FLEXERIL) 10 MG tablet Take 1 tablet by mouth every 12 (twelve) hours. 06/15/19   [provider]  ezetimibe (ZETIA)  10 MG tablet Take 10 mg by mouth at bedtime. 03/17/19   [provider]  Fluticasone-Salmeterol (ADVAIR DISKUS) 250-50 MCG/DOSE AEPB Inhale 1 puff into the lungs in the morning and at bedtime. 10/24/19   Burgess Amor, PA-C  furosemide (LASIX) 20 MG tablet Take 20 mg by mouth daily.  12/24/17   [provider]  gabapentin (NEURONTIN) 600 MG tablet Take 600 mg by mouth 2 (two) times daily. 04/11/19   [provider]  HYDROcodone-acetaminophen (NORCO) 10-325 MG tablet Take 1 tablet by mouth every 4 (four) hours as needed for pain. 07/02/19   [provider]  ipratropium-albuterol (DUONEB) 0.5-2.5 (3) MG/3ML SOLN Take 3 mLs by nebulization 3 (three) times daily.    [provider]  LINZESS 290 MCG CAPS capsule Take 290 mcg by mouth daily. 05/04/19   [provider]  omeprazole (PRILOSEC) 40 MG capsule Take 40 mg by mouth daily. 01/11/20   [provider]  predniSONE (DELTASONE) 50 MG tablet 1 tablet PO QD X4 days 03/05/20   Zadie Rhine, MD  sildenafil (REVATIO) 20 MG tablet Take 5 tablets by mouth as needed. 02/22/18   [provider]  STIOLTO RESPIMAT 2.5-2.5 MCG/ACT AERS Inhale 2 puffs into the lungs daily. 07/05/18   [provider]  Vitamin D, Ergocalciferol, (DRISDOL) 50000 UNITS CAPS capsule every 7 (seven) days.  12/23/14   [provider]    Allergies    Patient has no known allergies.  Review of Systems   Review of Systems  Constitutional: Negative for fever.  Respiratory: Positive for shortness of breath. Negative for cough.   Cardiovascular: Positive for chest pain and leg swelling.  Gastrointestinal: Negative for vomiting.  All other systems reviewed and are negative.   Physical Exam Updated Vital Signs BP (!) 164/77 (BP Location: Left Arm)   Pulse (!) 117   Temp 98.1 F (36.7 C) (Oral)   Resp 20   Ht 6' (1.829 m)   Wt 110.2 kg   SpO2 92%   BMI 32.96 kg/m   Physical Exam Vitals and  nursing note reviewed.  Constitutional:      Appearance: He is well-developed. He is obese.  HENT:     Head: Normocephalic and atraumatic.     Right Ear: External ear normal.     Left Ear: External ear normal.     Nose: Nose normal.  Eyes:     General:        Right eye: No discharge.        Left eye: No discharge.  Cardiovascular:     Rate and Rhythm: Regular rhythm. Tachycardia present.     Heart sounds:  Normal heart sounds.  Pulmonary:     Effort: Pulmonary effort is normal. Tachypnea present. No accessory muscle usage or respiratory distress.     Breath sounds: Wheezing (diffuse, expiratory) present.  Abdominal:     Palpations: Abdomen is soft.     Tenderness: There is no abdominal tenderness.  Musculoskeletal:     Cervical back: Neck supple.     Right lower leg: Edema present.     Left lower leg: Edema present.  Skin:    General: Skin is warm and dry.  Neurological:     Mental Status: He is alert.  Psychiatric:        Mood and Affect: Mood is not anxious.     ED Results / Procedures / Treatments   Labs (all labs ordered are listed, but only abnormal results are displayed) Labs Reviewed  CBC - Abnormal; Notable for the following components:      Result Value   WBC 24.8 (*)    All other components within normal limits  BASIC METABOLIC PANEL - Abnormal; Notable for the following components:   Potassium 3.2 (*)    Chloride 96 (*)    Glucose, Bld 145 (*)    All other components within normal limits  RESPIRATORY PANEL BY RT PCR (FLU A&B, COVID)  BRAIN NATRIURETIC PEPTIDE  TROPONIN I (HIGH SENSITIVITY)  TROPONIN I (HIGH SENSITIVITY)    EKG None  Radiology DG Chest Portable 1 View  Result Date: 03/10/2020 CLINICAL DATA:  Patient brought in by EMS for shortness of breath. Patient refused to be placed on C-pap. Patient is on 2 liters of oxygen at home with a history of COPD and CHF history. Patient states no pain but breathing became worse over the last few days.  EXAM: PORTABLE CHEST 1 VIEW COMPARISON:  03/04/2020 and older studies. FINDINGS: Cardiac silhouette is normal in size. No mediastinal or hilar masses. Clear lungs.  No convincing pleural effusion.  No pneumothorax. Skeletal structures are demineralized but grossly intact. IMPRESSION: No active disease. Electronically Signed   By: Amie Portland M.D.   On: 03/10/2020 21:40    Procedures Procedures (including critical care time)  Medications Ordered in ED Medications  methylPREDNISolone sodium succinate (SOLU-MEDROL) 125 mg/2 mL injection 125 mg (125 mg Intravenous Given 03/10/20 2156)  magnesium sulfate IVPB 2 g 50 mL (2 g Intravenous New Bag/Given 03/10/20 2309)  albuterol (PROVENTIL,VENTOLIN) solution continuous neb (10 mg/hr Nebulization Given 03/10/20 2235)  ipratropium (ATROVENT) nebulizer solution 0.5 mg (0.5 mg Nebulization Given 03/10/20 2236)  potassium chloride SA (KLOR-CON) CR tablet 40 mEq (40 mEq Oral Given 03/10/20 2309)    ED Course  I have reviewed the triage vital signs and the nursing notes.  Pertinent labs & imaging results that were available during my care of the patient were reviewed by me and considered in my medical decision making (see chart for details).    MDM Rules/Calculators/A&P                          Patient appears to have a COPD exacerbation.  He was given continuous albuterol nebulizer as well as Solu-Medrol and magnesium.  Covid testing is negative.  Work-up not consistent with CHF.  There is no obvious bacterial infection on exam or history or x-ray.  His WBC is impressive but he also just finished a steroid burst.  At this point, he will need reevaluation after his nebulizer treatments. Care to Dr. Lynelle Doctor. Final Clinical Impression(s) /  ED Diagnoses Final diagnoses:  None    Rx / DC Orders ED Discharge Orders    None       Pricilla LovelessGoldston, Markeisha Mancias, MD 03/11/20 0021

## 2020-03-11 DIAGNOSIS — J441 Chronic obstructive pulmonary disease with (acute) exacerbation: Secondary | ICD-10-CM | POA: Diagnosis not present

## 2020-03-11 LAB — TROPONIN I (HIGH SENSITIVITY): Troponin I (High Sensitivity): 8 ng/L (ref <18)

## 2020-03-11 MED ORDER — ALBUTEROL (5 MG/ML) CONTINUOUS INHALATION SOLN
10.0000 mg/h | INHALATION_SOLUTION | Freq: Once | RESPIRATORY_TRACT | Status: AC
  Administered 2020-03-11: 10 mg/h via RESPIRATORY_TRACT
  Filled 2020-03-11: qty 20

## 2020-03-11 MED ORDER — PREDNISONE 20 MG PO TABS: ORAL_TABLET | ORAL | 0 refills | Status: DC

## 2020-03-11 NOTE — Discharge Instructions (Signed)
Take the prednisone as prescribed.  Continue using your nebulizer for wheezing and shortness of breath.  Recheck if you get worse.

## 2020-03-11 NOTE — Progress Notes (Signed)
Patient was taken off CAT and placed back on [place] at about 2.5L.  Patient sats were around 93% and do good when he is non-verbal.  He began to ask questions and sats dropped a little.  Patient started dropping again so placed on 4L.  Patient maintaining around 91%.  Will continue to monitor.

## 2020-03-11 NOTE — ED Provider Notes (Signed)
Patient was left at change of shift to see how he did after his nebulizer.  When I rechecked him at 2:30 AM he was sitting on the side of the stretcher.  His pulse ox was 97% and his respiratory rate was 23.  He had a few rhonchi but still appeared to have some tachypnea and some retractions.  He is agreeable to a second nebulizer however he states it makes him feel nervous.  His heart rate was 117.  He was reassured that this is normal.  Recheck at 5:10 AM patient states he is feeling better.  When I listen to him he has some rare rhonchi present.  His pulse ox is 97 to 99% on oxygen 2 L/min nasal cannula which he is on at home.  He states he feels "pretty good".  Patient was discharged home.  He is complaining about feeling nervous after the nebulizer.  Patient was seen in the ED on the 10th and was prescribed prednisone 50 mg tablets at that time.  Patient does not have a cough or fever so antibiotics were not prescribed.  Diagnoses that have been ruled out:  None  Diagnoses that are still under consideration:  None  Final diagnoses:  COPD exacerbation [facility])   ED Discharge Orders         Ordered    predniSONE (DELTASONE) 20 MG tablet        03/11/20 0525         Plan discharge  Devoria Albe, MD, Concha Pyo, MD 03/11/20 [id number]

## 2020-03-11 NOTE — ED Notes (Signed)
PT educated with DC instructions and verbalized complete understanding, denies questions at this time. Pt IV removed fully intact and dressing applied. Pt to exit via wheelchair with [place] @ 4L. Pt to be transported by family with home O2 .

## 2020-03-25 ENCOUNTER — Encounter (HOSPITAL_COMMUNITY): Payer: Self-pay

## 2020-03-25 ENCOUNTER — Inpatient Hospital Stay (HOSPITAL_COMMUNITY)
Admission: EM | Admit: 2020-03-25 | Discharge: 2020-04-26 | DRG: 853 | Disposition: E | Payer: Medicare Other | Attending: Family Medicine | Admitting: Family Medicine

## 2020-03-25 ENCOUNTER — Emergency Department (HOSPITAL_COMMUNITY): Payer: Medicare Other

## 2020-03-25 ENCOUNTER — Other Ambulatory Visit: Payer: Self-pay

## 2020-03-25 DIAGNOSIS — K5909 Other constipation: Secondary | ICD-10-CM | POA: Diagnosis present

## 2020-03-25 DIAGNOSIS — J329 Chronic sinusitis, unspecified: Secondary | ICD-10-CM | POA: Diagnosis present

## 2020-03-25 DIAGNOSIS — E872 Acidosis: Secondary | ICD-10-CM | POA: Diagnosis not present

## 2020-03-25 DIAGNOSIS — E669 Obesity, unspecified: Secondary | ICD-10-CM | POA: Diagnosis present

## 2020-03-25 DIAGNOSIS — M545 Low back pain, unspecified: Secondary | ICD-10-CM | POA: Diagnosis present

## 2020-03-25 DIAGNOSIS — R198 Other specified symptoms and signs involving the digestive system and abdomen: Secondary | ICD-10-CM | POA: Diagnosis not present

## 2020-03-25 DIAGNOSIS — Z9981 Dependence on supplemental oxygen: Secondary | ICD-10-CM

## 2020-03-25 DIAGNOSIS — R34 Anuria and oliguria: Secondary | ICD-10-CM | POA: Diagnosis not present

## 2020-03-25 DIAGNOSIS — R791 Abnormal coagulation profile: Secondary | ICD-10-CM | POA: Diagnosis not present

## 2020-03-25 DIAGNOSIS — A419 Sepsis, unspecified organism: Principal | ICD-10-CM | POA: Diagnosis present

## 2020-03-25 DIAGNOSIS — K56609 Unspecified intestinal obstruction, unspecified as to partial versus complete obstruction: Secondary | ICD-10-CM

## 2020-03-25 DIAGNOSIS — E876 Hypokalemia: Secondary | ICD-10-CM | POA: Diagnosis present

## 2020-03-25 DIAGNOSIS — I2510 Atherosclerotic heart disease of native coronary artery without angina pectoris: Secondary | ICD-10-CM | POA: Diagnosis present

## 2020-03-25 DIAGNOSIS — K650 Generalized (acute) peritonitis: Secondary | ICD-10-CM | POA: Diagnosis not present

## 2020-03-25 DIAGNOSIS — K658 Other peritonitis: Secondary | ICD-10-CM | POA: Diagnosis not present

## 2020-03-25 DIAGNOSIS — Z8673 Personal history of transient ischemic attack (TIA), and cerebral infarction without residual deficits: Secondary | ICD-10-CM

## 2020-03-25 DIAGNOSIS — R6521 Severe sepsis with septic shock: Secondary | ICD-10-CM | POA: Diagnosis not present

## 2020-03-25 DIAGNOSIS — T380X5A Adverse effect of glucocorticoids and synthetic analogues, initial encounter: Secondary | ICD-10-CM | POA: Diagnosis not present

## 2020-03-25 DIAGNOSIS — Z79891 Long term (current) use of opiate analgesic: Secondary | ICD-10-CM

## 2020-03-25 DIAGNOSIS — D72829 Elevated white blood cell count, unspecified: Secondary | ICD-10-CM | POA: Diagnosis present

## 2020-03-25 DIAGNOSIS — E785 Hyperlipidemia, unspecified: Secondary | ICD-10-CM | POA: Diagnosis present

## 2020-03-25 DIAGNOSIS — Z4659 Encounter for fitting and adjustment of other gastrointestinal appliance and device: Secondary | ICD-10-CM

## 2020-03-25 DIAGNOSIS — Z765 Malingerer [conscious simulation]: Secondary | ICD-10-CM | POA: Diagnosis not present

## 2020-03-25 DIAGNOSIS — R0682 Tachypnea, not elsewhere classified: Secondary | ICD-10-CM | POA: Diagnosis present

## 2020-03-25 DIAGNOSIS — E559 Vitamin D deficiency, unspecified: Secondary | ICD-10-CM | POA: Diagnosis present

## 2020-03-25 DIAGNOSIS — R000 Tachycardia, unspecified: Secondary | ICD-10-CM | POA: Diagnosis not present

## 2020-03-25 DIAGNOSIS — Z01818 Encounter for other preprocedural examination: Secondary | ICD-10-CM

## 2020-03-25 DIAGNOSIS — Z7951 Long term (current) use of inhaled steroids: Secondary | ICD-10-CM

## 2020-03-25 DIAGNOSIS — M79605 Pain in left leg: Secondary | ICD-10-CM | POA: Diagnosis not present

## 2020-03-25 DIAGNOSIS — D62 Acute posthemorrhagic anemia: Secondary | ICD-10-CM | POA: Diagnosis not present

## 2020-03-25 DIAGNOSIS — I878 Other specified disorders of veins: Secondary | ICD-10-CM | POA: Diagnosis present

## 2020-03-25 DIAGNOSIS — J9600 Acute respiratory failure, unspecified whether with hypoxia or hypercapnia: Secondary | ICD-10-CM

## 2020-03-25 DIAGNOSIS — R0689 Other abnormalities of breathing: Secondary | ICD-10-CM

## 2020-03-25 DIAGNOSIS — K7460 Unspecified cirrhosis of liver: Secondary | ICD-10-CM | POA: Diagnosis present

## 2020-03-25 DIAGNOSIS — N170 Acute kidney failure with tubular necrosis: Secondary | ICD-10-CM | POA: Diagnosis not present

## 2020-03-25 DIAGNOSIS — R0600 Dyspnea, unspecified: Secondary | ICD-10-CM

## 2020-03-25 DIAGNOSIS — J9621 Acute and chronic respiratory failure with hypoxia: Secondary | ICD-10-CM | POA: Diagnosis present

## 2020-03-25 DIAGNOSIS — Z20822 Contact with and (suspected) exposure to covid-19: Secondary | ICD-10-CM | POA: Diagnosis present

## 2020-03-25 DIAGNOSIS — D6959 Other secondary thrombocytopenia: Secondary | ICD-10-CM | POA: Diagnosis not present

## 2020-03-25 DIAGNOSIS — J441 Chronic obstructive pulmonary disease with (acute) exacerbation: Secondary | ICD-10-CM | POA: Diagnosis present

## 2020-03-25 DIAGNOSIS — R1310 Dysphagia, unspecified: Secondary | ICD-10-CM | POA: Diagnosis present

## 2020-03-25 DIAGNOSIS — I5030 Unspecified diastolic (congestive) heart failure: Secondary | ICD-10-CM | POA: Diagnosis not present

## 2020-03-25 DIAGNOSIS — K7200 Acute and subacute hepatic failure without coma: Secondary | ICD-10-CM | POA: Diagnosis present

## 2020-03-25 DIAGNOSIS — K219 Gastro-esophageal reflux disease without esophagitis: Secondary | ICD-10-CM | POA: Diagnosis present

## 2020-03-25 DIAGNOSIS — I110 Hypertensive heart disease with heart failure: Secondary | ICD-10-CM | POA: Diagnosis present

## 2020-03-25 DIAGNOSIS — K5720 Diverticulitis of large intestine with perforation and abscess without bleeding: Secondary | ICD-10-CM | POA: Diagnosis present

## 2020-03-25 DIAGNOSIS — Z79899 Other long term (current) drug therapy: Secondary | ICD-10-CM

## 2020-03-25 DIAGNOSIS — Z978 Presence of other specified devices: Secondary | ICD-10-CM | POA: Diagnosis not present

## 2020-03-25 DIAGNOSIS — I4901 Ventricular fibrillation: Secondary | ICD-10-CM | POA: Diagnosis not present

## 2020-03-25 DIAGNOSIS — R739 Hyperglycemia, unspecified: Secondary | ICD-10-CM | POA: Diagnosis not present

## 2020-03-25 DIAGNOSIS — Z87891 Personal history of nicotine dependence: Secondary | ICD-10-CM

## 2020-03-25 DIAGNOSIS — G894 Chronic pain syndrome: Secondary | ICD-10-CM | POA: Diagnosis present

## 2020-03-25 DIAGNOSIS — Z6833 Body mass index (BMI) 33.0-33.9, adult: Secondary | ICD-10-CM

## 2020-03-25 DIAGNOSIS — L8990 Pressure ulcer of unspecified site, unspecified stage: Secondary | ICD-10-CM | POA: Insufficient documentation

## 2020-03-25 DIAGNOSIS — I5032 Chronic diastolic (congestive) heart failure: Secondary | ICD-10-CM | POA: Diagnosis present

## 2020-03-25 DIAGNOSIS — E861 Hypovolemia: Secondary | ICD-10-CM | POA: Diagnosis not present

## 2020-03-25 DIAGNOSIS — L89152 Pressure ulcer of sacral region, stage 2: Secondary | ICD-10-CM | POA: Clinically undetermined

## 2020-03-25 DIAGNOSIS — Z7982 Long term (current) use of aspirin: Secondary | ICD-10-CM

## 2020-03-25 DIAGNOSIS — I272 Pulmonary hypertension, unspecified: Secondary | ICD-10-CM | POA: Diagnosis present

## 2020-03-25 DIAGNOSIS — G8929 Other chronic pain: Secondary | ICD-10-CM | POA: Diagnosis not present

## 2020-03-25 DIAGNOSIS — I739 Peripheral vascular disease, unspecified: Secondary | ICD-10-CM | POA: Diagnosis present

## 2020-03-25 HISTORY — DX: Atherosclerotic heart disease of native coronary artery without angina pectoris: I25.10

## 2020-03-25 HISTORY — DX: Peripheral vascular disease, unspecified: I73.9

## 2020-03-25 LAB — CBC WITH DIFFERENTIAL/PLATELET
Abs Immature Granulocytes: 0.11 10*3/uL — ABNORMAL HIGH (ref 0.00–0.07)
Basophils Absolute: 0 10*3/uL (ref 0.0–0.1)
Basophils Relative: 0 %
Eosinophils Absolute: 0 10*3/uL (ref 0.0–0.5)
Eosinophils Relative: 0 %
HCT: 32.9 % — ABNORMAL LOW (ref 39.0–52.0)
Hemoglobin: 11.2 g/dL — ABNORMAL LOW (ref 13.0–17.0)
Immature Granulocytes: 1 %
Lymphocytes Relative: 8 %
Lymphs Abs: 1.5 10*3/uL (ref 0.7–4.0)
MCH: 32.1 pg (ref 26.0–34.0)
MCHC: 34 g/dL (ref 30.0–36.0)
MCV: 94.3 fL (ref 80.0–100.0)
Monocytes Absolute: 1.3 10*3/uL — ABNORMAL HIGH (ref 0.1–1.0)
Monocytes Relative: 7 %
Neutro Abs: 15.8 10*3/uL — ABNORMAL HIGH (ref 1.7–7.7)
Neutrophils Relative %: 84 %
Platelets: 224 10*3/uL (ref 150–400)
RBC: 3.49 MIL/uL — ABNORMAL LOW (ref 4.22–5.81)
RDW: 14.4 % (ref 11.5–15.5)
WBC: 18.7 10*3/uL — ABNORMAL HIGH (ref 4.0–10.5)
nRBC: 0 % (ref 0.0–0.2)

## 2020-03-25 LAB — RESPIRATORY PANEL BY RT PCR (FLU A&B, COVID)
Influenza A by PCR: NEGATIVE
Influenza B by PCR: NEGATIVE
SARS Coronavirus 2 by RT PCR: NEGATIVE

## 2020-03-25 LAB — COMPREHENSIVE METABOLIC PANEL
ALT: 25 U/L (ref 0–44)
AST: 14 U/L — ABNORMAL LOW (ref 15–41)
Albumin: 2.9 g/dL — ABNORMAL LOW (ref 3.5–5.0)
Alkaline Phosphatase: 114 U/L (ref 38–126)
Anion gap: 11 (ref 5–15)
BUN: 25 mg/dL — ABNORMAL HIGH (ref 6–20)
CO2: 27 mmol/L (ref 22–32)
Calcium: 8.5 mg/dL — ABNORMAL LOW (ref 8.9–10.3)
Chloride: 98 mmol/L (ref 98–111)
Creatinine, Ser: 0.6 mg/dL — ABNORMAL LOW (ref 0.61–1.24)
GFR, Estimated: >60 mL/min (ref >60)
Glucose, Bld: 137 mg/dL — ABNORMAL HIGH (ref 70–99)
Potassium: 2.8 mmol/L — ABNORMAL LOW (ref 3.5–5.1)
Sodium: 136 mmol/L (ref 135–145)
Total Bilirubin: 0.7 mg/dL (ref 0.3–1.2)
Total Protein: 6 g/dL — ABNORMAL LOW (ref 6.5–8.1)

## 2020-03-25 LAB — CBG MONITORING, ED
Glucose-Capillary: 165 mg/dL — ABNORMAL HIGH (ref 70–99)
Glucose-Capillary: 263 mg/dL — ABNORMAL HIGH (ref 70–99)

## 2020-03-25 LAB — HEMOGLOBIN A1C
Hgb A1c MFr Bld: 6 % — ABNORMAL HIGH (ref 4.8–5.6)
Mean Plasma Glucose: 125.5 mg/dL

## 2020-03-25 LAB — BRAIN NATRIURETIC PEPTIDE: B Natriuretic Peptide: 73 pg/mL (ref 0.0–100.0)

## 2020-03-25 LAB — D-DIMER, QUANTITATIVE: D-Dimer, Quant: 0.65 ug/mL-FEU — ABNORMAL HIGH (ref 0.00–0.50)

## 2020-03-25 LAB — MAGNESIUM: Magnesium: 1.5 mg/dL — ABNORMAL LOW (ref 1.7–2.4)

## 2020-03-25 LAB — HIV ANTIBODY (ROUTINE TESTING W REFLEX): HIV Screen 4th Generation wRfx: NONREACTIVE

## 2020-03-25 MED ORDER — ENOXAPARIN SODIUM 60 MG/0.6ML ~~LOC~~ SOLN
55.0000 mg | SUBCUTANEOUS | Status: DC
  Administered 2020-03-25: 55 mg via SUBCUTANEOUS
  Filled 2020-03-25: qty 0.6

## 2020-03-25 MED ORDER — POTASSIUM CHLORIDE CRYS ER 20 MEQ PO TBCR
40.0000 meq | EXTENDED_RELEASE_TABLET | Freq: Once | ORAL | Status: AC
  Administered 2020-03-25: 40 meq via ORAL
  Filled 2020-03-25: qty 2

## 2020-03-25 MED ORDER — NICOTINE 21 MG/24HR TD PT24
21.0000 mg | MEDICATED_PATCH | Freq: Every day | TRANSDERMAL | Status: DC | PRN
  Administered 2020-03-26: 21 mg via TRANSDERMAL
  Filled 2020-03-25: qty 1

## 2020-03-25 MED ORDER — METHYLPREDNISOLONE SODIUM SUCC 125 MG IJ SOLR
60.0000 mg | Freq: Four times a day (QID) | INTRAMUSCULAR | Status: DC
  Administered 2020-03-25 – 2020-03-26 (×3): 60 mg via INTRAVENOUS
  Filled 2020-03-25 (×3): qty 2

## 2020-03-25 MED ORDER — FENOFIBRATE 160 MG PO TABS
160.0000 mg | ORAL_TABLET | Freq: Every day | ORAL | Status: DC
  Filled 2020-03-25 (×2): qty 1

## 2020-03-25 MED ORDER — FUROSEMIDE 20 MG PO TABS
20.0000 mg | ORAL_TABLET | Freq: Every day | ORAL | Status: DC
  Administered 2020-03-25 – 2020-03-26 (×2): 20 mg via ORAL
  Filled 2020-03-25 (×2): qty 1

## 2020-03-25 MED ORDER — BUPROPION HCL ER (XL) 150 MG PO TB24
150.0000 mg | ORAL_TABLET | Freq: Every day | ORAL | Status: DC
  Administered 2020-03-25: 150 mg via ORAL
  Filled 2020-03-25 (×2): qty 1

## 2020-03-25 MED ORDER — LINACLOTIDE 145 MCG PO CAPS
290.0000 ug | ORAL_CAPSULE | Freq: Every day | ORAL | Status: DC
  Filled 2020-03-25 (×2): qty 1

## 2020-03-25 MED ORDER — ATORVASTATIN CALCIUM 40 MG PO TABS
40.0000 mg | ORAL_TABLET | Freq: Every day | ORAL | Status: DC
  Administered 2020-03-25: 40 mg via ORAL
  Filled 2020-03-25: qty 1

## 2020-03-25 MED ORDER — ONDANSETRON HCL 4 MG/2ML IJ SOLN
4.0000 mg | Freq: Four times a day (QID) | INTRAMUSCULAR | Status: DC | PRN
  Administered 2020-03-26: 4 mg via INTRAVENOUS
  Filled 2020-03-25: qty 2

## 2020-03-25 MED ORDER — IOHEXOL 350 MG/ML SOLN
75.0000 mL | Freq: Once | INTRAVENOUS | Status: AC | PRN
  Administered 2020-03-25: 75 mL via INTRAVENOUS

## 2020-03-25 MED ORDER — TRAZODONE HCL 50 MG PO TABS: 50.0000 mg | ORAL_TABLET | Freq: Every evening | ORAL | Status: DC | PRN

## 2020-03-25 MED ORDER — LIDOCAINE VISCOUS HCL 2 % MT SOLN
15.0000 mL | Freq: Once | OROMUCOSAL | Status: AC
  Administered 2020-03-25: 15 mL via ORAL
  Filled 2020-03-25: qty 15

## 2020-03-25 MED ORDER — POTASSIUM CHLORIDE 10 MEQ/100ML IV SOLN
10.0000 meq | Freq: Once | INTRAVENOUS | Status: AC
  Administered 2020-03-25: 10 meq via INTRAVENOUS
  Filled 2020-03-25: qty 100

## 2020-03-25 MED ORDER — ALUM & MAG HYDROXIDE-SIMETH 200-200-20 MG/5ML PO SUSP
30.0000 mL | Freq: Once | ORAL | Status: AC
  Administered 2020-03-25: 30 mL via ORAL
  Filled 2020-03-25: qty 30

## 2020-03-25 MED ORDER — DOXYCYCLINE HYCLATE 100 MG PO TABS
100.0000 mg | ORAL_TABLET | Freq: Two times a day (BID) | ORAL | Status: DC
  Administered 2020-03-25 (×2): 100 mg via ORAL
  Filled 2020-03-25 (×2): qty 1

## 2020-03-25 MED ORDER — MOMETASONE FURO-FORMOTEROL FUM 200-5 MCG/ACT IN AERO
2.0000 | INHALATION_SPRAY | Freq: Two times a day (BID) | RESPIRATORY_TRACT | Status: DC
  Administered 2020-03-25 – 2020-03-26 (×2): 2 via RESPIRATORY_TRACT
  Filled 2020-03-25: qty 8.8

## 2020-03-25 MED ORDER — GUAIFENESIN ER 600 MG PO TB12
1200.0000 mg | ORAL_TABLET | Freq: Two times a day (BID) | ORAL | Status: DC
  Administered 2020-03-25 (×2): 1200 mg via ORAL
  Filled 2020-03-25 (×2): qty 2

## 2020-03-25 MED ORDER — IPRATROPIUM-ALBUTEROL 0.5-2.5 (3) MG/3ML IN SOLN
3.0000 mL | Freq: Four times a day (QID) | RESPIRATORY_TRACT | Status: DC
  Administered 2020-03-25 – 2020-03-26 (×5): 3 mL via RESPIRATORY_TRACT
  Filled 2020-03-25 (×5): qty 3

## 2020-03-25 MED ORDER — MAGNESIUM SULFATE 2 GM/50ML IV SOLN
2.0000 g | Freq: Once | INTRAVENOUS | Status: AC
  Administered 2020-03-25: 2 g via INTRAVENOUS
  Filled 2020-03-25: qty 50

## 2020-03-25 MED ORDER — PANTOPRAZOLE SODIUM 40 MG PO TBEC
40.0000 mg | DELAYED_RELEASE_TABLET | Freq: Every day | ORAL | Status: DC
  Administered 2020-03-25: 40 mg via ORAL
  Filled 2020-03-25: qty 1

## 2020-03-25 MED ORDER — ONDANSETRON HCL 4 MG PO TABS: 4.0000 mg | ORAL_TABLET | Freq: Four times a day (QID) | ORAL | Status: DC | PRN

## 2020-03-25 MED ORDER — INSULIN ASPART 100 UNIT/ML ~~LOC~~ SOLN
0.0000 [IU] | Freq: Three times a day (TID) | SUBCUTANEOUS | Status: DC
  Administered 2020-03-25: 3 [IU] via SUBCUTANEOUS
  Filled 2020-03-25: qty 1

## 2020-03-25 MED ORDER — FLUTICASONE PROPIONATE 50 MCG/ACT NA SUSP
2.0000 | Freq: Every day | NASAL | Status: DC
  Filled 2020-03-25: qty 16

## 2020-03-25 MED ORDER — HYDROCODONE-ACETAMINOPHEN 10-325 MG PO TABS
1.0000 | ORAL_TABLET | ORAL | Status: DC | PRN
  Administered 2020-03-25: 1 via ORAL
  Filled 2020-03-25 (×2): qty 1

## 2020-03-25 MED ORDER — ALBUTEROL SULFATE HFA 108 (90 BASE) MCG/ACT IN AERS: 2.0000 | INHALATION_SPRAY | Freq: Four times a day (QID) | RESPIRATORY_TRACT | Status: DC | PRN

## 2020-03-25 MED ORDER — ALBUTEROL SULFATE HFA 108 (90 BASE) MCG/ACT IN AERS
4.0000 | INHALATION_SPRAY | Freq: Once | RESPIRATORY_TRACT | Status: AC
  Administered 2020-03-25: 4 via RESPIRATORY_TRACT
  Filled 2020-03-25: qty 6.7

## 2020-03-25 MED ORDER — VENLAFAXINE HCL ER 75 MG PO CP24
75.0000 mg | ORAL_CAPSULE | Freq: Every day | ORAL | Status: DC
  Administered 2020-03-26: 75 mg via ORAL
  Filled 2020-03-25 (×2): qty 1

## 2020-03-25 MED ORDER — ALBUTEROL (5 MG/ML) CONTINUOUS INHALATION SOLN
10.0000 mg/h | INHALATION_SOLUTION | Freq: Once | RESPIRATORY_TRACT | Status: AC
  Administered 2020-03-25: 10 mg/h via RESPIRATORY_TRACT
  Filled 2020-03-25: qty 20

## 2020-03-25 MED ORDER — ASPIRIN EC 81 MG PO TBEC: 81.0000 mg | DELAYED_RELEASE_TABLET | Freq: Every day | ORAL | Status: DC

## 2020-03-25 NOTE — ED Provider Notes (Signed)
[facility] EMERGENCY DEPARTMENT Provider Note   CSN: [id number] Arrival date & time: 03/11/2020  4098     History Chief Complaint  Patient presents with  . Shortness of Breath    Manuel Richard is a 49 y.o. male.  HPI 49 year old male presents with shortness of breath.  This is a recurring issue for him and he states it feels like his COPD.  Started all of a sudden in the middle of the night.  Has had cough with yellow sputum and shortness of breath.  Chronically has leg swelling that he feels is a little worse.  No chest pain.  EMS gave 3 albuterol treatments and IM Solu-Medrol and he feels like he is starting to turn around.   Past Medical History:  Diagnosis Date  . Anemia   . CHF (congestive heart failure) (HCC)   . Chronic back pain   . Chronic pain syndrome   . Chronic pain syndrome   . Chronic sinusitis   . COPD (chronic obstructive pulmonary disease) (HCC)   . Dyslipidemia   . Dysphagia   . Epididymitis    Chlamydial, Right  . Erectile dysfunction   . Fatigue   . GERD (gastroesophageal reflux disease)   . Groin pain   . High cholesterol   . Hydrocele    bilateral  . Lumbar radiculopathy   . Nicotine addiction   . Rectal bleeding   . Shortness of breath dyspnea   . Stroke (HCC) 2006   x 2     . Testicular pain     Patient Active Problem List   Diagnosis Date Noted  . COPD with acute exacerbation (HCC) 02/29/2020  . Acute and chronic respiratory failure with hypoxia (HCC) 03/21/2020  . Leukocytosis 03/12/2020  . Sinus tachycardia 03/01/2020  . Tachypnea 02/27/2020  . GERD (gastroesophageal reflux disease)   . Anemia 04/20/2018  . Chronic pain syndrome 04/20/2018  . Dyslipidemia 04/20/2018  . Vitamin D deficiency 04/20/2018  . Chronic low back pain (Primary Area of Pain) (Bilateral) (L>R) w/ sciatica (Left) 04/20/2018  . Chronic lower extremity pain (Secondary Area of Pain) (Left) 04/20/2018  . Pharmacologic therapy 04/20/2018  . Disorder of  skeletal system 04/20/2018  . Problems influencing health status 04/20/2018  . Long term current use of opiate analgesic 04/20/2018  . 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 Rectal bleeding 03/17/2018  . Dysphagia 03/17/2018  . Chronic low back pain (Midline) w/o sciatica 05/31/2015  . Back pain at L4-L5 level 02/15/2015  . DDD (degenerative disc disease), lumbosacral 02/15/2015  . Lumbar radiculopathy 02/15/2015  . Drug-seeking behavior 01/12/2015  . Bilateral hydrocele 01/12/2015  . Bilateral varicoceles 01/12/2015  . Orchitis and epididymitis 12/16/2014  . Epididymitis 11/29/2014  . Microscopic hematuria 11/29/2014    Past Surgical History:  Procedure Laterality Date  . APPENDECTOMY         Family History  Problem Relation Age of Onset  . Cancer Mother   . Cancer Father   . Colon cancer Neg Hx   . Colon polyps Neg Hx     Social History   Tobacco Use  . Smoking status: Former Smoker    Packs/day: 0.00    Years: 31.00    Pack years: 0.00  . Smokeless tobacco: Never Used  . Tobacco comment: stopped smoking one month ago  Vaping Use  . Vaping Use: Never used  Substance Use Topics  . Alcohol use: No    Alcohol/week: 0.0 standard drinks  Comment: no longer  . Drug use: No    Home Medications Prior to Admission medications   Medication Sig Start Date End Date Taking? Authorizing Provider  albuterol (PROVENTIL HFA;VENTOLIN HFA) 108 (90 Base) MCG/ACT inhaler Inhale 2 puffs into the lungs every 6 (six) hours as needed for wheezing or shortness of breath.   Yes [provider]  aspirin EC 81 MG tablet Take 81 mg by mouth daily.   Yes [provider]  atorvastatin (LIPITOR) 40 MG tablet Take 40 mg by mouth daily at 6 PM.  08/16/18  Yes [provider]  buPROPion (WELLBUTRIN XL) 150 MG 24 hr tablet Take 150 mg by mouth daily.   Yes [provider]  fenofibrate (TRICOR) 145 MG tablet Take 145 mg by mouth daily. 01/23/20   Yes [provider]  fluticasone (FLONASE) 50 MCG/ACT nasal spray Place 2 sprays into both nostrils daily. 02/09/20  Yes [provider]  furosemide (LASIX) 20 MG tablet Take 20 mg by mouth daily.  12/24/17  Yes [provider]  HYDROcodone-acetaminophen (NORCO) 10-325 MG tablet Take 1 tablet by mouth every 4 (four) hours as needed for pain. 07/02/19  Yes [provider]  ibuprofen (ADVIL) 800 MG tablet Take 800 mg by mouth 3 (three) times daily. 03/24/20  Yes [provider]  ipratropium-albuterol (DUONEB) 0.5-2.5 (3) MG/3ML SOLN Take 3 mLs by nebulization 3 (three) times daily.   Yes [provider]  LINZESS 290 MCG CAPS capsule Take 290 mcg by mouth daily. 05/04/19  Yes [provider]  methylPREDNISolone (MEDROL DOSEPAK) 4 MG TBPK tablet Take by mouth. 03/24/20  Yes [provider]  omeprazole (PRILOSEC) 40 MG capsule Take 40 mg by mouth daily. 01/11/20  Yes [provider]  sildenafil (REVATIO) 20 MG tablet Take 5 tablets by mouth as needed. 02/22/18  Yes [provider]  STIOLTO RESPIMAT 2.5-2.5 MCG/ACT AERS Inhale 2 puffs into the lungs daily. 07/05/18  Yes [provider]  ezetimibe (ZETIA) 10 MG tablet Take 10 mg by mouth at bedtime. 03/17/19   [provider]  Fluticasone-Salmeterol (ADVAIR DISKUS) 250-50 MCG/DOSE AEPB Inhale 1 puff into the lungs in the morning and at bedtime. Patient not taking: Reported on 03/02/2020 10/24/19   Burgess Amor, PA-C  predniSONE (DELTASONE) 20 MG tablet Take 3 po QD x 3d , then 2 po QD x 3d then 1 po QD x 3d Patient not taking: Reported on 03/13/2020 03/11/20   Devoria Albe, MD  venlafaxine XR (EFFEXOR-XR) 75 MG 24 hr capsule Take 75 mg by mouth daily. 03/08/20   [provider]    Allergies    Patient has no known allergies.  Review of Systems   Review of Systems  Constitutional: Negative for fever.  Respiratory: Positive for cough and shortness  of breath.   Cardiovascular: Positive for leg swelling. Negative for chest pain.  All other systems reviewed and are negative.   Physical Exam Updated Vital Signs BP (!) 151/113   Pulse (!) 128   Temp 98.6 F (37 C) (Oral)   Resp 20   Ht 6' (1.829 m)   Wt 110 kg   SpO2 97%   BMI 32.89 kg/m   Physical Exam Vitals and nursing note reviewed.  Constitutional:      Appearance: He is well-developed. He is obese. He is not diaphoretic.  HENT:     Head: Normocephalic and atraumatic.     Right Ear: External ear normal.  Left Ear: External ear normal.     Nose: Nose normal.  Eyes:     General:        Right eye: No discharge.        Left eye: No discharge.  Cardiovascular:     Rate and Rhythm: Regular rhythm. Tachycardia present.     Heart sounds: Normal heart sounds.  Pulmonary:     Effort: Pulmonary effort is normal. Tachypnea present. No accessory muscle usage or respiratory distress.     Breath sounds: Examination of the right-lower field reveals decreased breath sounds. Examination of the left-lower field reveals decreased breath sounds. Decreased breath sounds present.  Abdominal:     Palpations: Abdomen is soft.     Tenderness: There is no abdominal tenderness.  Musculoskeletal:     Cervical back: Neck supple.     Right lower leg: Edema present.     Left lower leg: Edema present.  Skin:    General: Skin is warm and dry.  Neurological:     Mental Status: He is alert.  Psychiatric:        Mood and Affect: Mood is not anxious.     ED Results / Procedures / Treatments   Labs (all labs ordered are listed, but only abnormal results are displayed) Labs Reviewed  COMPREHENSIVE METABOLIC PANEL - Abnormal; Notable for the following components:      Result Value   Potassium 2.8 (*)    Glucose, Bld 137 (*)    BUN 25 (*)    Creatinine, Ser 0.60 (*)    Calcium 8.5 (*)    Total Protein 6.0 (*)    Albumin 2.9 (*)    AST 14 (*)    All other components within normal  limits  CBC WITH DIFFERENTIAL/PLATELET - Abnormal; Notable for the following components:   WBC 18.7 (*)    RBC 3.49 (*)    Hemoglobin 11.2 (*)    HCT 32.9 (*)    Neutro Abs 15.8 (*)    Monocytes Absolute 1.3 (*)    Abs Immature Granulocytes 0.11 (*)    All other components within normal limits  D-DIMER, QUANTITATIVE (NOT AT [facility]) - Abnormal; Notable for the following components:   D-Dimer, Quant 0.65 (*)    All other components within normal limits  MAGNESIUM - Abnormal; Notable for the following components:   Magnesium 1.5 (*)    All other components within normal limits  RESPIRATORY PANEL BY RT PCR (FLU A&B, COVID)  BRAIN NATRIURETIC PEPTIDE    EKG EKG Interpretation  Date/Time:  Saturday March 25 2020 07:44:30 EDT Ventricular Rate:  115 PR Interval:    QRS Duration: 92 QT Interval:  328 QTC Calculation: 454 R Axis:   29 Text Interpretation: Sinus tachycardia Borderline T abnormalities, inferior leads Confirmed by Pricilla LovelessGoldston, Diedra Sinor [phone]([id number]) on 03/19/2020 7:57:01 AM   Radiology DG Chest 2 View  Result Date: 03/01/2020 CLINICAL DATA:  Shortness of breath EXAM: CHEST - 2 VIEW COMPARISON:  03/10/2020 FINDINGS: Chronic interstitial prominence. Minimal basilar atelectasis/scarring. No pleural effusion. No pneumothorax. Stable cardiomediastinal contours. IMPRESSION: Stable chronic interstitial changes. Minimal basilar atelectasis/scarring. Electronically Signed   By: Guadlupe SpanishPraneil  Patel M.D.   On: 02/27/2020 07:54   CT Angio Chest PE W and/or Wo Contrast  Result Date: 03/21/2020 CLINICAL DATA:  Shortness of breath and productive cough. EXAM: CT ANGIOGRAPHY CHEST WITH CONTRAST TECHNIQUE: Multidetector CT imaging of the chest was performed using the standard protocol during bolus administration of intravenous contrast. Multiplanar CT image  reconstructions and MIPs were obtained to evaluate the vascular anatomy. CONTRAST:  20mL OMNIPAQUE IOHEXOL 350 MG/ML SOLN COMPARISON:  Chest x-ray  March 25, 2020. CT scan of the chest Oct 17, 2018. FINDINGS: Cardiovascular: Coronary artery calcifications are identified, particularly in the left coronary arteries. The heart size is unchanged. The thoracic aorta demonstrates no aneurysm, dissection, or significant atherosclerotic change. Evaluation of the pulmonary arteries for emboli is significantly limited due to respiratory motion. In fact, the study is nondiagnostic beyond the level of the main pulmonary arteries. Within the severe limitations, no emboli definitively seen. Mediastinum/Nodes: No enlarged mediastinal, hilar, or axillary lymph nodes. Thyroid gland, trachea, and esophagus demonstrate no significant findings. Lungs/Pleura: Evaluation is significantly limited due to severe respiratory motion. Central airways are unremarkable other than some mucus in the right lower lobe bronchus. Mild paraseptal emphysema the right apex, unchanged. No pneumothorax identified. No definite focal infiltrate, nodule, or mass. Upper Abdomen: Bilateral adrenal nodularity is stable, likely adenomas. Musculoskeletal: Focal kyphosis is seen in the upper thoracic spine at T4-5, new since Oct 17, 2018 but otherwise age indeterminate. Severe loss of height is associated with both of these vertebral bodies consistent with age-indeterminate compression fractures. Review of the MIP images confirms the above findings. IMPRESSION: 1. Evaluation for pulmonary emboli is severely limited due to significant respiratory motion. The study is nondiagnostic beyond the level of the main pulmonary arteries. No emboli seen in the main pulmonary arteries. Repeat imaging could be performed in the patient is better able to cooperate. 2. Focal kyphosis is seen at T4-5, new since Oct 17, 2018 but otherwise age indeterminate. Severe loss of height of these vertebral bodies is consistent with age-indeterminate compression fractures. Recommend clinical correlation. 3. Coronary artery calcified  atherosclerosis, particularly in the left coronary arteries. 4. Evaluation of the lungs is severely limited due to respiratory motion without focal infiltrate, nodule, or mass identified. Electronically Signed   By: Gerome Sam III M.D   On: 03/07/2020 09:23    Procedures .Critical Care Performed by: Pricilla Loveless, MD Authorized by: Pricilla Loveless, MD   Critical care provider statement:    Critical care time (minutes):  35   Critical care time was exclusive of:  Separately billable procedures and treating other patients   Critical care was necessary to treat or prevent imminent or life-threatening deterioration of the following conditions:  Respiratory failure   Critical care was time spent personally by me on the following activities:  Discussions with consultants, evaluation of patient's response to treatment, examination of patient, ordering and performing treatments and interventions, ordering and review of laboratory studies, ordering and review of radiographic studies, pulse oximetry, re-evaluation of patient's condition, obtaining history from patient or surrogate and review of old charts   (including critical care time)  Medications Ordered in ED Medications  albuterol (VENTOLIN HFA) 108 (90 Base) MCG/ACT inhaler 4 puff (4 puffs Inhalation Given 03/06/2020 0822)  potassium chloride SA (KLOR-CON) CR tablet 40 mEq (40 mEq Oral Given 03/15/2020 0840)  potassium chloride 10 mEq in 100 mL IVPB (0 mEq Intravenous Stopped 03/17/2020 1000)  iohexol (OMNIPAQUE) 350 MG/ML injection 75 mL (75 mLs Intravenous Contrast Given 03/12/2020 0846)  magnesium sulfate IVPB 2 g 50 mL (0 g Intravenous Stopped 03/06/2020 1109)  albuterol (PROVENTIL,VENTOLIN) solution continuous neb (10 mg/hr Nebulization Given 03/20/2020 1114)    ED Course  I have reviewed the triage vital signs and the nursing notes.  Pertinent labs & imaging results that were available during my care of  the patient were reviewed by me and  considered in my medical decision making (see chart for details).    MDM Rules/Calculators/A&P                          Patient presents with what seems like a COPD exacerbation.  He is pretty tachycardic which may be the 3 albuterol treatments given by EMS but could also be something else so CT was obtained ultimately for PE.  Unfortunately the CT is not the greatest quality due to respiratory artifact.  I discussed doing another CT but patient declines and feels like this is a COPD.  I tend to agree as he is feeling somewhat better after continuous albuterol.  However he is still tachypneic and remains tachycardic despite rest after the albuterol.  While he is not in any distress anymore, I think you will need admission and supportive care.  He was given IV magnesium and potassium. Dr Laural Benes to admit. Final Clinical Impression(s) / ED Diagnoses Final diagnoses:  COPD exacerbation (HCC)    Rx / DC Orders ED Discharge Orders    None       Pricilla Loveless, MD Apr 22, 2020 1434

## 2020-03-25 NOTE — H&P (Addendum)
History and Physical  [facility]  Manuel LoaDonald P Dowdell ZOX:[id number] DOB: 1970/06/25 DOA: 03/14/2020  PCP: [place] JackWilliams, Christine G, FNP  Patient coming from: Home   I have personally briefly reviewed patient's [facility]  Chief Complaint: shortness of breath   HPI: Manuel Richard is a 49 y.o. male with medical history significant very severe COPD, longtime smoker with nicotine dependence, chronic pain, diastolic CHF, GERD, hyperlipidemia, supplemental oxygen dependent (2-4L/min), chronic dyspnea who had been followed by pulmonary clinic until he was dismissed from the practice due to belligerent behaviors.  He has been having a difficult time with chronic shortness of breath with acute exacerbations over the past month with multiple emergency department visits.  He was most recently seen in the emergency department on 1016 where he was treated for an acute exacerbation of COPD and sent home.  He returned to the emergency department today with symptoms of progressive shortness of breath difficulty speaking and poor exercise tolerance.  He has been having cough and shortness of breath with yellowish sputum production.  He also reports chronic leg edema which is mostly unchanged.  He says that his shortness of breath woke him up in the middle of the night.  He is not lying flat recumbent.  His main complaint is the shortness of breath.  He is breathing very fast.  He is adamant that he stopped smoking 1 month ago.  He has a 31+ year history of smoking up to 2 packs a day.  ED Course: The patient was tachycardic on arrival with a heart rate in the 120 range.  Patient was tachypneic with a respiratory rate of 21.  Temperature 98.6.  Pulse ox 99% on 3 L nasal cannula.  Blood pressure 111/90.  Patient was noted to have hypokalemia with a potassium of 2.8.  BUN 25 creatinine 0.60 calcium 8.5 albumin 2.9 magnesium 1.5 total protein 6.0 AST 14 ALT 25.  Cardiac BNP 73.0.  WBC 18.7.   Hemoglobin 11.2.  Platelet count 224.  Influenza a and B testing negative SARS 2 coronavirus test negative.  Chest x-ray no acute findings.  CT angio chest nondiagnostic beyond main pulmonary arteries due to poor quality study.  EKG with findings of sinus tachycardia.  Patient was given continuous nebulizer treatments.  Patient was given IV magnesium and potassium.  The patient was monitored in the emergency department for several hours however continued to have tachypnea and tachycardia.  He continues to have shortness of breath symptoms.  Admission was requested for further evaluation and management COPD exacerbation acute on chronic respiratory failure with hypoxia.  Review of Systems: As per HPI otherwise 10 point review of systems negative.   Past Medical History:  Diagnosis Date   Anemia    CHF (congestive heart failure) (HCC)    Chronic back pain    Chronic pain syndrome    Chronic pain syndrome    Chronic sinusitis    COPD (chronic obstructive pulmonary disease) (HCC)    Dyslipidemia    Dysphagia    Epididymitis    Chlamydial, Right   Erectile dysfunction    Fatigue    GERD (gastroesophageal reflux disease)    Groin pain    High cholesterol    Hydrocele    bilateral   Lumbar radiculopathy    Nicotine addiction    Rectal bleeding    Shortness of breath dyspnea    Stroke (HCC) 2006   x 2  Testicular pain     Past Surgical History:  Procedure Laterality Date   APPENDECTOMY       reports that he has quit smoking. He smoked 0.00 packs per day for 31.00 years. He has never used smokeless tobacco. He reports that he does not drink alcohol and does not use drugs.  No Known Allergies  Family History  Problem Relation Age of Onset   Cancer Mother    Cancer Father    Colon cancer Neg Hx    Colon polyps Neg Hx     Prior to Admission medications   Medication Sig Start Date End Date Taking? Authorizing Provider  albuterol (PROVENTIL  HFA;VENTOLIN HFA) 108 (90 Base) MCG/ACT inhaler Inhale 2 puffs into the lungs every 6 (six) hours as needed for wheezing or shortness of breath.   Yes [provider]  aspirin EC 81 MG tablet Take 81 mg by mouth daily.   Yes [provider]  atorvastatin (LIPITOR) 40 MG tablet Take 40 mg by mouth daily at 6 PM.  08/16/18  Yes [provider]  buPROPion (WELLBUTRIN XL) 150 MG 24 hr tablet Take 150 mg by mouth daily.   Yes [provider]  fenofibrate (TRICOR) 145 MG tablet Take 145 mg by mouth daily. 01/23/20  Yes [provider]  fluticasone (FLONASE) 50 MCG/ACT nasal spray Place 2 sprays into both nostrils daily. 02/09/20  Yes [provider]  furosemide (LASIX) 20 MG tablet Take 20 mg by mouth daily.  12/24/17  Yes [provider]  HYDROcodone-acetaminophen (NORCO) 10-325 MG tablet Take 1 tablet by mouth every 4 (four) hours as needed for pain. 07/02/19  Yes [provider]  ibuprofen (ADVIL) 800 MG tablet Take 800 mg by mouth 3 (three) times daily. 03/24/20  Yes [provider]  ipratropium-albuterol (DUONEB) 0.5-2.5 (3) MG/3ML SOLN Take 3 mLs by nebulization 3 (three) times daily.   Yes [provider]  LINZESS 290 MCG CAPS capsule Take 290 mcg by mouth daily. 05/04/19  Yes [provider]  methylPREDNISolone (MEDROL DOSEPAK) 4 MG TBPK tablet Take by mouth. 03/24/20  Yes [provider]  omeprazole (PRILOSEC) 40 MG capsule Take 40 mg by mouth daily. 01/11/20  Yes [provider]  sildenafil (REVATIO) 20 MG tablet Take 5 tablets by mouth as needed. 02/22/18  Yes [provider]  STIOLTO RESPIMAT 2.5-2.5 MCG/ACT AERS Inhale 2 puffs into the lungs daily. 07/05/18  Yes [provider]  ezetimibe (ZETIA) 10 MG tablet Take 10 mg by mouth at bedtime. 03/17/19   [provider]  Fluticasone-Salmeterol (ADVAIR DISKUS) 250-50 MCG/DOSE AEPB Inhale 1 puff into the lungs in the  morning and at bedtime. Patient not taking: Reported on 04-12-20 10/24/19   Burgess Amor, PA-C  venlafaxine XR (EFFEXOR-XR) 75 MG 24 hr capsule Take 75 mg by mouth daily. 03/08/20   [provider]    Physical Exam: Vitals:   04/12/20 1200 04-12-2020 1215 12-Apr-2020 1230 04/12/20 1245  BP: 106/78 104/73 120/82 (!) 151/113  Pulse: (!) 116 (!) 123 (!) 119 (!) 128  Resp: (!) 22 (!) 25 17 20   Temp:      TempSrc:      SpO2: 100% 96% 97% 97%  Weight:      Height:       Constitutional: Chronically ill-appearing morbidly obese male.  He is sitting up in a chair he is in no apparent distress.  He is speaking in short sentences.  He is having difficulty  with taking a full breath due to wheezing and coughing. Eyes: PERRL, lids and conjunctivae normal ENMT: Mucous membranes are moist. Posterior pharynx clear of any exudate or lesions.poor dentition.  Neck: normal, supple, no masses, no thyromegaly Respiratory: diffuse inspiratory and expiratory wheezing, no crackles. Moderate increased work of breathing.   Cardiovascular: tachycardic rate,  no murmurs / rubs / gallops. 2+ extremity edema. 2+ pedal pulses. No carotid bruits.  Abdomen: obese, soft,  no tenderness, no masses palpated. No hepatosplenomegaly. Bowel sounds positive.  Musculoskeletal: no clubbing / cyanosis. No joint deformity upper and lower extremities. Good ROM, no contractures. Normal muscle tone.  Skin: no rashes, lesions, ulcers. No induration Neurologic: CN 2-12 grossly intact. Sensation intact, DTR normal. Strength 5/5 in all 4.  Psychiatric: Alert and oriented x 3. Normal mood.   Labs on Admission: I have personally reviewed following labs and imaging studies  CBC: Recent Labs  Lab 03/05/2020 0725  WBC 18.7*  NEUTROABS 15.8*  HGB 11.2*  HCT 32.9*  MCV 94.3  PLT 224   Basic Metabolic Panel: Recent Labs  Lab 03/12/2020 0725  NA 136  K 2.8*  CL 98  CO2 27  GLUCOSE 137*  BUN 25*  CREATININE 0.60*  CALCIUM 8.5*   MG 1.5*   GFR: Estimated Creatinine Clearance: 144.7 mL/min (A) (by C-G formula based on SCr of 0.6 mg/dL (L)). Liver Function Tests: Recent Labs  Lab 03/18/2020 0725  AST 14*  ALT 25  ALKPHOS 114  BILITOT 0.7  PROT 6.0*  ALBUMIN 2.9*   No results for input(s): LIPASE, AMYLASE in the last 168 hours. No results for input(s): AMMONIA in the last 168 hours. Coagulation Profile: No results for input(s): INR, PROTIME in the last 168 hours. Cardiac Enzymes: No results for input(s): CKTOTAL, CKMB, CKMBINDEX, TROPONINI in the last 168 hours. BNP (last 3 results) No results for input(s): PROBNP in the last 8760 hours. HbA1C: No results for input(s): HGBA1C in the last 72 hours. CBG: No results for input(s): GLUCAP in the last 168 hours. Lipid Profile: No results for input(s): CHOL, HDL, LDLCALC, TRIG, CHOLHDL, LDLDIRECT in the last 72 hours. Thyroid Function Tests: No results for input(s): TSH, T4TOTAL, FREET4, T3FREE, THYROIDAB in the last 72 hours. Anemia Panel: No results for input(s): VITAMINB12, FOLATE, FERRITIN, TIBC, IRON, RETICCTPCT in the last 72 hours. Urine analysis:    Component Value Date/Time   APPEARANCEUR Clear 01/20/2015 0906   GLUCOSEU Negative 01/20/2015 0906   BILIRUBINUR Positive (A) 01/20/2015 0906   PROTEINUR Negative 01/20/2015 0906   NITRITE Negative 01/20/2015 0906   LEUKOCYTESUR Negative 01/20/2015 0906    Radiological Exams on Admission: DG Chest 2 View  Result Date: 03/24/2020 CLINICAL DATA:  Shortness of breath EXAM: CHEST - 2 VIEW COMPARISON:  03/10/2020 FINDINGS: Chronic interstitial prominence. Minimal basilar atelectasis/scarring. No pleural effusion. No pneumothorax. Stable cardiomediastinal contours. IMPRESSION: Stable chronic interstitial changes. Minimal basilar atelectasis/scarring. Electronically Signed   By: Guadlupe Spanish M.D.   On: 03/10/2020 07:54   CT Angio Chest PE W and/or Wo Contrast  Result Date: 03/06/2020 CLINICAL DATA:   Shortness of breath and productive cough. EXAM: CT ANGIOGRAPHY CHEST WITH CONTRAST TECHNIQUE: Multidetector CT imaging of the chest was performed using the standard protocol during bolus administration of intravenous contrast. Multiplanar CT image reconstructions and MIPs were obtained to evaluate the vascular anatomy. CONTRAST:  42mL OMNIPAQUE IOHEXOL 350 MG/ML SOLN COMPARISON:  Chest x-ray March 25, 2020. CT scan of the chest Oct 17, 2018. FINDINGS: Cardiovascular: Coronary  artery calcifications are identified, particularly in the left coronary arteries. The heart size is unchanged. The thoracic aorta demonstrates no aneurysm, dissection, or significant atherosclerotic change. Evaluation of the pulmonary arteries for emboli is significantly limited due to respiratory motion. In fact, the study is nondiagnostic beyond the level of the main pulmonary arteries. Within the severe limitations, no emboli definitively seen. Mediastinum/Nodes: No enlarged mediastinal, hilar, or axillary lymph nodes. Thyroid gland, trachea, and esophagus demonstrate no significant findings. Lungs/Pleura: Evaluation is significantly limited due to severe respiratory motion. Central airways are unremarkable other than some mucus in the right lower lobe bronchus. Mild paraseptal emphysema the right apex, unchanged. No pneumothorax identified. No definite focal infiltrate, nodule, or mass. Upper Abdomen: Bilateral adrenal nodularity is stable, likely adenomas. Musculoskeletal: Focal kyphosis is seen in the upper thoracic spine at T4-5, new since Oct 17, 2018 but otherwise age indeterminate. Severe loss of height is associated with both of these vertebral bodies consistent with age-indeterminate compression fractures. Review of the MIP images confirms the above findings. IMPRESSION: 1. Evaluation for pulmonary emboli is severely limited due to significant respiratory motion. The study is nondiagnostic beyond the level of the main pulmonary  arteries. No emboli seen in the main pulmonary arteries. Repeat imaging could be performed in the patient is better able to cooperate. 2. Focal kyphosis is seen at T4-5, new since Oct 17, 2018 but otherwise age indeterminate. Severe loss of height of these vertebral bodies is consistent with age-indeterminate compression fractures. Recommend clinical correlation. 3. Coronary artery calcified atherosclerosis, particularly in the left coronary arteries. 4. Evaluation of the lungs is severely limited due to respiratory motion without focal infiltrate, nodule, or mass identified. Electronically Signed   By: Gerome Sam III M.D   On: 04-22-2020 09:23   EKG: Independently reviewed. Sinus tachycardia   Assessment/Plan Principal Problem:   COPD with acute exacerbation (HCC) Active Problems:   Drug-seeking behavior   Back pain at L4-L5 level   Dysphagia   Chronic pain syndrome   Dyslipidemia   Vitamin D deficiency   Chronic lower extremity pain (Secondary Area of Pain) (Left)   Long term current use of opiate analgesic   GERD (gastroesophageal reflux disease)   Acute and chronic respiratory failure with hypoxia (HCC)   Leukocytosis   Sinus tachycardia   Tachypnea   1. Acute respiratory failure with hypoxia-secondary to COPD with acute exacerbation.  Copious sputum production and coughing.  He failed treatment in the emergency department to resolve symptoms enough for him to go home.  He will be admitted for further management.  Continue IV steroids.  Add antibiotics.  Doxycycline 100 mg twice daily ordered.  Scheduled bronchodilators ordered.  Flutter valve ordered.  Mucinex 1200 mg twice daily ordered. 2. Leukocytosis-suspect secondary to recent steroid use as he had received steroids and a tapered dose from recent ED visit.  Follow CBC. 3. GERD-Protonix ordered for GI protection. 4. Chronic pain-resume home medication. 5. Chronic constipation-resume home Linzess and follow. 6. Chronic tobacco  abuse-patient reports he quit 1 month ago.  I have ordered a nicotine patch to use as needed for cravings. 7. Sinus tachycardia-suspect secondary to prolonged albuterol treatments in the emergency department.  He recently had a continuous albuterol treatment. 8. Chronic respiratory failure-resume supplemental oxygen and titrate back to home dose as able. 9. Hypokalemia / Hypomagnesemia - replacements given in ED, recheck in AM.   DVT prophylaxis: Enoxaparin Code Status: Full Family Communication: No family present at bedside Disposition Plan: Home when  medically stabilized Consults called: N/A Admission status: INP  Kamalani Mastro MD [facility] How to contact the [facility] Attending or Consulting provider 7A - 7P or covering provider during after hours 7P -7A, for this patient?  1. Check the care team in [facility] and look for a) attending/consulting [facility] provider listed and b) the [facility] team listed 2. Log into [email or web address] and use [place]'s universal password to access. If you do not have the password, please contact the hospital operator. 3. Locate the [facility] provider you are looking for under [facility] and page to a number that you can be directly reached. 4. If you still have difficulty reaching the provider, please page the [facility] (Director on Call) for the Hospitalists listed on amion for assistance.  If 7PM-7AM, please contact night-coverage [email or web address] Password [email or web address]  03/20/2020, 3:11 PM

## 2020-03-25 NOTE — ED Triage Notes (Signed)
Pt reports SOB that became worse last night. Wears chronic O2 at 2 L via [employer]. Sats upon arrival 94%  Pt was given 3 albuterol and 1 atrovent. EMS gave 125 mg solumedrol IM. Pt coughing up yellow mucous

## 2020-03-25 NOTE — ED Notes (Signed)
Patient transported to CT 

## 2020-03-25 NOTE — ED Notes (Signed)
Patient repeatedly removing pulse ox and has removed cardiac leads. Informed these must remain in place and he continues to be non-compliant when replaced.

## 2020-03-26 ENCOUNTER — Inpatient Hospital Stay (HOSPITAL_COMMUNITY): Payer: Medicare Other

## 2020-03-26 ENCOUNTER — Inpatient Hospital Stay (HOSPITAL_COMMUNITY): Payer: Medicare Other | Admitting: Anesthesiology

## 2020-03-26 ENCOUNTER — Encounter (HOSPITAL_COMMUNITY): Payer: Self-pay | Admitting: Family Medicine

## 2020-03-26 ENCOUNTER — Inpatient Hospital Stay: Payer: Self-pay

## 2020-03-26 ENCOUNTER — Encounter (HOSPITAL_COMMUNITY): Admission: EM | Disposition: E | Payer: Self-pay | Source: Home / Self Care | Attending: Family Medicine

## 2020-03-26 DIAGNOSIS — M545 Low back pain, unspecified: Secondary | ICD-10-CM

## 2020-03-26 DIAGNOSIS — G8929 Other chronic pain: Secondary | ICD-10-CM

## 2020-03-26 DIAGNOSIS — R198 Other specified symptoms and signs involving the digestive system and abdomen: Secondary | ICD-10-CM | POA: Diagnosis not present

## 2020-03-26 DIAGNOSIS — K5720 Diverticulitis of large intestine with perforation and abscess without bleeding: Secondary | ICD-10-CM

## 2020-03-26 DIAGNOSIS — Z765 Malingerer [conscious simulation]: Secondary | ICD-10-CM

## 2020-03-26 DIAGNOSIS — K650 Generalized (acute) peritonitis: Secondary | ICD-10-CM

## 2020-03-26 DIAGNOSIS — M79605 Pain in left leg: Secondary | ICD-10-CM

## 2020-03-26 HISTORY — PX: COLOSTOMY: SHX63

## 2020-03-26 HISTORY — PX: LAPAROTOMY: SHX154

## 2020-03-26 LAB — COMPREHENSIVE METABOLIC PANEL
ALT: 24 U/L (ref 0–44)
AST: 16 U/L (ref 15–41)
Albumin: 2.9 g/dL — ABNORMAL LOW (ref 3.5–5.0)
Alkaline Phosphatase: 103 U/L (ref 38–126)
Anion gap: 16 — ABNORMAL HIGH (ref 5–15)
BUN: 33 mg/dL — ABNORMAL HIGH (ref 6–20)
CO2: 22 mmol/L (ref 22–32)
Calcium: 9 mg/dL (ref 8.9–10.3)
Chloride: 97 mmol/L — ABNORMAL LOW (ref 98–111)
Creatinine, Ser: 1.12 mg/dL (ref 0.61–1.24)
GFR, Estimated: >60 mL/min (ref >60)
Glucose, Bld: 264 mg/dL — ABNORMAL HIGH (ref 70–99)
Potassium: 3.2 mmol/L — ABNORMAL LOW (ref 3.5–5.1)
Sodium: 135 mmol/L (ref 135–145)
Total Bilirubin: 0.9 mg/dL (ref 0.3–1.2)
Total Protein: 6 g/dL — ABNORMAL LOW (ref 6.5–8.1)

## 2020-03-26 LAB — GLUCOSE, CAPILLARY
Glucose-Capillary: 273 mg/dL — ABNORMAL HIGH (ref 70–99)
Glucose-Capillary: 322 mg/dL — ABNORMAL HIGH (ref 70–99)
Glucose-Capillary: 170 mg/dL — ABNORMAL HIGH (ref 70–99)

## 2020-03-26 LAB — BLOOD GAS, ARTERIAL
Acid-base deficit: 8.7 mmol/L — ABNORMAL HIGH (ref 0.0–2.0)
Acid-base deficit: 6.3 mmol/L — ABNORMAL HIGH (ref 0.0–2.0)
Bicarbonate: 18.7 mmol/L — ABNORMAL LOW (ref 20.0–28.0)
Bicarbonate: 19 mmol/L — ABNORMAL LOW (ref 20.0–28.0)
FIO2: 50
FIO2: 40
O2 Saturation: 98.8 %
O2 Saturation: 92 %
Patient temperature: 37
Patient temperature: 37
pCO2 arterial: 20.6 mmHg — ABNORMAL LOW (ref 32.0–48.0)
pCO2 arterial: 42.1 mmHg (ref 32.0–48.0)
pH, Arterial: 7.461 — ABNORMAL HIGH (ref 7.350–7.450)
pH, Arterial: 7.282 — ABNORMAL LOW (ref 7.350–7.450)
pO2, Arterial: 126 mmHg — ABNORMAL HIGH (ref 83.0–108.0)
pO2, Arterial: 78.2 mmHg — ABNORMAL LOW (ref 83.0–108.0)

## 2020-03-26 LAB — CBC WITH DIFFERENTIAL/PLATELET
Abs Immature Granulocytes: 0.08 10*3/uL — ABNORMAL HIGH (ref 0.00–0.07)
Band Neutrophils: 14 %
Basophils Absolute: 0 10*3/uL (ref 0.0–0.1)
Basophils Absolute: 0.1 10*3/uL (ref 0.0–0.1)
Basophils Relative: 0 %
Basophils Relative: 1 %
Eosinophils Absolute: 0 10*3/uL (ref 0.0–0.5)
Eosinophils Absolute: 0 10*3/uL (ref 0.0–0.5)
Eosinophils Relative: 0 %
Eosinophils Relative: 0 %
HCT: 39.2 % (ref 39.0–52.0)
HCT: 30.2 % — ABNORMAL LOW (ref 39.0–52.0)
Hemoglobin: 12.9 g/dL — ABNORMAL LOW (ref 13.0–17.0)
Hemoglobin: 9.7 g/dL — ABNORMAL LOW (ref 13.0–17.0)
Immature Granulocytes: 1 %
Lymphocytes Relative: 1 %
Lymphocytes Relative: 13 %
Lymphs Abs: 0.2 10*3/uL — ABNORMAL LOW (ref 0.7–4.0)
Lymphs Abs: 0.8 10*3/uL (ref 0.7–4.0)
MCH: 30.9 pg (ref 26.0–34.0)
MCH: 31.6 pg (ref 26.0–34.0)
MCHC: 32.9 g/dL (ref 30.0–36.0)
MCHC: 32.1 g/dL (ref 30.0–36.0)
MCV: 93.8 fL (ref 80.0–100.0)
MCV: 98.4 fL (ref 80.0–100.0)
Metamyelocytes Relative: 9 %
Monocytes Absolute: 0.9 10*3/uL (ref 0.1–1.0)
Monocytes Absolute: 0.2 10*3/uL (ref 0.1–1.0)
Monocytes Relative: 6 %
Monocytes Relative: 3 %
Myelocytes: 1 %
Neutro Abs: 13.1 10*3/uL — ABNORMAL HIGH (ref 1.7–7.7)
Neutro Abs: 4.9 10*3/uL (ref 1.7–7.7)
Neutrophils Relative %: 69 %
Neutrophils Relative %: 82 %
Platelets: 260 10*3/uL (ref 150–400)
Platelets: 119 10*3/uL — ABNORMAL LOW (ref 150–400)
RBC: 4.18 MIL/uL — ABNORMAL LOW (ref 4.22–5.81)
RBC: 3.07 MIL/uL — ABNORMAL LOW (ref 4.22–5.81)
RDW: 14.6 % (ref 11.5–15.5)
RDW: 14.3 % (ref 11.5–15.5)
WBC Morphology: ABNORMAL
WBC: 15.8 10*3/uL — ABNORMAL HIGH (ref 4.0–10.5)
WBC: 6 10*3/uL (ref 4.0–10.5)
nRBC: 0 % (ref 0.0–0.2)
nRBC: 1 % — ABNORMAL HIGH (ref 0.0–0.2)

## 2020-03-26 LAB — MAGNESIUM
Magnesium: 2 mg/dL (ref 1.7–2.4)
Magnesium: 2.1 mg/dL (ref 1.7–2.4)

## 2020-03-26 LAB — PROTIME-INR
INR: 3.2 — ABNORMAL HIGH (ref 0.8–1.2)
Prothrombin Time: 31.4 seconds — ABNORMAL HIGH (ref 11.4–15.2)

## 2020-03-26 LAB — MRSA PCR SCREENING: MRSA by PCR: NEGATIVE

## 2020-03-26 LAB — PHOSPHORUS: Phosphorus: 6.7 mg/dL — ABNORMAL HIGH (ref 2.5–4.6)

## 2020-03-26 LAB — APTT: aPTT: 43 seconds — ABNORMAL HIGH (ref 24–36)

## 2020-03-26 SURGERY — LAPAROTOMY, EXPLORATORY
Anesthesia: General | Laterality: Right

## 2020-03-26 MED ORDER — POTASSIUM CHLORIDE 10 MEQ/100ML IV SOLN
10.0000 meq | INTRAVENOUS | Status: AC
  Administered 2020-03-26 (×4): 10 meq via INTRAVENOUS
  Filled 2020-03-26: qty 100

## 2020-03-26 MED ORDER — ETOMIDATE 2 MG/ML IV SOLN
INTRAVENOUS | Status: AC
  Filled 2020-03-26: qty 10

## 2020-03-26 MED ORDER — SODIUM CHLORIDE 0.9 % IV SOLN: 2.0000 g | Freq: Once | INTRAVENOUS | Status: DC

## 2020-03-26 MED ORDER — POTASSIUM CHLORIDE 10 MEQ/100ML IV SOLN
10.0000 meq | INTRAVENOUS | Status: DC
  Filled 2020-03-26: qty 100

## 2020-03-26 MED ORDER — LEVALBUTEROL HCL 0.63 MG/3ML IN NEBU
0.6300 mg | INHALATION_SOLUTION | Freq: Four times a day (QID) | RESPIRATORY_TRACT | Status: DC
  Administered 2020-03-27 – 2020-03-29 (×12): 0.63 mg via RESPIRATORY_TRACT
  Filled 2020-03-26 (×12): qty 3

## 2020-03-26 MED ORDER — INSULIN ASPART 100 UNIT/ML ~~LOC~~ SOLN: 0.0000 [IU] | Freq: Every day | SUBCUTANEOUS | Status: DC

## 2020-03-26 MED ORDER — PHENYLEPHRINE 40 MCG/ML (10ML) SYRINGE FOR IV PUSH (FOR BLOOD PRESSURE SUPPORT)
PREFILLED_SYRINGE | INTRAVENOUS | Status: AC
  Filled 2020-03-26: qty 40

## 2020-03-26 MED ORDER — ALBUMIN HUMAN 25 % IV SOLN
50.0000 g | Freq: Four times a day (QID) | INTRAVENOUS | Status: AC
  Administered 2020-03-27 (×4): 50 g via INTRAVENOUS
  Filled 2020-03-26 (×3): qty 200

## 2020-03-26 MED ORDER — NOREPINEPHRINE 4 MG/250ML-% IV SOLN
INTRAVENOUS | Status: DC | PRN
  Administered 2020-03-26: 6 ug/min via INTRAVENOUS

## 2020-03-26 MED ORDER — FUROSEMIDE 10 MG/ML IJ SOLN
30.0000 mg | Freq: Every day | INTRAMUSCULAR | Status: DC
  Administered 2020-03-26 – 2020-03-28 (×3): 30 mg via INTRAVENOUS
  Filled 2020-03-26 (×4): qty 4

## 2020-03-26 MED ORDER — LACTATED RINGERS IV BOLUS (SEPSIS): 250.0000 mL | Freq: Once | INTRAVENOUS | Status: DC

## 2020-03-26 MED ORDER — SODIUM CHLORIDE 0.9 % IV SOLN
2.0000 g | Freq: Once | INTRAVENOUS | Status: AC
  Administered 2020-03-27: 2 g via INTRAVENOUS
  Filled 2020-03-26: qty 2

## 2020-03-26 MED ORDER — FENTANYL CITRATE (PF) 100 MCG/2ML IJ SOLN
50.0000 ug | INTRAMUSCULAR | Status: DC | PRN
  Administered 2020-03-30: 50 ug via INTRAVENOUS

## 2020-03-26 MED ORDER — ALBUMIN HUMAN 25 % IV SOLN: INTRAVENOUS | Status: DC | PRN

## 2020-03-26 MED ORDER — SODIUM CHLORIDE 0.9 % IV BOLUS
250.0000 mL | Freq: Once | INTRAVENOUS | Status: AC
  Administered 2020-03-26: 250 mL via INTRAVENOUS

## 2020-03-26 MED ORDER — DOCUSATE SODIUM 50 MG/5ML PO LIQD
100.0000 mg | Freq: Two times a day (BID) | ORAL | Status: DC
  Administered 2020-03-28 – 2020-03-30 (×5): 100 mg
  Filled 2020-03-26 (×5): qty 10

## 2020-03-26 MED ORDER — NOREPINEPHRINE BITARTRATE 1 MG/ML IV SOLN
INTRAVENOUS | Status: AC
  Filled 2020-03-26: qty 4

## 2020-03-26 MED ORDER — EPHEDRINE 5 MG/ML INJ
INTRAVENOUS | Status: AC
  Filled 2020-03-26: qty 30

## 2020-03-26 MED ORDER — PROPOFOL 1000 MG/100ML IV EMUL
0.0000 ug/kg/min | INTRAVENOUS | Status: DC
  Administered 2020-03-27: 20.026 ug/kg/min via INTRAVENOUS

## 2020-03-26 MED ORDER — ETOMIDATE 2 MG/ML IV SOLN
INTRAVENOUS | Status: DC | PRN
  Administered 2020-03-26: 10 mg via INTRAVENOUS

## 2020-03-26 MED ORDER — ROCURONIUM BROMIDE 10 MG/ML (PF) SYRINGE
PREFILLED_SYRINGE | INTRAVENOUS | Status: AC
  Filled 2020-03-26: qty 10

## 2020-03-26 MED ORDER — FENTANYL CITRATE (PF) 100 MCG/2ML IJ SOLN
12.5000 ug | INTRAMUSCULAR | Status: DC | PRN
  Administered 2020-03-26 (×3): 25 ug via INTRAVENOUS
  Filled 2020-03-26 (×3): qty 2

## 2020-03-26 MED ORDER — CHLORHEXIDINE GLUCONATE CLOTH 2 % EX PADS
6.0000 | MEDICATED_PAD | Freq: Every day | CUTANEOUS | Status: DC
  Administered 2020-03-26 – 2020-03-31 (×6): 6 via TOPICAL

## 2020-03-26 MED ORDER — INSULIN GLARGINE 100 UNIT/ML ~~LOC~~ SOLN
15.0000 [IU] | Freq: Every day | SUBCUTANEOUS | Status: DC
  Administered 2020-03-26: 15 [IU] via SUBCUTANEOUS
  Filled 2020-03-26 (×4): qty 0.15

## 2020-03-26 MED ORDER — LACTATED RINGERS IV BOLUS
1000.0000 mL | Freq: Once | INTRAVENOUS | Status: AC
  Administered 2020-03-26: 1000 mL via INTRAVENOUS

## 2020-03-26 MED ORDER — NOREPINEPHRINE 16 MG/250ML-% IV SOLN
INTRAVENOUS | Status: AC
  Filled 2020-03-26: qty 250

## 2020-03-26 MED ORDER — POLYETHYLENE GLYCOL 3350 17 G PO PACK
17.0000 g | PACK | Freq: Every day | ORAL | Status: DC
  Administered 2020-03-30: 17 g
  Filled 2020-03-26: qty 1

## 2020-03-26 MED ORDER — PROPOFOL 1000 MG/100ML IV EMUL
5.0000 ug/kg/min | INTRAVENOUS | Status: DC
  Filled 2020-03-26: qty 100

## 2020-03-26 MED ORDER — PANTOPRAZOLE SODIUM 40 MG IV SOLR
40.0000 mg | INTRAVENOUS | Status: DC
  Administered 2020-03-26 – 2020-03-30 (×5): 40 mg via INTRAVENOUS
  Filled 2020-03-26 (×5): qty 40

## 2020-03-26 MED ORDER — INSULIN ASPART 100 UNIT/ML ~~LOC~~ SOLN
0.0000 [IU] | Freq: Three times a day (TID) | SUBCUTANEOUS | Status: DC
  Administered 2020-03-26: 15 [IU] via SUBCUTANEOUS

## 2020-03-26 MED ORDER — INSULIN ASPART 100 UNIT/ML ~~LOC~~ SOLN
0.0000 [IU] | SUBCUTANEOUS | Status: DC
  Administered 2020-03-26: 4 [IU] via SUBCUTANEOUS

## 2020-03-26 MED ORDER — SODIUM CHLORIDE 0.9 % IR SOLN
Status: DC | PRN
  Administered 2020-03-26 (×3): 1000 mL

## 2020-03-26 MED ORDER — METRONIDAZOLE IN NACL 5-0.79 MG/ML-% IV SOLN
500.0000 mg | Freq: Three times a day (TID) | INTRAVENOUS | Status: DC
  Administered 2020-03-27 – 2020-03-31 (×13): 500 mg via INTRAVENOUS
  Filled 2020-03-26 (×14): qty 100

## 2020-03-26 MED ORDER — SUCCINYLCHOLINE CHLORIDE 20 MG/ML IJ SOLN
INTRAMUSCULAR | Status: DC | PRN
  Administered 2020-03-26: 50 mg via INTRAVENOUS

## 2020-03-26 MED ORDER — LIDOCAINE 2% (20 MG/ML) 5 ML SYRINGE
INTRAMUSCULAR | Status: AC
  Filled 2020-03-26: qty 5

## 2020-03-26 MED ORDER — SODIUM CHLORIDE (PF) 0.9 % IJ SOLN
INTRAMUSCULAR | Status: AC
  Filled 2020-03-26: qty 30

## 2020-03-26 MED ORDER — SODIUM CHLORIDE 0.9 % IV SOLN
2.0000 g | INTRAVENOUS | Status: AC
  Administered 2020-03-26: 2 g via INTRAVENOUS
  Filled 2020-03-26: qty 2

## 2020-03-26 MED ORDER — ALBUMIN HUMAN 25 % IV SOLN
INTRAVENOUS | Status: AC
  Filled 2020-03-26: qty 150

## 2020-03-26 MED ORDER — SODIUM CHLORIDE 0.9 % IV BOLUS: 500.0000 mL | Freq: Once | INTRAVENOUS | Status: DC

## 2020-03-26 MED ORDER — LIDOCAINE HCL URETHRAL/MUCOSAL 2 % EX GEL
CUTANEOUS | Status: DC | PRN
  Administered 2020-03-26: 50 via TOPICAL

## 2020-03-26 MED ORDER — SODIUM CHLORIDE 0.9 % IV SOLN
2.0000 g | Freq: Three times a day (TID) | INTRAVENOUS | Status: DC
  Administered 2020-03-27: 2 g via INTRAVENOUS
  Filled 2020-03-26: qty 2

## 2020-03-26 MED ORDER — SUCCINYLCHOLINE CHLORIDE 200 MG/10ML IV SOSY
PREFILLED_SYRINGE | INTRAVENOUS | Status: AC
  Filled 2020-03-26: qty 10

## 2020-03-26 MED ORDER — SODIUM CHLORIDE 0.9 % IV SOLN
INTRAVENOUS | Status: AC
  Filled 2020-03-26: qty 150

## 2020-03-26 MED ORDER — FENTANYL CITRATE (PF) 100 MCG/2ML IJ SOLN
50.0000 ug | INTRAMUSCULAR | Status: DC | PRN
  Administered 2020-03-27 (×4): 100 ug via INTRAVENOUS
  Filled 2020-03-26: qty 2

## 2020-03-26 MED ORDER — SODIUM CHLORIDE 0.9 % IV SOLN
100.0000 mg | Freq: Two times a day (BID) | INTRAVENOUS | Status: DC
  Filled 2020-03-26 (×3): qty 100

## 2020-03-26 MED ORDER — INSULIN ASPART 100 UNIT/ML ~~LOC~~ SOLN: 6.0000 [IU] | Freq: Three times a day (TID) | SUBCUTANEOUS | Status: DC

## 2020-03-26 MED ORDER — ALBUMIN HUMAN 25 % IV SOLN: 50.0000 g | Freq: Four times a day (QID) | INTRAVENOUS | Status: DC

## 2020-03-26 MED ORDER — METHYLPREDNISOLONE SODIUM SUCC 125 MG IJ SOLR
80.0000 mg | Freq: Four times a day (QID) | INTRAMUSCULAR | Status: DC
  Administered 2020-03-26 – 2020-03-27 (×4): 80 mg via INTRAVENOUS
  Filled 2020-03-26 (×4): qty 2

## 2020-03-26 MED ORDER — LACTATED RINGERS IV SOLN: INTRAVENOUS | Status: DC | PRN

## 2020-03-26 MED ORDER — SODIUM CHLORIDE 0.9 % IV SOLN
INTRAVENOUS | Status: DC | PRN
  Administered 2020-03-26 (×2): 400 ug via INTRAVENOUS
  Administered 2020-03-26 (×2): 200 ug via INTRAVENOUS
  Administered 2020-03-26: 400 ug via INTRAVENOUS
  Administered 2020-03-26 (×2): 200 ug via INTRAVENOUS
  Administered 2020-03-26: 400 ug via INTRAVENOUS

## 2020-03-26 MED ORDER — EPHEDRINE SULFATE 50 MG/ML IJ SOLN
INTRAMUSCULAR | Status: DC | PRN
  Administered 2020-03-26 (×4): 50 mg via INTRAVENOUS

## 2020-03-26 MED ORDER — LACTATED RINGERS IV BOLUS
1000.0000 mL | Freq: Once | INTRAVENOUS | Status: AC
  Administered 2020-03-27: 1000 mL via INTRAVENOUS

## 2020-03-26 MED ORDER — PHENYLEPHRINE HCL (PRESSORS) 10 MG/ML IV SOLN
INTRAVENOUS | Status: AC
  Filled 2020-03-26: qty 1

## 2020-03-26 MED ORDER — ROCURONIUM BROMIDE 100 MG/10ML IV SOLN
INTRAVENOUS | Status: DC | PRN
  Administered 2020-03-26: 20 mg via INTRAVENOUS
  Administered 2020-03-26: 50 mg via INTRAVENOUS

## 2020-03-26 MED ORDER — EPHEDRINE 5 MG/ML INJ
INTRAVENOUS | Status: AC
  Filled 2020-03-26: qty 10

## 2020-03-26 MED ORDER — NOREPINEPHRINE 16 MG/250ML-% IV SOLN
0.0000 ug/min | INTRAVENOUS | Status: DC
  Administered 2020-03-27: 15.04 ug/min via INTRAVENOUS
  Administered 2020-03-31: 2 ug/min via INTRAVENOUS
  Filled 2020-03-26: qty 250

## 2020-03-26 SURGICAL SUPPLY — 46 items
BARRIER SKIN 2 3/4 (OSTOMY) ×3 IMPLANT
CELLS DAT CNTRL 66122 CELL SVR (MISCELLANEOUS) ×2 IMPLANT
CHLORAPREP W/TINT 26 (MISCELLANEOUS) ×9 IMPLANT
CLAMP POUCH DRAINAGE QUIET (OSTOMY) ×3 IMPLANT
CLOTH BEACON ORANGE TIMEOUT ST (SAFETY) ×3 IMPLANT
COVER LIGHT HANDLE STERIS (MISCELLANEOUS) ×6 IMPLANT
COVER WAND RF STERILE (DRAPES) ×3 IMPLANT
DRAPE WARM FLUID 44X44 (DRAPES) ×3 IMPLANT
DRSG OPSITE POSTOP 4X10 (GAUZE/BANDAGES/DRESSINGS) ×3 IMPLANT
DRSG OPSITE POSTOP 4X8 (GAUZE/BANDAGES/DRESSINGS) ×3 IMPLANT
ELECT BLADE 6 FLAT ULTRCLN (ELECTRODE) ×3 IMPLANT
ELECT REM PT RETURN 9FT ADLT (ELECTROSURGICAL) ×3
ELECTRODE REM PT RTRN 9FT ADLT (ELECTROSURGICAL) ×2 IMPLANT
GLOVE BIOGEL M 6.5 STRL (GLOVE) ×3 IMPLANT
GLOVE BIOGEL M STRL SZ7.5 (GLOVE) ×3 IMPLANT
GLOVE BIOGEL PI IND STRL 6.5 (GLOVE) ×2 IMPLANT
GLOVE BIOGEL PI IND STRL 7.0 (GLOVE) ×8 IMPLANT
GLOVE BIOGEL PI IND STRL 7.5 (GLOVE) ×2 IMPLANT
GLOVE BIOGEL PI INDICATOR 6.5 (GLOVE) ×1
GLOVE BIOGEL PI INDICATOR 7.0 (GLOVE) ×4
GLOVE BIOGEL PI INDICATOR 7.5 (GLOVE) ×1
GLOVE SURG SS PI 7.5 STRL IVOR (GLOVE) ×9 IMPLANT
GOWN STRL REUS W/TWL LRG LVL3 (GOWN DISPOSABLE) ×12 IMPLANT
HANDLE SUCTION POOLE (INSTRUMENTS) ×2 IMPLANT
INST SET MAJOR GENERAL (KITS) ×3 IMPLANT
KIT TURNOVER KIT A (KITS) ×3 IMPLANT
LIGASURE IMPACT 36 18CM CVD LR (INSTRUMENTS) ×3 IMPLANT
MANIFOLD NEPTUNE II (INSTRUMENTS) ×3 IMPLANT
NEEDLE HYPO 18GX1.5 BLUNT FILL (NEEDLE) ×3 IMPLANT
NEEDLE HYPO 21X1.5 SAFETY (NEEDLE) ×3 IMPLANT
NS IRRIG 1000ML POUR BTL (IV SOLUTION) ×6 IMPLANT
PACK ABDOMINAL MAJOR (CUSTOM PROCEDURE TRAY) ×3 IMPLANT
PAD ARMBOARD 7.5X6 YLW CONV (MISCELLANEOUS) ×3 IMPLANT
POUCH OSTOMY 2 3/4  H 3804 (WOUND CARE)
POUCH OSTOMY 2 PC DRNBL 2.75 (WOUND CARE) IMPLANT
RELOAD LINEAR CUT PROX 55 BLUE (ENDOMECHANICALS) ×3 IMPLANT
RTRCTR WOUND ALEXIS 18CM MED (MISCELLANEOUS) ×3
SET BASIN LINEN APH (SET/KITS/TRAYS/PACK) ×3 IMPLANT
SPONGE LAP 18X18 RF (DISPOSABLE) ×9 IMPLANT
STAPLER PROXIMATE 55 BLUE (STAPLE) ×3 IMPLANT
STAPLER VISISTAT (STAPLE) ×3 IMPLANT
SUCTION POOLE HANDLE (INSTRUMENTS) ×3
SUT PDS AB 0 CTX 60 (SUTURE) ×6 IMPLANT
SUT SILK 3 0 SH CR/8 (SUTURE) ×3 IMPLANT
SYR 20ML LL LF (SYRINGE) ×6 IMPLANT
TRAY FOLEY MTR SLVR 16FR STAT (SET/KITS/TRAYS/PACK) ×3 IMPLANT

## 2020-03-26 NOTE — Plan of Care (Signed)
  Problem: Education: Goal: Knowledge of General Education information will improve Description: Including pain rating scale, medication(s)/side effects and non-pharmacologic comfort measures Outcome: Not Progressing   Problem: Health Behavior/Discharge Planning: Goal: Ability to manage health-related needs will improve Outcome: Not Progressing   Problem: Clinical Measurements: Goal: Respiratory complications will improve Outcome: Not Progressing   

## 2020-03-26 NOTE — Progress Notes (Signed)
Patient was intubated in OR at 23 at lip.  Came back to ICU05 and tube was at 28 at lip.  Moved tube to 25 and waiting for CXR to make sure placement is good.  Plan to draw ABG in about an hour after placing on ventilator to make sure settings are sufficient.  Will continue to monitor.

## 2020-03-26 NOTE — Progress Notes (Signed)
Patient moved himself to the end of the bed and called out for "water", while simultaneously requesting something for nausea.  Patient repeatedly stated that he could not move himself back up in the bed, but with boundary setting and encouragement, pt was able to use a walker stand and walk to the head of the bed. Due to reports of nausea pt provided mouth swabs at this time for his dry mouth.   Condom catheter placed at this time.  Patient has what appears to incontinence associated dermatitis in his gluteal fold.  The skin is bright red with superficial layer missing.  Skin is fragile and bleeds upon contact.  The area is not associated with pressure or a bony prominence.

## 2020-03-26 NOTE — Op Note (Signed)
Exploratory Laparotomy, Sigmoid Colectomy with End Colostomy.   Pre-operative Diagnosis: Septic shock, perforated viscus.   Post-operative Diagnosis: same, secondary to perforated sigmoid colon.   Surgeon: Campbell Lerner, M.D., FACS  Anesthesia: GETA   Findings: 2.8 L of green-black fibrinous/succus/serous fluid filling the entire abdominal cavity without loculation, without fibrinous exudative adhesive process.  Stomach and duodenum were without abnormality.  There were multiple diverticuli, with a mesenteric mass in the sigmoid mesocolon draining pus.  The entire small bowel was evaluated after lysing adhesions in the right lower quadrant and there was no evidence of any small bowel enterotomy or perforation.  No other colonic or upper GI intestinal perforation noted aside from the sigmoid disease.  Upon entering the abdomen (organ space), I encountered feculent peritonitis.  Estimated Blood Loss: 50 mL         Specimens: Sigmoid colon          Complications: none              Procedure Details  The patient was seen again in the Holding Room. The benefits, complications, treatment options, and expected outcomes were discussed with the patient. The risks of bleeding, infection, recurrence of symptoms, failure to resolve symptoms, unanticipated injury, prosthetic placement, prosthetic infection, any of which could require further surgery were reviewed with the patient. The likelihood of improving the patient's symptoms with return to their baseline status is guarded.  The patient and/or family concurred with the proposed plan, giving informed consent.  The patient was taken to Operating Room, identified and the procedure verified.    Prior to the induction of general anesthesia, antibiotic prophylaxis was administered. VTE prophylaxis was in place.  General anesthesia was then administered and tolerated well. After the induction, the patient was positioned in the supine position, a right IJ  central venous line was obtained, a Foley catheter was placed and the abdomen was prepped with  Chloraprep and draped in the sterile fashion.  A Time Out was held and the above information confirmed.  Midline incision was made, maintaining a supraumbilical limitation.  We entered into the peritoneal cavity and the usual cautious manner.  Identifying the extensive peritoneal fluid, with fibrinous green-brown contamination, not grossly malodorous inconsistent with a colonic/feculent source. Evaluation of the upper abdomen identified no abnormality of the anterior surfaces of the stomach and duodenum.  Stomach was palpated thoroughly identify no mass or ulceration present.  We then evaluated the small bowel from the ligament of Treitz to the ileocecal valve.  Multiple adhesions in the right lower quadrant were taken down to fully evaluate the distal ileum the cecum and the right colon.  The transverse colon was also unremarkable.  The sigmoid colon had evidence of marked diverticulosis, multiple foci of indurated diverticuli were noted noted.  There was a mesial colonic mass.  Pus draining from it.  Finding no other explanation for the intra-abdominal infection and the patient's ongoing septic shock, who proceeded with a sigmoid resection mobilizing the left colon and sigmoid colon.  The created a mesenteric window in the distal sigmoid colon mesentery, and divided the distal sigmoid with a 55 GIA stapler.  Then upon the sigmoid colon utilizing the LigaSure impact.  Mobilizing the distal left colon sufficiently to withdraw it out the abdominal wall for stoma creation. Then created a defect in the left abdominal wall via the rectus.  Dilated up to 2 fingers, divided the left colon from disease specimen with a second GIA stapler.  Specimen was then passed  off the field.  The distal colon was then brought through the abdominal wall.  It was secured to the fascia with 4 sutures of 2-0 Vicryl. I elected not to mature  the stoma at this time. After additional irrigation and aspiration utilizing multiple sequential large volumes of warm saline solution, proceeded with closure of the midline using a running 0 PDS.  I then loosely closed the skin, with staples, after washing the subcutaneous tissues.  Wound retractor had been utilized protecting the abdominal wall.  And hemostasis was confirmed prior to closure. Honeycomb dressing was applied to the midline incision, and a stomal appliance was placed to protect the stapled end of the colostomy for later maturation. During the procedure the patient had over 4 L of crystalloid, albumin was administered as well.  He was on norepinephrine vasopressor.  He remained cool and mottled throughout his lower extremities to his mid abdomen.  However the stoma remained clearly viable at closure.   His condition is guarded, spoke with his wife Crystal emphasizing the desperate nature of his septic shock at this time.  His condition was also discussed with Dr. Robb Matar the intensivist for the evening.     Campbell Lerner M.D., [facility] 03/12/2020 8:54 PM

## 2020-03-26 NOTE — Consult Note (Signed)
[facility] SURGICAL CONSULTATION NOTE (initial) - cpt: 11914)   HISTORY OF PRESENT ILLNESS (HPI):  49 y.o. male consulted in the [facility], ICU, room 5 today for evaluation of suspected perforated viscus, likely gastric. Patient reports minimal assistance/history as the patient is currently struggling to breathe on BiPAP.  Was admitted yesterday afternoon for acute exacerbation of COPD.  Became progressively tachypneic And tachycardic and was transferred from the floor to the ICU.  Developed complaints of abdominal pain today.  CT scan was obtained which showed a small location of free air, though the images are somewhat imperfect, it appears that it may be consistent with a gastric origin.  His white count had actually improved on admission, had been taking steroids previously.  Was on PPI/ulcer prophylaxis.  Surgery is consulted by physician Dr. Standley Dakins in this context for evaluation and management of perforated abdominal viscus.  PAST MEDICAL HISTORY (PMH):  Past Medical History:  Diagnosis Date  . Anemia   . CHF (congestive heart failure) (HCC)   . Chronic back pain   . Chronic pain syndrome   . Chronic pain syndrome   . Chronic sinusitis   . COPD (chronic obstructive pulmonary disease) (HCC)   . Coronary artery disease   . Dyslipidemia   . Dysphagia   . Epididymitis    Chlamydial, Right  . Erectile dysfunction   . Fatigue   . GERD (gastroesophageal reflux disease)   . Groin pain   . High cholesterol   . Hydrocele    bilateral  . Lumbar radiculopathy   . Nicotine addiction   . Peripheral vascular disease (HCC)   . Rectal bleeding   . Shortness of breath dyspnea   . Stroke (HCC) 2006   x 2     . Testicular pain      PAST SURGICAL HISTORY (PSH):  Past Surgical History:  Procedure Laterality Date  . APPENDECTOMY       MEDICATIONS:  Prior to Admission medications   Medication Sig Start Date End Date Taking? Authorizing Provider  albuterol  (PROVENTIL HFA;VENTOLIN HFA) 108 (90 Base) MCG/ACT inhaler Inhale 2 puffs into the lungs every 6 (six) hours as needed for wheezing or shortness of breath.   Yes [provider]  aspirin EC 81 MG tablet Take 81 mg by mouth daily.   Yes [provider]  atorvastatin (LIPITOR) 40 MG tablet Take 40 mg by mouth daily at 6 PM.  08/16/18  Yes [provider]  buPROPion (WELLBUTRIN XL) 150 MG 24 hr tablet Take 150 mg by mouth daily.   Yes [provider]  fenofibrate (TRICOR) 145 MG tablet Take 145 mg by mouth daily. 01/23/20  Yes [provider]  fluticasone (FLONASE) 50 MCG/ACT nasal spray Place 2 sprays into both nostrils daily. 02/09/20  Yes [provider]  furosemide (LASIX) 20 MG tablet Take 20 mg by mouth daily.  12/24/17  Yes [provider]  HYDROcodone-acetaminophen (NORCO) 10-325 MG tablet Take 1 tablet by mouth every 4 (four) hours as needed for pain. 07/02/19  Yes [provider]  ibuprofen (ADVIL) 800 MG tablet Take 800 mg by mouth 3 (three) times daily. 03/24/20  Yes [provider]  ipratropium-albuterol (DUONEB) 0.5-2.5 (3) MG/3ML SOLN Take 3 mLs by nebulization 3 (three) times daily.   Yes [provider]  LINZESS 290 MCG CAPS capsule Take 290 mcg by mouth daily. 05/04/19  Yes [provider]  methylPREDNISolone (MEDROL DOSEPAK) 4 MG TBPK tablet Take  by mouth. 03/24/20  Yes [provider]  omeprazole (PRILOSEC) 40 MG capsule Take 40 mg by mouth daily. 01/11/20  Yes [provider]  sildenafil (REVATIO) 20 MG tablet Take 5 tablets by mouth as needed. 02/22/18  Yes [provider]  STIOLTO RESPIMAT 2.5-2.5 MCG/ACT AERS Inhale 2 puffs into the lungs daily. 07/05/18  Yes [provider]  ezetimibe (ZETIA) 10 MG tablet Take 10 mg by mouth at bedtime. 03/17/19   [provider]  Fluticasone-Salmeterol (ADVAIR DISKUS) 250-50 MCG/DOSE AEPB Inhale 1 puff into the  lungs in the morning and at bedtime. Patient not taking: Reported on 03/19/2020 10/24/19   Burgess AmorIdol, Julie, PA-C  venlafaxine XR (EFFEXOR-XR) 75 MG 24 hr capsule Take 75 mg by mouth daily. 03/08/20   [provider]     ALLERGIES:  No Known Allergies   SOCIAL HISTORY:  Social History   Socioeconomic History  . Marital status: Married    Spouse name: Not on file  . Number of children: Not on file  . Years of education: Not on file  . Highest education level: Not on file  Occupational History  . Not on file  Tobacco Use  . Smoking status: Former Smoker    Packs/day: 0.00    Years: 31.00    Pack years: 0.00  . Smokeless tobacco: Never Used  . Tobacco comment: stopped smoking one month ago  Vaping Use  . Vaping Use: Never used  Substance and Sexual Activity  . Alcohol use: No    Alcohol/week: 0.0 standard drinks    Comment: no longer  . Drug use: No  . Sexual activity: Not Currently  Other Topics Concern  . Not on file  Social History Narrative  . Not on file   Social Determinants of Health   Financial Resource Strain:   . Difficulty of Paying Living Expenses: Not on file  Food Insecurity:   . Worried About Programme researcher, broadcasting/film/videounning Out of Food in the Last Year: Not on file  . Ran Out of Food in the Last Year: Not on file  Transportation Needs:   . Lack of Transportation (Medical): Not on file  . Lack of Transportation (Non-Medical): Not on file  Physical Activity:   . Days of Exercise per Week: Not on file  . Minutes of Exercise per Session: Not on file  Stress:   . Feeling of Stress : Not on file  Social Connections:   . Frequency of Communication with Friends and Family: Not on file  . Frequency of Social Gatherings with Friends and Family: Not on file  . Attends Religious Services: Not on file  . Active Member of Clubs or Organizations: Not on file  . Attends BankerClub or Organization Meetings: Not on file  . Marital Status: Not on file  Intimate Partner Violence:   . Fear  of Current or Ex-Partner: Not on file  . Emotionally Abused: Not on file  . Physically Abused: Not on file  . Sexually Abused: Not on file     FAMILY HISTORY:  Family History  Problem Relation Age of Onset  . Cancer Mother   . Cancer Father   . Colon cancer Neg Hx   . Colon polyps Neg Hx       REVIEW OF SYSTEMS:  Review of Systems  Unable to perform ROS: Severe respiratory distress    VITAL SIGNS:  Temp:  [97.7 F (36.5 C)-97.9 F (36.6 C)] 97.7 F (36.5 C) (10/31 1308) Pulse Rate:  [40-268]  139 (10/31 1530) Resp:  [12-42] 12 (10/31 1530) BP: (65-144)/(38-100) 110/38 (10/31 1530) SpO2:  [94 %-100 %] 99 % (10/31 1530) FiO2 (%):  [50 %] 50 % (10/31 1441) Weight:  [102.3 kg-103.2 kg] 103.2 kg (10/31 1000)     Height: 6' (182.9 cm) Weight: 103.2 kg BMI (Calculated): 30.85   INTAKE/OUTPUT:  10/30 0701 - 10/31 0700 In: 150 [IV Piggyback:150] Out: 150 [Urine:150]  PHYSICAL EXAM:  Physical Exam Blood pressure (!) 110/38, pulse (!) 139, temperature 97.7 F (36.5 C), temperature source Oral, resp. rate 12, height 6' (1.829 m), weight 103.2 kg, SpO2 99 %. Last Weight  Most recent update: 03/17/2020 10:30 AM   Weight  103.2 kg (227 lb 8.2 oz)            CONSTITUTIONAL: Well developed, and nourished, appropriately responsive and aware with distress.   EYES: Sclera non-icteric.   EARS, NOSE, MOUTH AND THROAT: BiPAP mask worn.   Hearing is intact to voice.  NECK: Trachea is midline, and there is no jugular venous distension.  LYMPH NODES:  Lymph nodes in the neck are not enlarged. RESPIRATORY:  Lungs are wheezing, yet equal bilaterally. With pathologic use of accessory muscles. CARDIOVASCULAR: Heart is tachycardic. GI: The abdomen is rigid, tender, and distended. There were no palpable masses. I did not appreciate hepatosplenomegaly.  Voluntary guarding with any attempt distracted palpation. MUSCULOSKELETAL: No deformity obvious in any of the 4 extremities.   Edema to dorsum  of right foot, ulcerations dressed on left ankle/achilles.  SKIN: Skin turgor is normal.   NEUROLOGIC: Nonfocal.   PSYCH:  Alert. Affect is appropriate for situation. Wants some broth...   Data Reviewed I have personally reviewed what is currently available of the patient's imaging, recent labs and medical records.    Labs:  CBC Latest Ref Rng & Units 03/15/2020 03/05/2020 03/10/2020  WBC 4.0 - 10.5 K/uL 15.8(H) 18.7(H) 24.8(H)  Hemoglobin 13.0 - 17.0 g/dL 12.9(L) 11.2(L) 13.3  Hematocrit 39 - 52 % 39.2 32.9(L) 39.6  Platelets 150 - 400 K/uL 260 224 316   CMP Latest Ref Rng & Units 03/11/2020 03/03/2020 03/10/2020  Glucose 70 - 99 mg/dL 924(Q) 683(M) 196(Q)  BUN 6 - 20 mg/dL 22(L) 79(G) 15  Creatinine 0.61 - 1.24 mg/dL 9.21 1.94(R) 7.40  Sodium 135 - 145 mmol/L 135 136 136  Potassium 3.5 - 5.1 mmol/L 3.2(L) 2.8(L) 3.2(L)  Chloride 98 - 111 mmol/L 97(L) 98 96(L)  CO2 22 - 32 mmol/L 22 27 27   Calcium 8.9 - 10.3 mg/dL 9.0 ) 9.4  Total Protein 6.5 - 8.1 g/dL 6.0(L) 6.0(L) -  Total Bilirubin 0.3 - 1.2 mg/dL 0.9 0.7 -  Alkaline Phos 38 - 126 U/L 103 114 -  AST 15 - 41 U/L 16 14(L) -  ALT 0 - 44 U/L 24 25 -     Imaging studies:  Last 24 hrs: CT ABDOMEN PELVIS WO CONTRAST  Result Date: 03/12/2020 CLINICAL DATA:  Abdominal distension. Concern for obstruction. Left lower quadrant tenderness and guarding. EXAM: CT ABDOMEN AND PELVIS WITHOUT CONTRAST TECHNIQUE: Multidetector CT imaging of the abdomen and pelvis was performed following the standard protocol without IV contrast. COMPARISON:  June 06, 2018 FINDINGS: Lower chest: There is atelectasis at versus consolidation at the lung bases, not well evaluated secondary to extensive respiratory motion artifact.The heart size is normal. Hepatobiliary: The liver is normal. The gallbladder is mildly distended. There is mild gallbladder wall thickening.There is no biliary ductal dilation. Pancreas: Normal contours without ductal  dilatation.  No peripancreatic fluid collection. Spleen: Unremarkable. Adrenals/Urinary Tract: --Adrenal glands: Unremarkable. --Right kidney/ureter: No hydronephrosis or radiopaque kidney stones. --Left kidney/ureter: There are punctate nonobstructing stones in the left kidney. --Urinary bladder: Unremarkable. Stomach/Bowel: --Stomach/Duodenum: There is a questionable defect in the wall of the greater curvature of the stomach, not well evaluated secondary to respiratory motion artifact (axial series 2, image 26). --Small bowel: Unremarkable. --Colon: There are scattered colonic diverticula without CT evidence for diverticulitis. There is questionable diffuse circumferential wall thickening throughout the transverse colon. Alternatively, this may be secondary to underdistention. --Appendix: Not visualized. No right lower quadrant inflammation or free fluid. Vascular/Lymphatic: Atherosclerotic calcification is present within the non-aneurysmal abdominal aorta, without hemodynamically significant stenosis. --No retroperitoneal lymphadenopathy. --No mesenteric lymphadenopathy. --No pelvic or inguinal lymphadenopathy. Reproductive: Unremarkable Other: There is a small to moderate volume of ascites. There is a small volume free air in the upper abdomen. There are bilateral fat containing inguinal hernias. Musculoskeletal. No acute displaced fractures. IMPRESSION: 1. Examination is significantly limited by respiratory motion artifact and lack of IV contrast. 2. Small volume free air in the upper abdomen, consistent with a perforated viscus in the absence of recent surgery. The exact site of perforation is not clearly identified on this exam, however there is a questionable defect in the wall of the greater curvature of the stomach, not well evaluated secondary to respiratory motion artifact. 3. Questionable diffuse circumferential wall thickening throughout the transverse colon. Alternatively, this may be secondary to underdistention.  Correlation for signs and symptoms of colitis is recommended. 4. Nonobstructive left nephrolithiasis. 5. Atelectasis at versus consolidation at the lung bases, not well evaluated secondary to extensive respiratory motion artifact. 6. Mild gallbladder wall thickening, likely reactive. If there is high clinical suspicion for a right upper quadrant process, follow-up with ultrasound is recommended. Aortic Atherosclerosis (ICD10-I70.0). These results were called by telephone at the time of interpretation on 03/13/2020 at 3:39 pm to provider [facility] , who verbally acknowledged these results. Electronically Signed   By: Katherine Mantle M.D.   On: 03/04/2020 15:39   DG CHEST PORT 1 VIEW  Result Date: 03/12/2020 CLINICAL DATA:  Respiratory failure EXAM: PORTABLE CHEST 1 VIEW COMPARISON:  March 25, 2020 FINDINGS: The cardiomediastinal silhouette is stable. No pneumothorax. The lungs remain clear. No other acute abnormalities. IMPRESSION: No active disease. Electronically Signed   By: Gerome Sam III M.D   On: 03/18/2020 09:17   DG Abd Portable 1V  Result Date: 03/11/2020 CLINICAL DATA:  Shortness of breath and productive cough. EXAM: PORTABLE ABDOMEN - 1 VIEW COMPARISON:  None. FINDINGS: The bowel gas pattern is normal. No radio-opaque calculi or other significant radiographic abnormality are seen. IMPRESSION: Negative. Electronically Signed   By: Gerome Sam III M.D   On: 03/20/2020 09:17   [place] EKG SITE RITE  Result Date: 02/26/2020 If Site Rite image not attached, placement could not be confirmed due to current cardiac rhythm.    Assessment/Plan:  49 y.o. male with potentially perforated stomach, complicated by pertinent comorbidities including: Marked COPD  Exacerbation.  Patient Active Problem List   Diagnosis Date Noted  . COPD with acute exacerbation (HCC) 03/10/2020  . Acute and chronic respiratory failure with hypoxia (HCC) 03/22/2020  . Leukocytosis 03/06/2020  .  Sinus tachycardia 03/01/2020  . Tachypnea 03/08/2020  . GERD (gastroesophageal reflux disease)   . Anemia 04/20/2018  . Chronic pain syndrome 04/20/2018  . Dyslipidemia 04/20/2018  . Vitamin D deficiency 04/20/2018  . Chronic low back  pain (Primary Area of Pain) (Bilateral) (L>R) w/ sciatica (Left) 04/20/2018  . Chronic lower extremity pain (Secondary Area of Pain) (Left) 04/20/2018  . Pharmacologic therapy 04/20/2018  . Disorder of skeletal system 04/20/2018  . Problems influencing health status 04/20/2018  . Long term current use of opiate analgesic 04/20/2018  . 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 Rectal bleeding 03/17/2018  . Dysphagia 03/17/2018  . Chronic low back pain (Midline) w/o sciatica 05/31/2015  . Back pain at L4-L5 level 02/15/2015  . DDD (degenerative disc disease), lumbosacral 02/15/2015  . Lumbar radiculopathy 02/15/2015  . Drug-seeking behavior 01/12/2015  . Bilateral hydrocele 01/12/2015  . Bilateral varicoceles 01/12/2015  . Orchitis and epididymitis 12/16/2014  . Epididymitis 11/29/2014  . Microscopic hematuria 11/29/2014    -We will initiate Cefotan,  -Proceed with emergent exploratory laparotomy.  -We will remain intubated postoperatively, remaining in the ICU.  - DVT prophylaxis  -GI prophylaxis.   -- Campbell Lerner, M.D., FACS 04/16/2020, 4:18 PM

## 2020-03-26 NOTE — Progress Notes (Signed)
Call made to Vascular access, process started for an associate to be on the way to place PICC. Will continue to monitor.

## 2020-03-26 NOTE — Progress Notes (Signed)
Spoke with [place] RN re [employer].  To call CVW to place PICC.

## 2020-03-26 NOTE — Anesthesia Postprocedure Evaluation (Signed)
Anesthesia Post Note  Patient: Manuel Richard  Procedure(s) Performed: EXPLORATORY LAPAROTOMY, PARTICAL COLECTOMY (N/A ) COLOSTOMY (Right )  Patient location during evaluation: ICU Anesthesia Type: General Level of consciousness: sedated Pain control: sedated. Vital Signs Assessment: post-procedure vital signs reviewed and stable Respiratory status: patient on ventilator - see flowsheet for VS : ventilated to ICU. Anesthetic complications: no   No complications documented.   Last Vitals:  Vitals:   03/01/2020 2100 03/25/2020 2110  BP: (!) 77/49   Pulse:  (!) 115  Resp: 14 (!) 21  Temp:  (!) 35.9 C  SpO2:  96%    Last Pain:  Vitals:   02/29/2020 1700  TempSrc: Oral  PainSc:                  [name]

## 2020-03-26 NOTE — Progress Notes (Signed)
Patient's wife called and said pt was calling and asking for her to bring him water and she was upset.  Explained to patient's wife that pt had been complaining of nausea and had an episode of vomiting before arriving on the unit.  That I had provided pt with mouth swabs to help with his dry mouth.  Wife verbalized understanding and agreed with plan of care.

## 2020-03-26 NOTE — Progress Notes (Signed)
PROGRESS NOTE   Manuel Richard  FAO:[id number] DOB: [birth year] DOA: 2020/04/04 PCP: Grayland Jack, FNP   Chief Complaint  Patient presents with  . Shortness of Breath   Brief Admission History:  49 y.o. male with medical history significant very severe COPD, longtime smoker with nicotine dependence, chronic pain, diastolic CHF, GERD, hyperlipidemia, supplemental oxygen dependent (2-4L/min), chronic dyspnea who had been followed by pulmonary clinic until he was dismissed from the practice due to belligerent behaviors.  He has been having a difficult time with chronic shortness of breath with acute exacerbations over the past month with multiple emergency department visits.  He was most recently seen in the emergency department on 1016 where he was treated for an acute exacerbation of COPD and sent home.  He returned to the emergency department today with symptoms of progressive shortness of breath difficulty speaking and poor exercise tolerance.  He has been having cough and shortness of breath with yellowish sputum production.  He also reports chronic leg edema which is mostly unchanged.  He says that his shortness of breath woke him up in the middle of the night.  He is not lying flat recumbent.  His main complaint is the shortness of breath.  He is breathing very fast.  He is adamant that he stopped smoking 1 month ago.  He has a 31+ year history of smoking up to 2 packs a day.  On 10/31 pt developed increasing respiratory distress on the med surg floor.  He was started on bipap therapy and transferred to stepdown ICU on bipap.  Wife confirmed that patient is full code.   Assessment & Plan:   Principal Problem:   COPD with acute exacerbation (HCC) Active Problems:   Drug-seeking behavior   Back pain at L4-L5 level   Dysphagia   Chronic pain syndrome   Dyslipidemia   Vitamin D deficiency   Chronic lower extremity pain (Secondary Area of Pain) (Left)   Long term current use of opiate  analgesic   GERD (gastroesophageal reflux disease)   Acute and chronic respiratory failure with hypoxia (HCC)   Leukocytosis   Sinus tachycardia   Tachypnea  1. Acute respiratory failure with hypoxia-secondary to COPD with acute exacerbation. Pt having increased respiratory distress.  Now on Bipap, transfer to stepdown ICU, increase solumedrol to 80 mg every 6 hours, IV doxycycline, scheduled duonebs, CXR reviewed stat and no acute findings. ABG after 1 hour on bipap.    2. Leukocytosis-suspect secondary to recent steroid use as he had received steroids and a tapered dose from recent ED visit.  Follow CBC. 3. GERD-Protonix IV ordered for GI protection. 4. Chronic pain-IV fentanyl ordered as needed.  5. Steroid induced hyperglycemia - SSI coverage, CBG monitoring and prandial coverage 6. Chronic constipation-resume home Linzess.  CT abdomen pending.  7. Chronic tobacco abuse-patient reports he quit 1 month ago but had smoked very heavily for many years multiple packs per day.  I have ordered a nicotine patch to use as needed for cravings. 8. Sinus tachycardia-suspect secondary to albuterol treatments and respiratory distress.  Follow on monitor.   9. Chronic respiratory failure-He is on supplemental oxygen at home but wife says hadn't been using all the time mostly at night. 10. Hypokalemia / Hypomagnesemia - replacements given and following.   DVT prophylaxis: Enoxaparin Code Status: Full Family Communication: wife updated by telephone, she confirmed full code status, asked for [place] to update her only  Disposition Plan: Home when medically stabilized Consults called:  N/A Admission status: INP  Status is: Inpatient  Remains inpatient appropriate because:Hemodynamically unstable, IV treatments appropriate due to intensity of illness or inability to take PO and Inpatient level of care appropriate due to severity of illness  Dispo: The patient is from: Home              Anticipated d/c is  to: Home              Anticipated d/c date is: > 3 days              Patient currently is not medically stable to d/c.  Consultants:   PCCM   Procedures:   bipap   Antimicrobials:  Doxycycline 10/30>>   Subjective: Pt having increasing SOB and complaining of abdominal pain   Objective: Vitals:   04-06-20 0500 04-06-2020 0748 04-06-2020 0754 04/06/20 0910  BP:      Pulse:    (!) 114  Resp: 20     Temp:      TempSrc:      SpO2:  96% 96% 100%  Weight:      Height:        Intake/Output Summary (Last 24 hours) at 04/06/20 0933 Last data filed at 04-06-2020 0400 Gross per 24 hour  Intake 150 ml  Output 150 ml  Net 0 ml   Filed Weights   02/27/2020 0717 04/06/2020 0300  Weight: 110 kg 102.3 kg   Examination:  General exam: chronically ill appearing male in respiratory distress, tachypneic, appears older than stated age.  Respiratory system: Increased work of breathing with accessory muscle use, tachypneic. Diffuse inspiratory and expiratory wheezing and poor air movement.  Cardiovascular system: normal S1 & S2 heard, tachycardic rate. Mild JVD, No murmurs, rubs, gallops or clicks. 2+ pedal edema bilateral LEs. Gastrointestinal system: Abdomen is obese, mildly distended, with LLQ tenderness and guarding. No organomegaly or masses felt. Normal bowel sounds heard. Central nervous system: Alert and oriented. No focal neurological deficits. Extremities: 1+ pretibial edema BLEs, 2+ pedal edema bilateral.  Symmetric 5 x 5 power. Skin: chronic venous stasis changes.   Psychiatry: Judgement and insight appear normal. Mood & affect appropriate.   Data Reviewed: I have personally reviewed following labs and imaging studies  CBC: Recent Labs  Lab 02/28/2020 0725 April 06, 2020 0646  WBC 18.7* 15.8*  NEUTROABS 15.8* 13.1*  HGB 11.2* 12.9*  HCT 32.9* 39.2  MCV 94.3 93.8  PLT 224 260    Basic Metabolic Panel: Recent Labs  Lab 03/12/2020 0725 04-06-20 0646  NA 136 135  K 2.8* 3.2*    CL 98 97*  CO2 27 22  GLUCOSE 137* 264*  BUN 25* 33*  CREATININE 0.60* 1.12  CALCIUM 8.5* 9.0  MG 1.5* 2.0   GFR: Estimated Creatinine Clearance: 99.8 mL/min (by C-G formula based on SCr of 1.12 mg/dL).  Liver Function Tests: Recent Labs  Lab 03/02/2020 0725 04/06/2020 0646  AST 14* 16  ALT 25 24  ALKPHOS 114 103  BILITOT 0.7 0.9  PROT 6.0* 6.0*  ALBUMIN 2.9* 2.9*   CBG: Recent Labs  Lab 03/06/2020 1704 03/23/2020 2250 April 06, 2020 0719  GLUCAP 165* 263* 273*    Recent Results (from the past 240 hour(s))  Respiratory Panel by RT PCR (Flu A&B, Covid) - Nasopharyngeal Swab     Status: None   Collection Time: 02/28/2020  7:56 AM   Specimen: Nasopharyngeal Swab  Result Value Ref Range Status   SARS Coronavirus 2 by RT PCR NEGATIVE NEGATIVE Final  Comment: (NOTE) SARS-CoV-2 target nucleic acids are NOT DETECTED.  The SARS-CoV-2 RNA is generally detectable in upper respiratoy specimens during the acute phase of infection. The lowest concentration of SARS-CoV-2 viral copies this assay can detect is 131 copies/mL. A negative result does not preclude SARS-Cov-2 infection and should not be used as the sole basis for treatment or other patient management decisions. A negative result may occur with  improper specimen collection/handling, submission of specimen other than nasopharyngeal swab, presence of viral mutation(s) within the areas targeted by this assay, and inadequate number of viral copies (<131 copies/mL). A negative result must be combined with clinical observations, patient history, and epidemiological information. The expected result is Negative.  Fact Sheet for Patients:  https://www.moore.com/  Fact Sheet for Healthcare Providers:  https://www.young.biz/  This test is no t yet approved or cleared by the [place] FDA and  has been authorized for detection and/or diagnosis of SARS-CoV-2 by FDA under an Emergency Use  Authorization (EUA). This EUA will remain  in effect (meaning this test can be used) for the duration of the COVID-19 declaration under Section 564(b)(1) of the Act, 21 U.S.C. section 360bbb-3(b)(1), unless the authorization is terminated or revoked sooner.     Influenza A by PCR NEGATIVE NEGATIVE Final   Influenza B by PCR NEGATIVE NEGATIVE Final    Comment: (NOTE) The Xpert Xpress SARS-CoV-2/FLU/RSV assay is intended as an aid in  the diagnosis of influenza from Nasopharyngeal swab specimens and  should not be used as a sole basis for treatment. Nasal washings and  aspirates are unacceptable for Xpert Xpress SARS-CoV-2/FLU/RSV  testing.  Fact Sheet for Patients: https://www.moore.com/  Fact Sheet for Healthcare Providers: https://www.young.biz/  This test is not yet approved or cleared by the [place] FDA and  has been authorized for detection and/or diagnosis of SARS-CoV-2 by  FDA under an Emergency Use Authorization (EUA). This EUA will remain  in effect (meaning this test can be used) for the duration of the  Covid-19 declaration under Section 564(b)(1) of the Act, 21  U.S.C. section 360bbb-3(b)(1), unless the authorization is  terminated or revoked. Performed at [facility], [address], [place], [place] [address]     Radiology Studies: DG Chest 2 View  Result Date: 10-Apr-2020 CLINICAL DATA:  Shortness of breath EXAM: CHEST - 2 VIEW COMPARISON:  03/10/2020 FINDINGS: Chronic interstitial prominence. Minimal basilar atelectasis/scarring. No pleural effusion. No pneumothorax. Stable cardiomediastinal contours. IMPRESSION: Stable chronic interstitial changes. Minimal basilar atelectasis/scarring. Electronically Signed   By: Guadlupe Spanish M.D.   On: 2020/04/10 07:54   CT Angio Chest PE W and/or Wo Contrast  Result Date: 2020-04-10 CLINICAL DATA:  Shortness of breath and productive cough. EXAM: CT ANGIOGRAPHY CHEST WITH CONTRAST  TECHNIQUE: Multidetector CT imaging of the chest was performed using the standard protocol during bolus administration of intravenous contrast. Multiplanar CT image reconstructions and MIPs were obtained to evaluate the vascular anatomy. CONTRAST:  47mL OMNIPAQUE IOHEXOL 350 MG/ML SOLN COMPARISON:  Chest x-ray 04-10-20. CT scan of the chest Oct 17, 2018. FINDINGS: Cardiovascular: Coronary artery calcifications are identified, particularly in the left coronary arteries. The heart size is unchanged. The thoracic aorta demonstrates no aneurysm, dissection, or significant atherosclerotic change. Evaluation of the pulmonary arteries for emboli is significantly limited due to respiratory motion. In fact, the study is nondiagnostic beyond the level of the main pulmonary arteries. Within the severe limitations, no emboli definitively seen. Mediastinum/Nodes: No enlarged mediastinal, hilar, or axillary lymph nodes. Thyroid  gland, trachea, and esophagus demonstrate no significant findings. Lungs/Pleura: Evaluation is significantly limited due to severe respiratory motion. Central airways are unremarkable other than some mucus in the right lower lobe bronchus. Mild paraseptal emphysema the right apex, unchanged. No pneumothorax identified. No definite focal infiltrate, nodule, or mass. Upper Abdomen: Bilateral adrenal nodularity is stable, likely adenomas. Musculoskeletal: Focal kyphosis is seen in the upper thoracic spine at T4-5, new since Oct 17, 2018 but otherwise age indeterminate. Severe loss of height is associated with both of these vertebral bodies consistent with age-indeterminate compression fractures. Review of the MIP images confirms the above findings. IMPRESSION: 1. Evaluation for pulmonary emboli is severely limited due to significant respiratory motion. The study is nondiagnostic beyond the level of the main pulmonary arteries. No emboli seen in the main pulmonary arteries. Repeat imaging could be  performed in the patient is better able to cooperate. 2. Focal kyphosis is seen at T4-5, new since Oct 17, 2018 but otherwise age indeterminate. Severe loss of height of these vertebral bodies is consistent with age-indeterminate compression fractures. Recommend clinical correlation. 3. Coronary artery calcified atherosclerosis, particularly in the left coronary arteries. 4. Evaluation of the lungs is severely limited due to respiratory motion without focal infiltrate, nodule, or mass identified. Electronically Signed   By: Gerome Samavid  Williams III M.D   On: 03/02/2020 09:23   DG CHEST PORT 1 VIEW  Result Date: Jul 06, 2019 CLINICAL DATA:  Respiratory failure EXAM: PORTABLE CHEST 1 VIEW COMPARISON:  March 25, 2020 FINDINGS: The cardiomediastinal silhouette is stable. No pneumothorax. The lungs remain clear. No other acute abnormalities. IMPRESSION: No active disease. Electronically Signed   By: Gerome Samavid  Williams III M.D   On: Jul 06, 2019 09:17   DG Abd Portable 1V  Result Date: Jul 06, 2019 CLINICAL DATA:  Shortness of breath and productive cough. EXAM: PORTABLE ABDOMEN - 1 VIEW COMPARISON:  None. FINDINGS: The bowel gas pattern is normal. No radio-opaque calculi or other significant radiographic abnormality are seen. IMPRESSION: Negative. Electronically Signed   By: Gerome Samavid  Williams III M.D   On: Jul 06, 2019 09:17   Scheduled Meds: . aspirin EC  81 mg Oral Daily  . atorvastatin  40 mg Oral q1800  . buPROPion  150 mg Oral Daily  . doxycycline  100 mg Oral Q12H  . enoxaparin (LOVENOX) injection  55 mg Subcutaneous Q24H  . fenofibrate  160 mg Oral Daily  . fluticasone  2 spray Each Nare Daily  . furosemide  20 mg Oral Daily  . guaiFENesin  1,200 mg Oral BID  . insulin aspart  0-20 Units Subcutaneous TID WC  . insulin aspart  0-5 Units Subcutaneous QHS  . insulin aspart  6 Units Subcutaneous TID WC  . insulin glargine  15 Units Subcutaneous Daily  . ipratropium-albuterol  3 mL Nebulization Q6H  .  linaclotide  290 mcg Oral Daily  . methylPREDNISolone (SOLU-MEDROL) injection  80 mg Intravenous Q6H  . mometasone-formoterol  2 puff Inhalation BID  . pantoprazole (PROTONIX) IV  40 mg Intravenous Q24H  . venlafaxine XR  75 mg Oral Daily   Continuous Infusions: . potassium chloride    . sodium chloride      LOS: 1 day   Critical Care Procedure Note Authorized and Performed by: Maryln Manuel. Janneth Krasner MD  Total Critical Care time:  63 minutes  Due to a high probability of clinically significant, life threatening deterioration, the patient required my highest level of preparedness to intervene emergently and I personally spent this critical care time directly and  personally managing the patient.  This critical care time included obtaining a history; examining the patient, pulse oximetry; ordering and review of studies; arranging urgent treatment with development of a management plan; evaluation of patient's response of treatment; frequent reassessment; and discussions with other providers.  This critical care time was performed to assess and manage the high probability of imminent and life threatening deterioration that could result in multi-organ failure.  It was exclusive of separately billable procedures and treating other patients and teaching time.   Standley Dakins, MD How to contact the [facility] Attending or Consulting provider 7A - 7P or covering provider during after hours 7P -7A, for this patient?  1. Check the care team in [facility] and look for a) attending/consulting TRH provider listed and b) the [facility] team listed 2. Log into [email or web address] and use [place]'s universal password to access. If you do not have the password, please contact the hospital operator. 3. Locate the [facility] provider you are looking for under [facility] and page to a number that you can be directly reached. 4. If you still have difficulty reaching the provider, please page the [facility] (Director on Call) for the Hospitalists listed  on amion for assistance.  19-Apr-2020, 9:33 AM

## 2020-03-26 NOTE — Progress Notes (Signed)
Pt's bed alarm was going off, pt had removed his condom catheter. When I asked pt why he removed he stated he didn't.  However condom catheter is in the floor away from the bed.  Pt requesting water, but continues to endorse feeling nauseous.  Provided pt with more mouth swabs and explained that if he drinks the water and proceeds to aspirate, I would not be helping him get better.

## 2020-03-26 NOTE — Progress Notes (Signed)
eLink Physician-Brief Progress Note Patient Name: Manuel Richard DOB: [birth year] MRN: [id number]   Date of Service  2020-03-27  HPI/Events of Note  Hospitalist requested camera evaluation for hypotension. Patient just returned from OR where he had an ex-lap for perforated viscus c/b feculent peritonitis and is now s/p sigmoid resection and ostomy creation.  He returns to the ICU on levophed at 17.5 mcg/min. Post-op labs pending. ABG is 7.28/42/78/BE -6.3. He is on VC 620x16, 5, 40%. He is overbreathing at 25/min. He has a history of COPD and there is evidence of incomplete exhalation at present RR despite short I-Time.   eICU Interventions  Start sedation (prop/fent ordered). Additional 1L LR bolus now. Target MAP 65 with levophed (can start weaning down given current MAP of 82). F/u post-op labs. Continue cefepime/flagyl for empiric treatment of peritonitis.     Intervention Category Major Interventions: Hypotension - evaluation and management  [name] 2020/03/27, 11:11 PM

## 2020-03-26 NOTE — Progress Notes (Addendum)
Attempted to place 63fr Zayven Powe catheter, met resistance and was unsuccessful. MD made aware. Informed they will place in the OR.  Informed consent obtained by patients spouse via 2 RN's by telephone consent.

## 2020-03-26 NOTE — Transfer of Care (Signed)
Immediate Anesthesia Transfer of Care Note  Patient: Manuel Richard  Procedure(s) Performed: EXPLORATORY LAPAROTOMY, PARTICAL COLECTOMY (N/A ) COLOSTOMY (Right )  Patient Location: PACU and ICU  Anesthesia Type:General  Level of Consciousness: sedated  Airway & Oxygen Therapy: Patient remains intubated per anesthesia plan  Post-op Assessment: Report given to RN  Post vital signs: Reviewed  Last Vitals:  Vitals Value Taken Time  BP 94/48 04-19-2020 2106  Temp 35.9 C 2020/04/19 2110  Pulse 115 April 19, 2020 2110  Resp 21 04-19-2020 2110  SpO2 96 % 19-Apr-2020 2110  Vitals shown include unvalidated device data.  Last Pain:  Vitals:   04-19-20 1700  TempSrc: Oral  PainSc:          Complications: No complications documented.

## 2020-03-26 NOTE — Progress Notes (Addendum)
Surgical team present at bedside to transport patient. Pt taken off bipap and placed on NRB mask for transport.

## 2020-03-26 NOTE — Progress Notes (Signed)
PICC RN obtained a long 20 Gauge IV in patient's left [facility] for extra access before surgery.

## 2020-03-26 NOTE — Anesthesia Procedure Notes (Addendum)
Arterial Line Insertion Start/End10/19/2021 8:00 PM, 03/26/2020 8:15 PM Performed by: Jillene Bucks, MD, anesthesiologist  Preanesthetic checklist: patient identified, IV checked, site marked, risks and benefits discussed, surgical consent, monitors and equipment checked, pre-op evaluation, timeout performed and anesthesia consent Left, radial was placed Catheter size: 20 G Hand hygiene performed , maximum sterile barriers used  and Seldinger technique used Allen's test indicative of satisfactory collateral circulation Attempts: 2 Procedure performed without using ultrasound guided technique. Ultrasound Notes:anatomy identified, needle tip was noted to be adjacent to the nerve/plexus identified and no ultrasound evidence of intravascular and/or intraneural injection Following insertion, dressing applied. Post procedure assessment: unchanged  Patient tolerated the procedure well with no immediate complications.

## 2020-03-26 NOTE — Progress Notes (Signed)
Pharmacy Antibiotic Note  Manuel Richard is a 49 y.o. male admitted on 03/14/2020 with intra abdominal infection.  Pharmacy has been consulted for cefepime dosing.  Plan: cefepime 2gm iv q8h  Height: 6' (182.9 cm) Weight: 103.2 kg (227 lb 8.2 oz) IBW/kg (Calculated) : 77.6  Temp (24hrs), Avg:97.5 F (36.4 C), Min:96.6 F (35.9 C), Max:97.9 F (36.6 C)  Recent Labs  Lab 03/11/2020 0725 04/17/20 0646  WBC 18.7* 15.8*  CREATININE 0.60* 1.12    Estimated Creatinine Clearance: 100.2 mL/min (by C-G formula based on SCr of 1.12 mg/dL).    No Known Allergies  Antimicrobials this admission: 10/31 cefepime >>  10/30 doxycycline >10/30  Microbiology results: 10/30 Covid/flu:negative 10/31 Wound abdomen: sent 10/31 MRSA PCR: negative   Thank you for allowing pharmacy to be a part of this patient's care.  Gerre Pebbles Vika Buske 2020/04/17 9:24 PM

## 2020-03-26 NOTE — ED Notes (Signed)
Gave patient his 0200 scheduled neb f28 , has wheezes. Patient wants to get up , wants water or something cold, RT upon leaving room patient vomits or throws something in floor not sure. Nurse notified

## 2020-03-26 NOTE — Progress Notes (Signed)
03/04/2020 4:22 PM  I received call from radiologist regarding CT results with findings of free air suspicious for gastric perforation.  I spoke with surgeon on call Dr. Claudine Mouton who agreed to come and see patient. I notified RN, charge nurse and [facility] to call in operative team.  I spoke with patient's wife and informed her patient patient going to OR. She verbalized understanding that patient likely will be intubated and remain in ICU post surgery.  Pt understands that he is going for surgery.   Critical Care Procedure Note Authorized and Performed by: Maryln Manuel MD  Total Critical Care time:  50 mins  Due to a high probability of clinically significant, life threatening deterioration, the patient required my highest level of preparedness to intervene emergently and I personally spent this critical care time directly and personally managing the patient.  This critical care time included obtaining a history; examining the patient, pulse oximetry; ordering and review of studies; arranging urgent treatment with development of a management plan; evaluation of patient's response of treatment; frequent reassessment; and discussions with other providers.  This critical care time was performed to assess and manage the high probability of imminent and life threatening deterioration that could result in multi-organ failure.  It was exclusive of separately billable procedures and treating other patients and teaching time.    Maryln Manuel MD  How to contact the [facility] Attending or Consulting provider 7A - 7P or covering provider during after hours 7P -7A, for this patient?  1. Check the care team in [facility] and look for a) attending/consulting TRH provider listed and b) the [facility] team listed 2. Log into [email or web address] and use [place]'s universal password to access. If you do not have the password, please contact the hospital operator. 3. Locate the [facility] provider you are looking for under [facility] and page to a  number that you can be directly reached. 4. If you still have difficulty reaching the provider, please page the [facility] (Director on Call) for the Hospitalists listed on amion for assistance.

## 2020-03-26 NOTE — Anesthesia Preprocedure Evaluation (Signed)
Anesthesia Evaluation  Patient identified by MRN, date of birth, ID band Patient confused    Reviewed: Allergy & Precautions, NPO status , Patient's Chart, lab work & pertinent test results, reviewed documented beta blocker date and time   Airway Mallampati: III  TM Distance: >3 FB Neck ROM: Full    Dental no notable dental hx.    Pulmonary shortness of breath, COPD,  COPD inhaler and oxygen dependent, former smoker,    Pulmonary exam normal breath sounds clear to auscultation       Cardiovascular + CAD and + Peripheral Vascular Disease  Normal cardiovascular exam Rhythm:Regular Rate:Normal     Neuro/Psych    GI/Hepatic GERD  ,  Endo/Other    Renal/GU      Musculoskeletal  (+) Arthritis ,   Abdominal   Peds  Hematology  (+) anemia ,   Anesthesia Other Findings   Reproductive/Obstetrics                             Anesthesia Physical Anesthesia Plan  ASA: IV  Anesthesia Plan: General   Post-op Pain Management:    Induction: Intravenous  PONV Risk Score and Plan:   Airway Management Planned:   Additional Equipment: Arterial line, CVP and Ultrasound Guidance Line Placement  Intra-op Plan:   Post-operative Plan: Post-operative intubation/ventilation  Informed Consent: I have reviewed the patients History and Physical, chart, labs and discussed the procedure including the risks, benefits and alternatives for the proposed anesthesia with the patient or authorized representative who has indicated his/her understanding and acceptance.     Dental advisory given  Plan Discussed with: CRNA  Anesthesia Plan Comments:         Anesthesia Quick Evaluation

## 2020-03-26 NOTE — Anesthesia Procedure Notes (Signed)
Central Venous Catheter Insertion Performed by: Jillene Bucks, MD, anesthesiologist Start/End10/20/2021 6:15 PM, 03/26/2020 6:30 PM Preanesthetic checklist: patient identified, IV checked, site marked, risks and benefits discussed, surgical consent, monitors and equipment checked, pre-op evaluation, timeout performed and anesthesia consent Position: Trendelenburg Hand hygiene performed , maximum sterile barriers used  and Seldinger technique used Catheter size: 8.5 Fr Central line was placed.Triple lumen Procedure performed using ultrasound guided technique. Ultrasound Notes:anatomy identified, needle tip was noted to be adjacent to the nerve/plexus identified, no ultrasound evidence of intravascular and/or intraneural injection and image(s) printed for medical record Attempts: 1 Following insertion, dressing applied and Biopatch. Post procedure assessment: blood return through all ports, free fluid flow and no air  Patient tolerated the procedure well with no immediate complications.

## 2020-03-27 ENCOUNTER — Inpatient Hospital Stay (HOSPITAL_COMMUNITY): Payer: Medicare Other

## 2020-03-27 DIAGNOSIS — A419 Sepsis, unspecified organism: Secondary | ICD-10-CM | POA: Diagnosis not present

## 2020-03-27 DIAGNOSIS — I5030 Unspecified diastolic (congestive) heart failure: Secondary | ICD-10-CM

## 2020-03-27 DIAGNOSIS — J9621 Acute and chronic respiratory failure with hypoxia: Secondary | ICD-10-CM | POA: Diagnosis not present

## 2020-03-27 DIAGNOSIS — R198 Other specified symptoms and signs involving the digestive system and abdomen: Secondary | ICD-10-CM

## 2020-03-27 DIAGNOSIS — R6521 Severe sepsis with septic shock: Secondary | ICD-10-CM

## 2020-03-27 DIAGNOSIS — J441 Chronic obstructive pulmonary disease with (acute) exacerbation: Secondary | ICD-10-CM

## 2020-03-27 LAB — COMPREHENSIVE METABOLIC PANEL
ALT: 2470 U/L — ABNORMAL HIGH (ref 0–44)
ALT: 2826 U/L — ABNORMAL HIGH (ref 0–44)
AST: 3983 U/L — ABNORMAL HIGH (ref 15–41)
AST: 4809 U/L — ABNORMAL HIGH (ref 15–41)
Albumin: 2 g/dL — ABNORMAL LOW (ref 3.5–5.0)
Albumin: 2.8 g/dL — ABNORMAL LOW (ref 3.5–5.0)
Alkaline Phosphatase: 56 U/L (ref 38–126)
Alkaline Phosphatase: 51 U/L (ref 38–126)
Anion gap: 9 (ref 5–15)
Anion gap: 10 (ref 5–15)
BUN: 38 mg/dL — ABNORMAL HIGH (ref 6–20)
BUN: 43 mg/dL — ABNORMAL HIGH (ref 6–20)
CO2: 20 mmol/L — ABNORMAL LOW (ref 22–32)
CO2: 20 mmol/L — ABNORMAL LOW (ref 22–32)
Calcium: 7.4 mg/dL — ABNORMAL LOW (ref 8.9–10.3)
Calcium: 8 mg/dL — ABNORMAL LOW (ref 8.9–10.3)
Chloride: 106 mmol/L (ref 98–111)
Chloride: 103 mmol/L (ref 98–111)
Creatinine, Ser: 2.27 mg/dL — ABNORMAL HIGH (ref 0.61–1.24)
Creatinine, Ser: 2.41 mg/dL — ABNORMAL HIGH (ref 0.61–1.24)
GFR, Estimated: 35 mL/min — ABNORMAL LOW (ref >60)
GFR, Estimated: 32 mL/min — ABNORMAL LOW (ref >60)
Glucose, Bld: 91 mg/dL (ref 70–99)
Glucose, Bld: 96 mg/dL (ref 70–99)
Potassium: 4.3 mmol/L (ref 3.5–5.1)
Potassium: 4.4 mmol/L (ref 3.5–5.1)
Sodium: 135 mmol/L (ref 135–145)
Sodium: 133 mmol/L — ABNORMAL LOW (ref 135–145)
Total Bilirubin: 0.9 mg/dL (ref 0.3–1.2)
Total Bilirubin: 1.4 mg/dL — ABNORMAL HIGH (ref 0.3–1.2)
Total Protein: 3.8 g/dL — ABNORMAL LOW (ref 6.5–8.1)
Total Protein: 4.4 g/dL — ABNORMAL LOW (ref 6.5–8.1)

## 2020-03-27 LAB — ECHOCARDIOGRAM COMPLETE
AR max vel: 4.67 cm2
AV Area VTI: 4.22 cm2
AV Area mean vel: 4.5 cm2
AV Mean grad: 4.9 mmHg
AV Peak grad: 8.5 mmHg
Ao pk vel: 1.46 m/s
Area-P 1/2: 8.72 cm2
Calc EF: 55.7 %
Height: 72 in
S' Lateral: 3.15 cm
Single Plane A2C EF: 55.6 %
Single Plane A4C EF: 55.7 %
Weight: 3904.79 oz

## 2020-03-27 LAB — CBC WITH DIFFERENTIAL/PLATELET
Band Neutrophils: 16 %
Basophils Absolute: 0 10*3/uL (ref 0.0–0.1)
Basophils Relative: 0 %
Eosinophils Absolute: 0 10*3/uL (ref 0.0–0.5)
Eosinophils Relative: 0 %
HCT: 26.6 % — ABNORMAL LOW (ref 39.0–52.0)
Hemoglobin: 8.7 g/dL — ABNORMAL LOW (ref 13.0–17.0)
Lymphocytes Relative: 13 %
Lymphs Abs: 1.3 10*3/uL (ref 0.7–4.0)
MCH: 31.3 pg (ref 26.0–34.0)
MCHC: 32.7 g/dL (ref 30.0–36.0)
MCV: 95.7 fL (ref 80.0–100.0)
Metamyelocytes Relative: 31 %
Monocytes Absolute: 0.2 10*3/uL (ref 0.1–1.0)
Monocytes Relative: 2 %
Myelocytes: 5 %
Neutro Abs: 4.9 10*3/uL (ref 1.7–7.7)
Neutrophils Relative %: 33 %
Platelets: 106 10*3/uL — ABNORMAL LOW (ref 150–400)
RBC: 2.78 MIL/uL — ABNORMAL LOW (ref 4.22–5.81)
RDW: 14.5 % (ref 11.5–15.5)
WBC: 9.9 10*3/uL (ref 4.0–10.5)
nRBC: 0.3 % — ABNORMAL HIGH (ref 0.0–0.2)

## 2020-03-27 LAB — MAGNESIUM: Magnesium: 1.8 mg/dL (ref 1.7–2.4)

## 2020-03-27 LAB — LACTIC ACID, PLASMA
Lactic Acid, Venous: 2.3 mmol/L (ref 0.5–1.9)
Lactic Acid, Venous: 2 mmol/L (ref 0.5–1.9)

## 2020-03-27 LAB — GLUCOSE, CAPILLARY
Glucose-Capillary: 102 mg/dL — ABNORMAL HIGH (ref 70–99)
Glucose-Capillary: 104 mg/dL — ABNORMAL HIGH (ref 70–99)
Glucose-Capillary: 75 mg/dL (ref 70–99)
Glucose-Capillary: 88 mg/dL (ref 70–99)
Glucose-Capillary: 93 mg/dL (ref 70–99)
Glucose-Capillary: 99 mg/dL (ref 70–99)

## 2020-03-27 LAB — TRIGLYCERIDES: Triglycerides: 69 mg/dL (ref <150)

## 2020-03-27 LAB — PROTIME-INR
INR: 2.7 — ABNORMAL HIGH (ref 0.8–1.2)
Prothrombin Time: 27.8 seconds — ABNORMAL HIGH (ref 11.4–15.2)

## 2020-03-27 MED ORDER — MIDAZOLAM HCL 2 MG/2ML IJ SOLN
2.0000 mg | INTRAMUSCULAR | Status: DC | PRN
  Administered 2020-03-27 – 2020-03-30 (×12): 2 mg via INTRAVENOUS
  Filled 2020-03-27 (×12): qty 2

## 2020-03-27 MED ORDER — SODIUM CHLORIDE 0.9 % IV SOLN
2.0000 g | Freq: Two times a day (BID) | INTRAVENOUS | Status: DC
  Administered 2020-03-27 – 2020-03-31 (×8): 2 g via INTRAVENOUS
  Filled 2020-03-27 (×8): qty 2

## 2020-03-27 MED ORDER — SODIUM CHLORIDE 0.9 % IV SOLN: INTRAVENOUS | Status: DC

## 2020-03-27 MED ORDER — INSULIN ASPART 100 UNIT/ML ~~LOC~~ SOLN: 0.0000 [IU] | SUBCUTANEOUS | Status: DC

## 2020-03-27 MED ORDER — MIDAZOLAM HCL 2 MG/2ML IJ SOLN
4.0000 mg | Freq: Once | INTRAMUSCULAR | Status: AC
  Administered 2020-03-27: 4 mg via INTRAVENOUS
  Filled 2020-03-27: qty 4

## 2020-03-27 MED ORDER — CHLORHEXIDINE GLUCONATE 0.12% ORAL RINSE (MEDLINE KIT)
15.0000 mL | Freq: Two times a day (BID) | OROMUCOSAL | Status: DC
  Administered 2020-03-27 – 2020-03-31 (×8): 15 mL via OROMUCOSAL

## 2020-03-27 MED ORDER — FENTANYL 2500MCG IN NS 250ML (10MCG/ML) PREMIX INFUSION
0.0000 ug/h | INTRAVENOUS | Status: DC
  Administered 2020-03-27: 200 ug/h via INTRAVENOUS
  Administered 2020-03-27: 25 ug/h via INTRAVENOUS
  Administered 2020-03-28 – 2020-03-29 (×3): 225 ug/h via INTRAVENOUS
  Administered 2020-03-29: 250 ug/h via INTRAVENOUS
  Administered 2020-03-30: 200 ug/h via INTRAVENOUS
  Administered 2020-03-30: 300 ug/h via INTRAVENOUS
  Administered 2020-03-31: 125 ug/h via INTRAVENOUS
  Filled 2020-03-27 (×9): qty 250

## 2020-03-27 MED ORDER — HYDROCORTISONE NA SUCCINATE PF 100 MG IJ SOLR
100.0000 mg | Freq: Two times a day (BID) | INTRAMUSCULAR | Status: DC
  Administered 2020-03-27 – 2020-03-28 (×2): 100 mg via INTRAVENOUS
  Filled 2020-03-27 (×2): qty 2

## 2020-03-27 MED ORDER — METHYLPREDNISOLONE SODIUM SUCC 40 MG IJ SOLR
40.0000 mg | Freq: Four times a day (QID) | INTRAMUSCULAR | Status: DC
  Administered 2020-03-27: 40 mg via INTRAVENOUS
  Filled 2020-03-27: qty 1

## 2020-03-27 MED ORDER — ORAL CARE MOUTH RINSE
15.0000 mL | OROMUCOSAL | Status: DC
  Administered 2020-03-27 – 2020-03-31 (×38): 15 mL via OROMUCOSAL

## 2020-03-27 MED ORDER — LACTATED RINGERS IV SOLN: INTRAVENOUS | Status: DC

## 2020-03-27 MED ORDER — SODIUM CHLORIDE 0.9 % IV BOLUS
1000.0000 mL | Freq: Once | INTRAVENOUS | Status: AC
  Administered 2020-03-27: 1000 mL via INTRAVENOUS

## 2020-03-27 MED ORDER — LACTATED RINGERS IV BOLUS
1000.0000 mL | Freq: Once | INTRAVENOUS | Status: AC
  Administered 2020-03-27: 1000 mL via INTRAVENOUS

## 2020-03-27 MED ORDER — INSULIN ASPART 100 UNIT/ML ~~LOC~~ SOLN
0.0000 [IU] | SUBCUTANEOUS | Status: DC
  Administered 2020-03-28 – 2020-03-30 (×5): 1 [IU] via SUBCUTANEOUS

## 2020-03-27 MED ORDER — SODIUM CHLORIDE 0.9% IV SOLUTION: Freq: Once | INTRAVENOUS | Status: AC

## 2020-03-27 NOTE — Progress Notes (Signed)
Manuel Richard:[id number] DOB: 10/30/1970 DOA: 04-17-20  PCP: Grayland Jack, FNP    Chief Complaint: Postop exploratory laparotomy with sigmoid resection and colostomy placement.  HPI: Manuel Richard is a 49 y.o. male with below past medical history that was admitted by Dr. Standley Dakins on 04/17/20 afternoon due to acute respiratory failure with hypoxia secondary to COPD with acute exacerbation.  Head CT was obtained due to constipation and abdominal pain demonstrated free air.  General surgery was consulted and Dr. Claudine Mouton proceeded with emergent exploratory laparotomy.  He had a partial sigmoidectomy with left colostomy placement.  He remained intubated postoperatively.  Once transferred to the ICU, the patient was hypotensive, but responded to IVF and norepinephrine infusion.  He is currently sedated and in no acute distress.  Review of Systems: Unable to obtain  Past Medical History:  Diagnosis Date  . Anemia   . CHF (congestive heart failure) (HCC)   . Chronic back pain   . Chronic pain syndrome   . Chronic pain syndrome   . Chronic sinusitis   . COPD (chronic obstructive pulmonary disease) (HCC)   . Coronary artery disease   . Dyslipidemia   . Dysphagia   . Epididymitis    Chlamydial, Right  . Erectile dysfunction   . Fatigue   . GERD (gastroesophageal reflux disease)   . Groin pain   . High cholesterol   . Hydrocele    bilateral  . Lumbar radiculopathy   . Nicotine addiction   . Peripheral vascular disease (HCC)   . Rectal bleeding   . Shortness of breath dyspnea   . Stroke, other nonhemorrhagic (HCC) 2006   x 2     . Testicular pain    Past Surgical History:  Procedure Laterality Date  . APPENDECTOMY     Social History  reports that he has quit smoking. He smoked 0.00 packs per day for 31.00 years. He has never used smokeless tobacco. He reports that he does not drink alcohol and does not use drugs.  No Known Allergies  Family History   Problem Relation Age of Onset  . Cancer Mother   . Cancer Father   . Colon cancer Neg Hx   . Colon polyps Neg Hx    Prior to Admission medications   Medication Sig Start Date End Date Taking? Authorizing Provider  albuterol (PROVENTIL HFA;VENTOLIN HFA) 108 (90 Base) MCG/ACT inhaler Inhale 2 puffs into the lungs every 6 (six) hours as needed for wheezing or shortness of breath.   Yes [provider]  aspirin EC 81 MG tablet Take 81 mg by mouth daily.   Yes [provider]  atorvastatin (LIPITOR) 40 MG tablet Take 40 mg by mouth daily at 6 PM.  08/16/18  Yes [provider]  buPROPion (WELLBUTRIN XL) 150 MG 24 hr tablet Take 150 mg by mouth daily.   Yes [provider]  fenofibrate (TRICOR) 145 MG tablet Take 145 mg by mouth daily. 01/23/20  Yes [provider]  fluticasone (FLONASE) 50 MCG/ACT nasal spray Place 2 sprays into both nostrils daily. 02/09/20  Yes [provider]  furosemide (LASIX) 20 MG tablet Take 20 mg by mouth daily.  12/24/17  Yes [provider]  HYDROcodone-acetaminophen (NORCO) 10-325 MG tablet Take 1 tablet by mouth every 4 (four) hours as needed for pain. 07/02/19  Yes [provider]  ibuprofen (ADVIL) 800 MG tablet Take 800 mg by mouth 3 (three) times daily. 03/24/20  Yes  [provider]  ipratropium-albuterol (DUONEB) 0.5-2.5 (3) MG/3ML SOLN Take 3 mLs by nebulization 3 (three) times daily.   Yes [provider]  LINZESS 290 MCG CAPS capsule Take 290 mcg by mouth daily. 05/04/19  Yes [provider]  methylPREDNISolone (MEDROL DOSEPAK) 4 MG TBPK tablet Take by mouth. 03/24/20  Yes [provider]  omeprazole (PRILOSEC) 40 MG capsule Take 40 mg by mouth daily. 01/11/20  Yes [provider]  sildenafil (REVATIO) 20 MG tablet Take 5 tablets by mouth as needed. 02/22/18  Yes [provider]  STIOLTO RESPIMAT 2.5-2.5 MCG/ACT AERS Inhale 2 puffs into the  lungs daily. 07/05/18  Yes [provider]  ezetimibe (ZETIA) 10 MG tablet Take 10 mg by mouth at bedtime. 03/17/19   [provider]  Fluticasone-Salmeterol (ADVAIR DISKUS) 250-50 MCG/DOSE AEPB Inhale 1 puff into the lungs in the morning and at bedtime. Patient not taking: Reported on 02/29/2020 10/24/19   Burgess Amor, PA-C  venlafaxine XR (EFFEXOR-XR) 75 MG 24 hr capsule Take 75 mg by mouth daily. 03/08/20   [provider]   Physical Exam: Vitals:   03/07/2020 2057 03/16/2020 2100 03/12/2020 2110 03/21/2020 2200  BP: (!) 81/58 (!) 80/48  (!) 111/53  Pulse:  (!) 115 (!) 115 (!) 109  Resp:  16 (!) 21 17  Temp:   (!) 96.6 F (35.9 C)   TempSrc:      SpO2:  97% 96% 94%  Weight:      Height:       Constitutional: Intubated, in NAD. Eyes: PERRL, lids and conjunctivae mildly injected. ENMT: ETT in place.  Mucous membranes are very dry. Posterior pharynx clear of any exudate or lesions. Neck: normal, supple, no masses, no thyromegaly Respiratory: clear to auscultation bilaterally, no wheezing, no crackles. Normal respiratory effort. No accessory muscle use.  Cardiovascular: Tachycardic in the 101 tens with a regular rhythm, no murmurs / rubs / gallops. No extremity edema. 2+ pedal pulses. No carotid bruits.  Abdomen: Midline surgical incision and dressing.  Left-sided colostomy.  Please see pictures below.   ZJ:QBHAL catheter in place with no urine drainage yet. Musculoskeletal: no clubbing / cyanosis. Good ROM, no contractures. Normal muscle tone.  Skin: no rashes, lesions, ulcers on limited dermatological examination. Neurologic: CN 2-12 grossly intact. Sensation intact, DTR normal. Strength 5/5 in all 4.  Psychiatric: Normal judgment and insight. Alert and oriented x 3. Normal mood.             CBC: Recent Labs  Lab 03/12/2020 0725 03/06/2020 0646  WBC 18.7* 15.8*  NEUTROABS 15.8* 13.1*  HGB 11.2* 12.9*  HCT 32.9* 39.2  MCV 94.3 93.8  PLT 224 260     Basic Metabolic Panel: Recent Labs  Lab 03/19/2020 0725 02/26/2020 0646  NA 136 135  K 2.8* 3.2*  CL 98 97*  CO2 27 22  GLUCOSE 137* 264*  BUN 25* 33*  CREATININE 0.60* 1.12  CALCIUM 8.5* 9.0  MG 1.5* 2.0   GFR: Estimated Creatinine Clearance: 100.2 mL/min (by C-G formula based on SCr of 1.12 mg/dL).  Liver Function Tests: Recent Labs  Lab 02/27/2020 0725 03/08/2020 0646  AST 14* 16  ALT 25 24  ALKPHOS 114 103  BILITOT 0.7 0.9  PROT 6.0* 6.0*  ALBUMIN 2.9* 2.9*   Radiological Exams on Admission: CT ABDOMEN PELVIS WO CONTRAST  Result Date: 03/21/2020 CLINICAL DATA:  Abdominal distension. Concern for obstruction. Left lower quadrant tenderness and guarding. EXAM: CT ABDOMEN AND PELVIS  WITHOUT CONTRAST TECHNIQUE: Multidetector CT imaging of the abdomen and pelvis was performed following the standard protocol without IV contrast. COMPARISON:  June 06, 2018 FINDINGS: Lower chest: There is atelectasis at versus consolidation at the lung bases, not well evaluated secondary to extensive respiratory motion artifact.The heart size is normal. Hepatobiliary: The liver is normal. The gallbladder is mildly distended. There is mild gallbladder wall thickening.There is no biliary ductal dilation. Pancreas: Normal contours without ductal dilatation. No peripancreatic fluid collection. Spleen: Unremarkable. Adrenals/Urinary Tract: --Adrenal glands: Unremarkable. --Right kidney/ureter: No hydronephrosis or radiopaque kidney stones. --Left kidney/ureter: There are punctate nonobstructing stones in the left kidney. --Urinary bladder: Unremarkable. Stomach/Bowel: --Stomach/Duodenum: There is a questionable defect in the wall of the greater curvature of the stomach, not well evaluated secondary to respiratory motion artifact (axial series 2, image 26). --Small bowel: Unremarkable. --Colon: There are scattered colonic diverticula without CT evidence for diverticulitis. There is questionable diffuse  circumferential wall thickening throughout the transverse colon. Alternatively, this may be secondary to underdistention. --Appendix: Not visualized. No right lower quadrant inflammation or free fluid. Vascular/Lymphatic: Atherosclerotic calcification is present within the non-aneurysmal abdominal aorta, without hemodynamically significant stenosis. --No retroperitoneal lymphadenopathy. --No mesenteric lymphadenopathy. --No pelvic or inguinal lymphadenopathy. Reproductive: Unremarkable Other: There is a small to moderate volume of ascites. There is a small volume free air in the upper abdomen. There are bilateral fat containing inguinal hernias. Musculoskeletal. No acute displaced fractures. IMPRESSION: 1. Examination is significantly limited by respiratory motion artifact and lack of IV contrast. 2. Small volume free air in the upper abdomen, consistent with a perforated viscus in the absence of recent surgery. The exact site of perforation is not clearly identified on this exam, however there is a questionable defect in the wall of the greater curvature of the stomach, not well evaluated secondary to respiratory motion artifact. 3. Questionable diffuse circumferential wall thickening throughout the transverse colon. Alternatively, this may be secondary to underdistention. Correlation for signs and symptoms of colitis is recommended. 4. Nonobstructive left nephrolithiasis. 5. Atelectasis at versus consolidation at the lung bases, not well evaluated secondary to extensive respiratory motion artifact. 6. Mild gallbladder wall thickening, likely reactive. If there is high clinical suspicion for a right upper quadrant process, follow-up with ultrasound is recommended. Aortic Atherosclerosis (ICD10-I70.0). These results were called by telephone at the time of interpretation on 03/04/2020 at 3:39 pm to provider [facility] , who verbally acknowledged these results. Electronically Signed   By: Katherine Mantlehristopher  Green M.D.    On: 02/29/2020 15:39   DG Chest 2 View  Result Date: 2019-12-06 CLINICAL DATA:  Shortness of breath EXAM: CHEST - 2 VIEW COMPARISON:  03/10/2020 FINDINGS: Chronic interstitial prominence. Minimal basilar atelectasis/scarring. No pleural effusion. No pneumothorax. Stable cardiomediastinal contours. IMPRESSION: Stable chronic interstitial changes. Minimal basilar atelectasis/scarring. Electronically Signed   By: Guadlupe SpanishPraneil  Patel M.D.   On: 2019-12-06 07:54   CT Angio Chest PE W and/or Wo Contrast  Result Date: 2019-12-06 CLINICAL DATA:  Shortness of breath and productive cough. EXAM: CT ANGIOGRAPHY CHEST WITH CONTRAST TECHNIQUE: Multidetector CT imaging of the chest was performed using the standard protocol during bolus administration of intravenous contrast. Multiplanar CT image reconstructions and MIPs were obtained to evaluate the vascular anatomy. CONTRAST:  75mL OMNIPAQUE IOHEXOL 350 MG/ML SOLN COMPARISON:  Chest x-ray March 25, 2020. CT scan of the chest Oct 17, 2018. FINDINGS: Cardiovascular: Coronary artery calcifications are identified, particularly in the left coronary arteries. The heart size is unchanged. The thoracic aorta demonstrates no  aneurysm, dissection, or significant atherosclerotic change. Evaluation of the pulmonary arteries for emboli is significantly limited due to respiratory motion. In fact, the study is nondiagnostic beyond the level of the main pulmonary arteries. Within the severe limitations, no emboli definitively seen. Mediastinum/Nodes: No enlarged mediastinal, hilar, or axillary lymph nodes. Thyroid gland, trachea, and esophagus demonstrate no significant findings. Lungs/Pleura: Evaluation is significantly limited due to severe respiratory motion. Central airways are unremarkable other than some mucus in the right lower lobe bronchus. Mild paraseptal emphysema the right apex, unchanged. No pneumothorax identified. No definite focal infiltrate, nodule, or mass. Upper  Abdomen: Bilateral adrenal nodularity is stable, likely adenomas. Musculoskeletal: Focal kyphosis is seen in the upper thoracic spine at T4-5, new since Oct 17, 2018 but otherwise age indeterminate. Severe loss of height is associated with both of these vertebral bodies consistent with age-indeterminate compression fractures. Review of the MIP images confirms the above findings. IMPRESSION: 1. Evaluation for pulmonary emboli is severely limited due to significant respiratory motion. The study is nondiagnostic beyond the level of the main pulmonary arteries. No emboli seen in the main pulmonary arteries. Repeat imaging could be performed in the patient is better able to cooperate. 2. Focal kyphosis is seen at T4-5, new since Oct 17, 2018 but otherwise age indeterminate. Severe loss of height of these vertebral bodies is consistent with age-indeterminate compression fractures. Recommend clinical correlation. 3. Coronary artery calcified atherosclerosis, particularly in the left coronary arteries. 4. Evaluation of the lungs is severely limited due to respiratory motion without focal infiltrate, nodule, or mass identified. Electronically Signed   By: Gerome Sam III M.D   On: 02/25/2020 09:23   DG CHEST PORT 1 VIEW  Result Date: 03/10/2020 CLINICAL DATA:  Respiratory failure EXAM: PORTABLE CHEST 1 VIEW COMPARISON:  March 25, 2020 FINDINGS: The cardiomediastinal silhouette is stable. No pneumothorax. The lungs remain clear. No other acute abnormalities. IMPRESSION: No active disease. Electronically Signed   By: Gerome Sam III M.D   On: 03/15/2020 09:17   DG Abd Portable 1V  Result Date: 03/13/2020 CLINICAL DATA:  Shortness of breath and productive cough. EXAM: PORTABLE ABDOMEN - 1 VIEW COMPARISON:  None. FINDINGS: The bowel gas pattern is normal. No radio-opaque calculi or other significant radiographic abnormality are seen. IMPRESSION: Negative. Electronically Signed   By: Gerome Sam III M.D    On: 03/20/2020 09:17   [place] EKG SITE RITE  Result Date: 02/29/2020 If Site Rite image not attached, placement could not be confirmed due to current cardiac rhythm.  Assessment/Plan Severe sepsis with septic shock (HCC) With multiorgan dysfunction syndrome due to feculent peritonitis. Status post exploratory laparotomy with partial sigmoid resection S/p left colostomy creation Continue IV fluids. Continue pressors. Continue mechanical ventilation. Continue IV antibiotics. Postop care per surgery. PCCM consulted for vent management. Prognosis at this time is guarded.  Bobette Mo MD [facility]  How to contact the [facility] Attending or Consulting provider 7A - 7P or covering provider during after hours 7P -7A, for this patient?   1. Check the care team in [facility] and look for a) attending/consulting [facility] provider listed and b) the [facility] team listed 2. Log into [email or web address] and use [place]'s universal password to access. If you do not have the password, please contact the hospital operator. 3. Locate the [facility] provider you are looking for under [facility] and page to a number that you can be directly reached. 4. If you still have difficulty reaching the provider, please page the  DOC (Director on Call) for the Hospitalists listed on amion for assistance.  04-04-20, 10:45 PM   This document was created using Dragon voice recognition software and may contain some unintended transcription errors.  About 70 minutes of critical care time were spent during the care of these emergent events.

## 2020-03-27 NOTE — Progress Notes (Addendum)
PROGRESS NOTE   Manuel Richard  ZOX:[id number] DOB: 19-May-1971 DOA: 03/20/2020 PCP: Grayland Jack, FNP   Chief Complaint  Patient presents with  . Shortness of Breath   Brief Admission History:  49 y.o. male with medical history significant very severe COPD, longtime smoker with nicotine dependence, chronic pain, diastolic CHF, GERD, hyperlipidemia, supplemental oxygen dependent (2-4L/min), chronic dyspnea who had been followed by pulmonary clinic until he was dismissed from the practice due to belligerent behaviors.  He has been having a difficult time with chronic shortness of breath with acute exacerbations over the past month with multiple emergency department visits.  He was most recently seen in the emergency department on 1016 where he was treated for an acute exacerbation of COPD and sent home.  He returned to the emergency department today with symptoms of progressive shortness of breath difficulty speaking and poor exercise tolerance.  He has been having cough and shortness of breath with yellowish sputum production.  He also reports chronic leg edema which is mostly unchanged.  He says that his shortness of breath woke him up in the middle of the night.  He is not lying flat recumbent.  His main complaint is the shortness of breath.  He is breathing very fast.  He is adamant that he stopped smoking 1 month ago.  He has a 31+ year history of smoking up to 2 packs a day.  On 10/31 Manuel Richard developed increasing respiratory distress on the med surg floor.  He was started on bipap therapy and transferred to stepdown ICU on bipap.  Wife confirmed that patient is full code.   Assessment & Plan:   Principal Problem:   COPD with acute exacerbation (HCC) Active Problems:   Perforation of viscus   Drug-seeking behavior   Back pain at L4-L5 level   Dysphagia   Chronic pain syndrome   Dyslipidemia   Vitamin D deficiency   Chronic lower extremity pain (Secondary Area of Pain) (Left)   Long  term current use of opiate analgesic   GERD (gastroesophageal reflux disease)   Acute and chronic respiratory failure with hypoxia (HCC)   Leukocytosis   Sinus tachycardia   Tachypnea   Severe sepsis with septic shock (HCC)  1. Severe sepsis on admission with septic shock following admission secondary to abdominal infection/feculent peritonitis - continue supportive measures and broad spectrum antibiotics with cefepime and metronidazole. Follow cultures.   2. POD#1 s/p exploratory lap with lysis of adhesions, sigmoidectomy with left colostomy placement - Manuel Richard remains critically ill on pressor therapy with IV norepinephrine. He is NPO.  Will discuss starting TNA with surgery team.  He has central line placed 10/31.  3. Oliguria - bladder scans with no urine found.  Foley flushed. Add maintenance IV fluid for now.  Follow.  4. Acute respiratory failure with hypoxia-secondary to COPD with acute exacerbation - Manuel Richard remains intubated.  He is on IV steroids which I am weaning down.  5. AKI - Adding IV fluids.  Avoid nephrotoxins.  Follow CMP.  6. Elevated LFTs - Possibly from liver shock or hepatorenal syndrome.    7. Leukocytosis-WBC trending down. Follow CBC with diff. 8. Acute blood loss anemia - Follow CBC and tranfuse as need for Hg<8 given heart disease history.  9. Diastolic CHF - 2D echocardiogram ordered.   10. Thrombocytopenia - likely due to severe sepsis, monitor closely for bleeding. Follow CBC. DC enoxaparin.  11. GERD-Protonix IV ordered for GI protection. 12. Chronic pain-IV fentanyl ordered as  needed.  13. Steroid induced hyperglycemia - SSI coverage, CBG monitoring and prandial coverage 14. Chronic constipation-He said he had been taking Linzess.  He is now on docusate suspension.   15. Chronic tobacco abuse-patient reports he quit 1 month ago but had smoked very heavily for many years multiple packs per day.  I have ordered a nicotine patch to use as needed for  cravings. 16. Hypokalemia / Hypomagnesemia - replacements given and following.   DVT prophylaxis: INR>3 Code Status: Full Family Communication: wife updated by telephone, she confirmed full code status, asked for [place] to update her only  Disposition Plan: Home when medically stabilized Consults called: PCCM, Surgery Admission status: INP  Status is: Inpatient  Remains inpatient appropriate because:Hemodynamically unstable, IV treatments appropriate due to intensity of illness or inability to take PO and Inpatient level of care appropriate due to severity of illness  Dispo: The patient is from: Home              Anticipated d/c is to: Home              Anticipated d/c date is: > 3 days              Patient currently is not medically stable to d/c.  Consultants:   PCCM   Procedures:   bipap   Antimicrobials:  Doxycycline 10/30>>   Subjective: Manuel Richard sedated on vent in ICU.  Urine output minimal.   Objective: Vitals:   03/27/20 0400 03/27/20 0500 03/27/20 0600 03/27/20 0743  BP: 131/80  116/77   Pulse: 89 (!) 112 (!) 102   Resp: (!) 22 (!) 26 19   Temp:  (!) 97.4 F (36.3 C)  98.6 F (37 C)  TempSrc:  Axillary    SpO2: 100% 96% 96%   Weight:   110.7 kg   Height:   6' (1.829 m)     Intake/Output Summary (Last 24 hours) at 03/27/2020 0900 Last data filed at 03/27/2020 0600 Gross per 24 hour  Intake 7471.48 ml  Output 100 ml  Net 7371.48 ml   Filed Weights   03/18/2020 0300 03/01/2020 1000 03/27/20 0600  Weight: 102.3 kg 103.2 kg 110.7 kg   Examination:  General exam: sedated and intubated in ICU. Chronically ill appearing male, critically ill, appears older than stated age.  Respiratory system: intubated and mechanically ventilated good air movement bilateral with no wheezes or rhonchi heard.  Cardiovascular system: right sided IJ central line in place.  Normal S1 & S2 heard, Mild JVD, No murmurs, rubs, gallops or clicks. 2+ pedal edema bilateral LEs. Gastrointestinal  system: Abdomen is obese, distended, ostomy appears viable, no BS heard, incisional wounds clean and dry.  Central nervous system: sedated on vent.  Extremities: 1+ pretibial edema BLEs, 2+ pedal edema bilateral.  Skin: chronic venous stasis changes.   Psychiatry: sedated on vent in ICU.   Data Reviewed: I have personally reviewed following labs and imaging studies  CBC: Recent Labs  Lab 2020-03-30 0725 03/10/2020 0646 03/06/2020 2245 03/27/20 0508  WBC 18.7* 15.8* 6.0 9.9  NEUTROABS 15.8* 13.1* 4.9 4.9  HGB 11.2* 12.9* 9.7* 8.7*  HCT 32.9* 39.2 30.2* 26.6*  MCV 94.3 93.8 98.4 95.7  PLT 224 260 119* 106*    Basic Metabolic Panel: Recent Labs  Lab 03-30-2020 0725 03/21/2020 0646 03/05/2020 2245 03/27/20 0508  NA 136 135 135 133*  K 2.8* 3.2* 4.3 4.4  CL 98 97* 106 103  CO2 27 22 20* 20*  GLUCOSE  137* 264* 91 96  BUN 25* 33* 38* 43*  CREATININE 0.60* 1.12 2.27* 2.41*  CALCIUM 8.5* 9.0 7.4* 8.0*  MG 1.5* 2.0 2.1 1.8  PHOS  --   --  6.7*  --    GFR: Estimated Creatinine Clearance: 48.1 mL/min (A) (by C-G formula based on SCr of 2.41 mg/dL (H)).  Liver Function Tests: Recent Labs  Lab 03/20/2020 0725 04-16-2020 0646 04-16-2020 2245 03/27/20 0508  AST 14* 16 3,983* PENDING  ALT 25 24 2,470* PENDING  ALKPHOS 114 103 56 51  BILITOT 0.7 0.9 0.9 1.4*  PROT 6.0* 6.0* 3.8* 4.4*  ALBUMIN 2.9* 2.9* 2.0* 2.8*   CBG: Recent Labs  Lab Apr 16, 2020 1106 April 16, 2020 1614 03/27/20 0003 03/27/20 0445 03/27/20 0738  GLUCAP 322* 170* 75 88 93    Recent Results (from the past 240 hour(s))  Respiratory Panel by RT PCR (Flu A&B, Covid) - Nasopharyngeal Swab     Status: None   Collection Time: 03/05/2020  7:56 AM   Specimen: Nasopharyngeal Swab  Result Value Ref Range Status   SARS Coronavirus 2 by RT PCR NEGATIVE NEGATIVE Final    Comment: (NOTE) SARS-CoV-2 target nucleic acids are NOT DETECTED.  The SARS-CoV-2 RNA is generally detectable in upper respiratoy specimens during the acute phase  of infection. The lowest concentration of SARS-CoV-2 viral copies this assay can detect is 131 copies/mL. A negative result does not preclude SARS-Cov-2 infection and should not be used as the sole basis for treatment or other patient management decisions. A negative result may occur with  improper specimen collection/handling, submission of specimen other than nasopharyngeal swab, presence of viral mutation(s) within the areas targeted by this assay, and inadequate number of viral copies (<131 copies/mL). A negative result must be combined with clinical observations, patient history, and epidemiological information. The expected result is Negative.  Fact Sheet for Patients:  https://www.moore.com/  Fact Sheet for Healthcare Providers:  https://www.young.biz/  This test is no t yet approved or cleared by the [place] FDA and  has been authorized for detection and/or diagnosis of SARS-CoV-2 by FDA under an Emergency Use Authorization (EUA). This EUA will remain  in effect (meaning this test can be used) for the duration of the COVID-19 declaration under Section 564(b)(1) of the Act, 21 U.S.C. section 360bbb-3(b)(1), unless the authorization is terminated or revoked sooner.     Influenza A by PCR NEGATIVE NEGATIVE Final   Influenza B by PCR NEGATIVE NEGATIVE Final    Comment: (NOTE) The Xpert Xpress SARS-CoV-2/FLU/RSV assay is intended as an aid in  the diagnosis of influenza from Nasopharyngeal swab specimens and  should not be used as a sole basis for treatment. Nasal washings and  aspirates are unacceptable for Xpert Xpress SARS-CoV-2/FLU/RSV  testing.  Fact Sheet for Patients: https://www.moore.com/  Fact Sheet for Healthcare Providers: https://www.young.biz/  This test is not yet approved or cleared by the [place] FDA and  has been authorized for detection and/or diagnosis of  SARS-CoV-2 by  FDA under an Emergency Use Authorization (EUA). This EUA will remain  in effect (meaning this test can be used) for the duration of the  Covid-19 declaration under Section 564(b)(1) of the Act, 21  U.S.C. section 360bbb-3(b)(1), unless the authorization is  terminated or revoked. Performed at [facility], [address], [place], [place] [address]   MRSA PCR Screening     Status: None   Collection Time: 04-16-2020 10:33 AM   Specimen: Nasal Mucosa; Nasopharyngeal  Result Value Ref  Range Status   MRSA by PCR NEGATIVE NEGATIVE Final    Comment:        The GeneXpert MRSA Assay (FDA approved for NASAL specimens only), is one component of a comprehensive MRSA colonization surveillance program. It is not intended to diagnose MRSA infection nor to guide or monitor treatment for MRSA infections. Performed at [facility], [address], [place], [place] [address]     Radiology Studies: CT ABDOMEN PELVIS WO CONTRAST  Result Date: 03/21/2020 CLINICAL DATA:  Abdominal distension. Concern for obstruction. Left lower quadrant tenderness and guarding. EXAM: CT ABDOMEN AND PELVIS WITHOUT CONTRAST TECHNIQUE: Multidetector CT imaging of the abdomen and pelvis was performed following the standard protocol without IV contrast. COMPARISON:  June 06, 2018 FINDINGS: Lower chest: There is atelectasis at versus consolidation at the lung bases, not well evaluated secondary to extensive respiratory motion artifact.The heart size is normal. Hepatobiliary: The liver is normal. The gallbladder is mildly distended. There is mild gallbladder wall thickening.There is no biliary ductal dilation. Pancreas: Normal contours without ductal dilatation. No peripancreatic fluid collection. Spleen: Unremarkable. Adrenals/Urinary Tract: --Adrenal glands: Unremarkable. --Right kidney/ureter: No hydronephrosis or radiopaque kidney stones. --Left kidney/ureter: There are punctate nonobstructing stones in the left  kidney. --Urinary bladder: Unremarkable. Stomach/Bowel: --Stomach/Duodenum: There is a questionable defect in the wall of the greater curvature of the stomach, not well evaluated secondary to respiratory motion artifact (axial series 2, image 26). --Small bowel: Unremarkable. --Colon: There are scattered colonic diverticula without CT evidence for diverticulitis. There is questionable diffuse circumferential wall thickening throughout the transverse colon. Alternatively, this may be secondary to underdistention. --Appendix: Not visualized. No right lower quadrant inflammation or free fluid. Vascular/Lymphatic: Atherosclerotic calcification is present within the non-aneurysmal abdominal aorta, without hemodynamically significant stenosis. --No retroperitoneal lymphadenopathy. --No mesenteric lymphadenopathy. --No pelvic or inguinal lymphadenopathy. Reproductive: Unremarkable Other: There is a small to moderate volume of ascites. There is a small volume free air in the upper abdomen. There are bilateral fat containing inguinal hernias. Musculoskeletal. No acute displaced fractures. IMPRESSION: 1. Examination is significantly limited by respiratory motion artifact and lack of IV contrast. 2. Small volume free air in the upper abdomen, consistent with a perforated viscus in the absence of recent surgery. The exact site of perforation is not clearly identified on this exam, however there is a questionable defect in the wall of the greater curvature of the stomach, not well evaluated secondary to respiratory motion artifact. 3. Questionable diffuse circumferential wall thickening throughout the transverse colon. Alternatively, this may be secondary to underdistention. Correlation for signs and symptoms of colitis is recommended. 4. Nonobstructive left nephrolithiasis. 5. Atelectasis at versus consolidation at the lung bases, not well evaluated secondary to extensive respiratory motion artifact. 6. Mild gallbladder wall  thickening, likely reactive. If there is high clinical suspicion for a right upper quadrant process, follow-up with ultrasound is recommended. Aortic Atherosclerosis (ICD10-I70.0). These results were called by telephone at the time of interpretation on 03/10/2020 at 3:39 pm to provider [facility] , who verbally acknowledged these results. Electronically Signed   By: Katherine Mantle M.D.   On: 02/27/2020 15:39   CT Angio Chest PE W and/or Wo Contrast  Result Date: 04-07-2020 CLINICAL DATA:  Shortness of breath and productive cough. EXAM: CT ANGIOGRAPHY CHEST WITH CONTRAST TECHNIQUE: Multidetector CT imaging of the chest was performed using the standard protocol during bolus administration of intravenous contrast. Multiplanar CT image reconstructions and MIPs were obtained to evaluate the vascular anatomy. CONTRAST:  40mL OMNIPAQUE  IOHEXOL 350 MG/ML SOLN COMPARISON:  Chest x-ray April 23, 2020. CT scan of the chest Oct 17, 2018. FINDINGS: Cardiovascular: Coronary artery calcifications are identified, particularly in the left coronary arteries. The heart size is unchanged. The thoracic aorta demonstrates no aneurysm, dissection, or significant atherosclerotic change. Evaluation of the pulmonary arteries for emboli is significantly limited due to respiratory motion. In fact, the study is nondiagnostic beyond the level of the main pulmonary arteries. Within the severe limitations, no emboli definitively seen. Mediastinum/Nodes: No enlarged mediastinal, hilar, or axillary lymph nodes. Thyroid gland, trachea, and esophagus demonstrate no significant findings. Lungs/Pleura: Evaluation is significantly limited due to severe respiratory motion. Central airways are unremarkable other than some mucus in the right lower lobe bronchus. Mild paraseptal emphysema the right apex, unchanged. No pneumothorax identified. No definite focal infiltrate, nodule, or mass. Upper Abdomen: Bilateral adrenal nodularity is  stable, likely adenomas. Musculoskeletal: Focal kyphosis is seen in the upper thoracic spine at T4-5, new since Oct 17, 2018 but otherwise age indeterminate. Severe loss of height is associated with both of these vertebral bodies consistent with age-indeterminate compression fractures. Review of the MIP images confirms the above findings. IMPRESSION: 1. Evaluation for pulmonary emboli is severely limited due to significant respiratory motion. The study is nondiagnostic beyond the level of the main pulmonary arteries. No emboli seen in the main pulmonary arteries. Repeat imaging could be performed in the patient is better able to cooperate. 2. Focal kyphosis is seen at T4-5, new since Oct 17, 2018 but otherwise age indeterminate. Severe loss of height of these vertebral bodies is consistent with age-indeterminate compression fractures. Recommend clinical correlation. 3. Coronary artery calcified atherosclerosis, particularly in the left coronary arteries. 4. Evaluation of the lungs is severely limited due to respiratory motion without focal infiltrate, nodule, or mass identified. Electronically Signed   By: Gerome Sam III M.D   On: 04-23-2020 09:23   DG CHEST PORT 1 VIEW  Result Date: 03/10/2020 CLINICAL DATA:  ET tube repositioning EXAM: PORTABLE CHEST 1 VIEW COMPARISON:  03/04/2020 FINDINGS: Endotracheal tube is 4.5 cm above the carina. Right central line has been placed with the tip in the SVC. No pneumothorax. Heart is normal size. Increasing bibasilar opacities, left greater than right. No effusions. IMPRESSION: Endotracheal tube is 4.5 cm above the carina. Increasing bibasilar atelectasis or infiltrates, left greater than right. Electronically Signed   By: Charlett Nose M.D.   On: 03/05/2020 23:10   DG CHEST PORT 1 VIEW  Result Date: 03/22/2020 CLINICAL DATA:  Respiratory failure EXAM: PORTABLE CHEST 1 VIEW COMPARISON:  04/23/2020 FINDINGS: The cardiomediastinal silhouette is stable. No  pneumothorax. The lungs remain clear. No other acute abnormalities. IMPRESSION: No active disease. Electronically Signed   By: Gerome Sam III M.D   On: 02/29/2020 09:17   DG Abd Portable 1V  Result Date: 03/08/2020 CLINICAL DATA:  Shortness of breath and productive cough. EXAM: PORTABLE ABDOMEN - 1 VIEW COMPARISON:  None. FINDINGS: The bowel gas pattern is normal. No radio-opaque calculi or other significant radiographic abnormality are seen. IMPRESSION: Negative. Electronically Signed   By: Gerome Sam III M.D   On: 03/04/2020 09:17   [place] EKG SITE RITE  Result Date: 02/25/2020 If Site Rite image not attached, placement could not be confirmed due to current cardiac rhythm.  Scheduled Meds: . aspirin EC  81 mg Oral Daily  . buPROPion  150 mg Oral Daily  . Chlorhexidine Gluconate Cloth  6 each Topical Daily  . docusate  100 mg Per Tube BID  . enoxaparin (LOVENOX) injection  55 mg Subcutaneous Q24H  . fluticasone  2 spray Each Nare Daily  . furosemide  30 mg Intravenous Daily  . insulin aspart  0-20 Units Subcutaneous Q4H  . insulin glargine  15 Units Subcutaneous Daily  . levalbuterol  0.63 mg Nebulization Q6H  . methylPREDNISolone (SOLU-MEDROL) injection  80 mg Intravenous Q6H  . mometasone-formoterol  2 puff Inhalation BID  . pantoprazole (PROTONIX) IV  40 mg Intravenous Q24H  . polyethylene glycol  17 g Per Tube Daily  . venlafaxine XR  75 mg Oral Daily   Continuous Infusions: . albumin human Stopped (03/27/20 0406)  . ceFEPime (MAXIPIME) IV    . fentaNYL infusion INTRAVENOUS 200 mcg/hr (03/27/20 0600)  . lactated ringers Stopped (03/27/20 0020)  . metronidazole 500 mg (03/27/20 0544)  . norepinephrine (LEVOPHED) Adult infusion 7.5 mcg/min (03/27/20 0600)    LOS: 2 days   Critical Care Procedure Note Authorized and Performed by: Maryln Manuel. Shellby Schlink MD  Total Critical Care time:  50 minutes  Due to a high probability of clinically significant, life threatening deterioration,  the patient required my highest level of preparedness to intervene emergently and I personally spent this critical care time directly and personally managing the patient.  This critical care time included obtaining a history; examining the patient, pulse oximetry; ordering and review of studies; arranging urgent treatment with development of a management plan; evaluation of patient's response of treatment; frequent reassessment; and discussions with other providers.  This critical care time was performed to assess and manage the high probability of imminent and life threatening deterioration that could result in multi-organ failure.  It was exclusive of separately billable procedures and treating other patients and teaching time.   Standley Dakinslanford Nicolle Heward, MD How to contact the [facility] Attending or Consulting provider 7A - 7P or covering provider during after hours 7P -7A, for this patient?  1. Check the care team in [facility] and look for a) attending/consulting [facility] provider listed and b) the [facility] team listed 2. Log into [email or web address] and use [place]'s universal password to access. If you do not have the password, please contact the hospital operator. 3. Locate the [facility] provider you are looking for under [facility] and page to a number that you can be directly reached. 4. If you still have difficulty reaching the provider, please page the [facility] (Director on Call) for the Hospitalists listed on amion for assistance.  03/27/2020, 9:00 AM

## 2020-03-27 NOTE — Progress Notes (Signed)
Physician(s) notified of <5 mL of urine output.  Foley has been irrigated and the same amount of irrigant used was returned. -and- Bladder scan was completed and multiple scans yielded (0) mL.   Levophed has been weaned as tolerated.

## 2020-03-27 NOTE — Progress Notes (Signed)
Initial Nutrition Assessment  DOCUMENTATION CODES:   Obesity unspecified  INTERVENTION:  When medically appropriate recommend consider trickle feeds via OGT-  -Vital High Protein @ 20 ml/hr (provides 480 kcal, 42 gr protein)  -Add ProSource TF 90 ml -TID (provides 240 kcal, 66 gr protein)   Total regimen would provide: 720 kcal, 108 gr protein - 54% energy, 68% protein needs.  If pt not unable to tolerate enteral suggest initiation of TPN   NUTRITION DIAGNOSIS:   Inadequate oral intake related to inability to eat as evidenced by NPO status.   GOAL:   Provide needs based on ASPEN/SCCM guidelines  MONITOR:   Vent status, Labs, TF tolerance, I & O's, Skin, Weight trends  REASON FOR ASSESSMENT:   Ventilator    ASSESSMENT: Patient is a 49 yo male with history of severe COPD, CHF, GERD and chronic dyspnea. Presented with shortness of breath.   Patient is currently intubated on ventilator support. He is s/p exploratory laparotomy, partial colectomy- colostomy. Pressor support-weaned off now. Plans to open unmatured end colostomy on 11/3 per surgery.   MV: 12.3 L/min Temp (24hrs), Avg:97.9 F (36.6 C), Min:97.3 F (36.3 C), Max:98.9 F (37.2 C)  Fentanyl: sedation      Intake/Output Summary (Last 24 hours) at 03/28/2020 1140 Last data filed at 03/28/2020 1119 Gross per 24 hour  Intake 4093.55 ml  Output 850 ml  Net 3243.55 ml     Medications reviewed and include: Colace, Insulin, Lasix, Solumedrol, Protonix, Miralax.  IV- NS @ 150 ml/hr Albumin (50 gr) q 6 hrs Maxipime, Flagyl  Levophed-weaned off   Labs: BMP Latest Ref Rng & Units 03/28/2020 03/27/2020 03/10/2020  Glucose 70 - 99 mg/dL 665(L) 96 91  BUN 6 - 20 mg/dL 93(T) 70(V) 77(L)  Creatinine 0.61 - 1.24 mg/dL 3.90(Z) 0.09(Q) 3.30(Q)  BUN/Creat Ratio 9 - 20 - - -  Sodium 135 - 145 mmol/L 137 133(L) 135  Potassium 3.5 - 5.1 mmol/L 4.8 4.4 4.3  Chloride 98 - 111 mmol/L 107 103 106  CO2 22 - 32 mmol/L  19(L) 20(L) 20(L)  Calcium 8.9 - 10.3 mg/dL 8.2(L) 8.0(L) 7.4(L)     NUTRITION - FOCUSED PHYSICAL EXAM:  Unable to complete Nutrition-Focused physical exam at this time.    Diet Order:   Diet Order            Diet NPO time specified  Diet effective now                 EDUCATION NEEDS:  Not appropriate for education at this time   Skin:  Skin Assessment: Skin Integrity Issues: Skin Integrity Issues:: Incisions Incisions: abdomen-  BLE pitting edema.  Last BM:  New ostomy- 0 ml output  Height:   Ht Readings from Last 1 Encounters:  03/28/20 6' (1.829 m)    Weight:   Wt Readings from Last 1 Encounters:  03/27/20 110.7 kg    Ideal Body Weight:   81 kg  BMI:  Body mass index is 33.1 kg/m.  Estimated Nutritional Needs:   Kcal:  7622-6333  Protein:  160-178 gr  Fluid:  per MD goal   Royann Shivers MS,RD,CSG,LDN Pager: Loretha Stapler

## 2020-03-27 NOTE — Progress Notes (Signed)
Patient acknowledges location (with appropriate nod) and follows directions with sedation off. BP increases as well.

## 2020-03-27 NOTE — Consult Note (Addendum)
NAME:  Manuel Richard, MRN:  [id number], DOB:  March 19, 1971, LOS: 2 ADMISSION DATE:  02/27/2020, CONSULTATION DATE: 03/27/20 (emergency basis as discharged from pulmonary clinic for beligerent behavior)  REFERRING MD:  Wynetta Emery, [facility] , CHIEF COMPLAINT:  GI sepsis, resp failure   Brief History   52 yowm former smoker with extensive pmhx admit 10/30 with apparent aecopd then developed abd pain 10/31 >  Per viscus on >>> returned on vent  ex-lap for perforated sigmoid c/b feculent peritonitis and is now s/p sigmoid resection and ostomy creation with circulatory shock, met acidosis and decreased uop on vent for resp failure and PCCM service asked to consult 11/1   History of present illness   From epic notes:  49 y.o. male with medical history significant very severe COPD, longtime smoker with nicotine dependence, chronic pain, diastolic CHF, GERD, hyperlipidemia, supplemental oxygen dependent (2-4L/min), chronic dyspnea who had been followed by pulmonary clinic until he was dismissed from the practice due to belligerent behaviors.  He has been having a difficult time with chronic shortness of breath with acute exacerbations over the past month with multiple emergency department visits.  He was most recently seen in the emergency department on 10/16 where he was treated for an acute exacerbation of COPD and sent home.  He returned to the emergency department today with symptoms of progressive shortness of breath difficulty speaking and poor exercise tolerance.  He has been having cough and shortness of breath with yellowish sputum production.  He also reports chronic leg edema which is mostly unchanged.  He says that his shortness of breath woke him up in the middle of the night.  He is not lying flat recumbent.  His main complaint is the shortness of breath.  He is breathing very fast.  He is adamant that he stopped smoking 1 month ago.  He has a 31+ year history of smoking up to 2 packs a day.  ED Course:  The patient was tachycardic on arrival with a heart rate in the 120 range.  Patient was tachypneic with a respiratory rate of 21.  Temperature 98.6.  Pulse ox 99% on 3 L nasal cannula.  Blood pressure 111/90.  Patient was noted to have hypokalemia with a potassium of 2.8.  BUN 25 creatinine 0.60 calcium 8.5 albumin 2.9 magnesium 1.5 total protein 6.0 AST 14 ALT 25.  Cardiac BNP 73.0.  WBC 18.7.  Hemoglobin 11.2.  Platelet count 224.  Influenza a and B testing negative SARS 2 coronavirus test negative.  Chest x-ray no acute findings.  CT angio chest nondiagnostic beyond main pulmonary arteries due to poor quality study.  EKG with findings of sinus tachycardia.  Patient was given continuous nebulizer treatments.  Patient was given IV magnesium and potassium.  The patient was monitored in the emergency department for several hours however continued to have tachypnea and tachycardia.  He continues to have shortness of breath symptoms.  Admission was requested for further evaluation and management COPD exacerbation acute on chronic respiratory failure with hypoxia.   Past Medical History    Anemia, CHF (congestive heart failure) (HCC), Chronic back pain, Chronic pain syndrome, Chronic pain syndrome, Chronic sinusitis, COPD (chronic obstructive pulmonary disease) (no pfts in [place])  Coronary artery disease, Dyslipidemia, Dysphagia, Epididymitis, Erectile dysfunction, Fatigue, GERD (gastroesophageal reflux disease), Groin pain, High cholesterol, Hydrocele, Lumbar radiculopathy, Nicotine addiction, Peripheral vascular disease ([place]), Rectal bleeding, Shortness of breath dyspnea, Stroke ([place]) (2006), and Testicular pain.   Significant Hospital Events  OR pm 10/31: feculent peritonitis/ sigmoid perf   Consults:  Gen surgery  10/31 >>> PCCM   11/1 >>>  Procedures:  R IJ  10/31     >>> Oral et 10/31 >>> L brachial art line 10/31 >>>   Significant Diagnostic Tests:    Micro Data:  COVID 19  10/31 PCR  neg MRSA  10/31   PCR  Neg  Abd wound 10/31 >>>  Antimicrobials:  Doxy 10/30 only Cefepime 10/31 >>> Flagyl  10/31 >>>   Scheduled Meds: . sodium chloride   Intravenous Once  . buPROPion  150 mg Oral Daily  . chlorhexidine gluconate (MEDLINE KIT)  15 mL Mouth Rinse BID  . Chlorhexidine Gluconate Cloth  6 each Topical Daily  . docusate  100 mg Per Tube BID  . fluticasone  2 spray Each Nare Daily  . furosemide  30 mg Intravenous Daily  . insulin aspart  0-9 Units Subcutaneous Q4H  . levalbuterol  0.63 mg Nebulization Q6H  . mouth rinse  15 mL Mouth Rinse 10 times per day  . methylPREDNISolone (SOLU-MEDROL) injection  40 mg Intravenous Q6H  . mometasone-formoterol  2 puff Inhalation BID  . pantoprazole (PROTONIX) IV  40 mg Intravenous Q24H  . polyethylene glycol  17 g Per Tube Daily  . venlafaxine XR  75 mg Oral Daily   Continuous Infusions: . sodium chloride 150 mL/hr at 03/27/20 1401  . albumin human Stopped (03/27/20 1038)  . ceFEPime (MAXIPIME) IV    . fentaNYL infusion INTRAVENOUS 200 mcg/hr (03/27/20 1517)  . lactated ringers Stopped (03/27/20 0020)  . metronidazole 500 mg (03/27/20 1453)  . norepinephrine (LEVOPHED) Adult infusion 6 mcg/min (03/27/20 1230)   PRN Meds:.albuterol, fentaNYL (SUBLIMAZE) injection, fentaNYL (SUBLIMAZE) injection, midazolam, nicotine, ondansetron **OR** ondansetron (ZOFRAN) IV   Interim history/subjective:  Sedated on vent breathing at the back up rate s air trapping   Objective   Blood pressure 129/79, pulse 99, temperature 97.8 F (36.6 C), temperature source Axillary, resp. rate 20, height 6' (1.829 m), weight 110.7 kg, SpO2 97 %. CVP:  [4 mmHg-12 mmHg] 9 mmHg  Vent Mode: PRVC FiO2 (%):  [40 %] 40 % Set Rate:  [16 bmp-20 bmp] 20 bmp Vt Set:  [620 mL] 620 mL PEEP:  [5 cmH20] 5 cmH20 Plateau Pressure:  [14 cmH20-15 cmH20] 14 cmH20   Intake/Output Summary (Last 24 hours) at 03/27/2020 1544 Last data filed at 03/27/2020 1500 Gross per  24 hour  Intake 8590.83 ml  Output 150 ml  Net 8440.83 ml   Filed Weights   03/05/2020 0300 03/12/2020 1000 03/27/20 0600  Weight: 102.3 kg 103.2 kg 110.7 kg   CVP:  [4 mmHg-12 mmHg] 9 mmHg   Examination: Tmax 98.6 General: wm > stated age sedated on vent  HENT: oral et/ no og/nb Lungs: minimal exp rhonchi, distant bs Cardiovascular: RRR NSR no s3 /m Abdomen: mod distended/ colostomy ok color Extremities: relatively warm/ pos 2 + ptting Neuro: sedated    I personally reviewed images and agree with radiology impression as follows:  CXR:   Portable  111/1 Endotracheal tube is 4.5 cm above the carina.  Increasing bibasilar atelectasis or infiltrates, left greater than right.  Resolved Hospital Problem list      Assessment & Plan:  1)  GI sepsis secondary to perf sig with feculent peritonitis - no evidence of abd compartment syndrome though remains a risk  >>>  Improved vol expansion to cvp 9 but would push on up to  12 to help get off levophed  And rx with stress HC, not solumedrol  2) Acute vent dep resp failure  - h/o copd but not air trapping at present > no change vent    3) Oliguric AKI secondary to 1)   >> keep tank full, wean off levophed as tol for mean systemic bp > 65    4) thrombocytopenia in pt with sepsis and underlying cirrhosis Lab Results  Component Value Date   PLT 106 (L) 03/27/2020   PLT 119 (L) 03/09/2020   PLT 260 03/01/2020    5)  Coagulopathy ? Early dic vs liver failure Lab Results  Component Value Date   INR 2.7 (H) 03/27/2020   INR 3.2 (H) 03/04/2020   >> rx  2 units FFP    6) post op anemia? Acute blood loss (nothing obvious ongoing)   Lab Results  Component Value Date   HGB 8.7 (L) 03/27/2020   HGB 9.7 (L) 03/13/2020   HGB 12.9 (L) 02/29/2020     7) shock liver in pt with cirrhosis by hx  8) met acidosis s AG  >>> mild at present - ok to change to lactated ringers once uop picks up as risk adding K    Best practice:  Diet:  npo Pain/Anxiety/Delirium protocol (if indicated): post op sedation  VAP protocol (if indicated):  DVT prophylaxis: PAS GI prophylaxis: PPI Glucose control: per [facility]  Mobility: sbr Code Status: full code for now  Family Communication: per gen surgery/ [facility]  Disposition: ICU  Labs   CBC: Recent Labs  Lab 03/09/2020 0725 03/10/2020 0646 03/18/2020 2245 03/27/20 0508  WBC 18.7* 15.8* 6.0 9.9  NEUTROABS 15.8* 13.1* 4.9 4.9  HGB 11.2* 12.9* 9.7* 8.7*  HCT 32.9* 39.2 30.2* 26.6*  MCV 94.3 93.8 98.4 95.7  PLT 224 260 119* 106*    Basic Metabolic Panel: Recent Labs  Lab 03/17/2020 0725 03/17/2020 0646 03/25/2020 2245 03/27/20 0508  NA 136 135 135 133*  K 2.8* 3.2* 4.3 4.4  CL 98 97* 106 103  CO2 27 22 20* 20*  GLUCOSE 137* 264* 91 96  BUN 25* 33* 38* 43*  CREATININE 0.60* 1.12 2.27* 2.41*  CALCIUM 8.5* 9.0 7.4* 8.0*  MG 1.5* 2.0 2.1 1.8  PHOS  --   --  6.7*  --    GFR: Estimated Creatinine Clearance: 48.1 mL/min (A) (by C-G formula based on SCr of 2.41 mg/dL (H)). Recent Labs  Lab 03/04/2020 0725 02/26/2020 0646 03/18/2020 2245 03/27/20 0225 03/27/20 0508  WBC 18.7* 15.8* 6.0  --  9.9  LATICACIDVEN  --   --   --  2.3* 2.0*    Liver Function Tests: Recent Labs  Lab 03/01/2020 0725 03/12/2020 0646 03/13/2020 2245 03/27/20 0508  AST 14* 16 3,983* 4,809*  ALT 25 24 2,470* 2,826*  ALKPHOS 114 103 56 51  BILITOT 0.7 0.9 0.9 1.4*  PROT 6.0* 6.0* 3.8* 4.4*  ALBUMIN 2.9* 2.9* 2.0* 2.8*   No results for input(s): LIPASE, AMYLASE in the last 168 hours. No results for input(s): AMMONIA in the last 168 hours.  ABG    Component Value Date/Time   PHART 7.282 (L) 03/25/2020 2225   PCO2ART 42.1 03/20/2020 2225   PO2ART 78.2 (L) 03/04/2020 2225   HCO3 19.0 (L) 03/08/2020 2225   ACIDBASEDEF 6.3 (H) 03/01/2020 2225   O2SAT 92.0 03/17/2020 2225     Coagulation Profile: Recent Labs  Lab 03/01/2020 2245 03/27/20 1106  INR 3.2* 2.7*  Cardiac Enzymes: No results for input(s):  CKTOTAL, CKMB, CKMBINDEX, TROPONINI in the last 168 hours.  HbA1C: Hgb A1c MFr Bld  Date/Time Value Ref Range Status  03/04/2020 07:25 AM 6.0 (H) 4.8 - 5.6 % Final    Comment:    (NOTE) Pre diabetes:          5.7%-6.4%  Diabetes:              >6.4%  Glycemic control for   <7.0% adults with diabetes     CBG: Recent Labs  Lab 03/10/2020 1614 03/27/20 0003 03/27/20 0445 03/27/20 0738 03/27/20 1112  GLUCAP 170* 75 88 93 99       Past Medical History  He,  has a past medical history of Anemia, CHF (congestive heart failure) (HCC), Chronic back pain, Chronic pain syndrome, Chronic pain syndrome, Chronic sinusitis, COPD (chronic obstructive pulmonary disease) ([place]), Coronary artery disease, Dyslipidemia, Dysphagia, Epididymitis, Erectile dysfunction, Fatigue, GERD (gastroesophageal reflux disease), Groin pain, High cholesterol, Hydrocele, Lumbar radiculopathy, Nicotine addiction, Peripheral vascular disease ([place]), Rectal bleeding, Shortness of breath dyspnea, Stroke ([place]) (2006), and Testicular pain.   Surgical History    Past Surgical History:  Procedure Laterality Date  . APPENDECTOMY       Social History   reports that he has quit smoking. He smoked 0.00 packs per day for 31.00 years. He has never used smokeless tobacco. He reports that he does not drink alcohol and does not use drugs.   Family History   His family history includes Cancer in his father and mother. There is no history of Colon cancer or Colon polyps.   Allergies No Known Allergies   Home Medications  Prior to Admission medications   Medication Sig Start Date End Date Taking? Authorizing Provider  albuterol (PROVENTIL HFA;VENTOLIN HFA) 108 (90 Base) MCG/ACT inhaler Inhale 2 puffs into the lungs every 6 (six) hours as needed for wheezing or shortness of breath.   Yes [provider]  aspirin EC 81 MG tablet Take 81 mg by mouth daily.   Yes [provider]  atorvastatin (LIPITOR) 40 MG  tablet Take 40 mg by mouth daily at 6 PM.  08/16/18  Yes [provider]  buPROPion (WELLBUTRIN XL) 150 MG 24 hr tablet Take 150 mg by mouth daily.   Yes [provider]  fenofibrate (TRICOR) 145 MG tablet Take 145 mg by mouth daily. 01/23/20  Yes [provider]  fluticasone (FLONASE) 50 MCG/ACT nasal spray Place 2 sprays into both nostrils daily. 02/09/20  Yes [provider]  furosemide (LASIX) 20 MG tablet Take 20 mg by mouth daily.  12/24/17  Yes [provider]  HYDROcodone-acetaminophen (NORCO) 10-325 MG tablet Take 1 tablet by mouth every 4 (four) hours as needed for pain. 07/02/19  Yes [provider]  ibuprofen (ADVIL) 800 MG tablet Take 800 mg by mouth 3 (three) times daily. 03/24/20  Yes [provider]  ipratropium-albuterol (DUONEB) 0.5-2.5 (3) MG/3ML SOLN Take 3 mLs by nebulization 3 (three) times daily.   Yes [provider]  LINZESS 290 MCG CAPS capsule Take 290 mcg by mouth daily. 05/04/19  Yes [provider]  methylPREDNISolone (MEDROL DOSEPAK) 4 MG TBPK tablet Take by mouth. 03/24/20  Yes [provider]  omeprazole (PRILOSEC) 40 MG capsule Take 40 mg by mouth daily. 01/11/20  Yes [provider]  sildenafil (REVATIO) 20 MG tablet Take 5 tablets by mouth as needed. 02/22/18  Yes [provider]  [place] 2.5-2.5  MCG/ACT AERS Inhale 2 puffs into the lungs daily. 07/05/18  Yes [provider]  ezetimibe (ZETIA) 10 MG tablet Take 10 mg by mouth at bedtime. 03/17/19   [provider]  Fluticasone-Salmeterol (ADVAIR DISKUS) 250-50 MCG/DOSE AEPB Inhale 1 puff into the lungs in the morning and at bedtime. Patient not taking: Reported on 03/18/2020 10/24/19   Evalee Jefferson, PA-C  venlafaxine XR (EFFEXOR-XR) 75 MG 24 hr capsule Take 75 mg by mouth daily. 03/08/20   [provider]     The patient is critically ill with multiple organ systems failure and  requires high complexity decision making for assessment and support, frequent evaluation and titration of therapies, application of advanced monitoring technologies and extensive interpretation of multiple databases. Critical Care Time devoted to patient care services described in this note is 45 minutes.   Christinia Gully, MD [facility] [place] [phone]   After 7:00 pm call [place]  [phone]

## 2020-03-27 NOTE — Progress Notes (Signed)
  Echocardiogram 2D Echocardiogram has been performed.  Janalyn Harder 03/27/2020, 2:36 PM

## 2020-03-27 NOTE — Progress Notes (Signed)
1 Day Post-Op  Subjective: Patient intubated.  Objective: Vital signs in last 24 hours: Temp:  [96.6 F (35.9 C)-98.6 F (37 C)] 98.6 F (37 C) (11/01 0743) Pulse Rate:  [40-268] 102 (11/01 0600) Resp:  [12-42] 19 (11/01 0600) BP: (62-144)/(25-99) 116/77 (11/01 0600) SpO2:  [93 %-100 %] 96 % (11/01 0600) Arterial Line BP: (81-127)/(48-70) 110/65 (11/01 0600) FiO2 (%):  [40 %-50 %] 40 % (11/01 1059) Weight:  [110.7 kg] 110.7 kg (11/01 0600) Last BM Date: 03/24/20  Intake/Output from previous day: 10/31 0701 - 11/01 0700 In: 7471.5 [I.V.:4146.8; IV Piggyback:3304.7] Out: 100 [Blood:100] Intake/Output this shift: No intake/output data recorded.  General appearance: Intubated Resp: clear to auscultation bilaterally and on ventilator Cardio: Sinus tachycardia GI: Soft, dressing dry and intact.  On matured colostomy not significantly distended.  Pale looking surface.  Lab Results:  Recent Labs    03/06/2020 2245 03/27/20 0508  WBC 6.0 9.9  HGB 9.7* 8.7*  HCT 30.2* 26.6*  PLT 119* 106*   BMET Recent Labs    02/26/2020 2245 03/27/20 0508  NA 135 133*  K 4.3 4.4  CL 106 103  CO2 20* 20*  GLUCOSE 91 96  BUN 38* 43*  CREATININE 2.27* 2.41*  CALCIUM 7.4* 8.0*   PT/INR Recent Labs    03/02/2020 2245  LABPROT 31.4*  INR 3.2*    Studies/Results: CT ABDOMEN PELVIS WO CONTRAST  Result Date: 02/26/2020 CLINICAL DATA:  Abdominal distension. Concern for obstruction. Left lower quadrant tenderness and guarding. EXAM: CT ABDOMEN AND PELVIS WITHOUT CONTRAST TECHNIQUE: Multidetector CT imaging of the abdomen and pelvis was performed following the standard protocol without IV contrast. COMPARISON:  June 06, 2018 FINDINGS: Lower chest: There is atelectasis at versus consolidation at the lung bases, not well evaluated secondary to extensive respiratory motion artifact.The heart size is normal. Hepatobiliary: The liver is normal. The gallbladder is mildly distended. There is mild  gallbladder wall thickening.There is no biliary ductal dilation. Pancreas: Normal contours without ductal dilatation. No peripancreatic fluid collection. Spleen: Unremarkable. Adrenals/Urinary Tract: --Adrenal glands: Unremarkable. --Right kidney/ureter: No hydronephrosis or radiopaque kidney stones. --Left kidney/ureter: There are punctate nonobstructing stones in the left kidney. --Urinary bladder: Unremarkable. Stomach/Bowel: --Stomach/Duodenum: There is a questionable defect in the wall of the greater curvature of the stomach, not well evaluated secondary to respiratory motion artifact (axial series 2, image 26). --Small bowel: Unremarkable. --Colon: There are scattered colonic diverticula without CT evidence for diverticulitis. There is questionable diffuse circumferential wall thickening throughout the transverse colon. Alternatively, this may be secondary to underdistention. --Appendix: Not visualized. No right lower quadrant inflammation or free fluid. Vascular/Lymphatic: Atherosclerotic calcification is present within the non-aneurysmal abdominal aorta, without hemodynamically significant stenosis. --No retroperitoneal lymphadenopathy. --No mesenteric lymphadenopathy. --No pelvic or inguinal lymphadenopathy. Reproductive: Unremarkable Other: There is a small to moderate volume of ascites. There is a small volume free air in the upper abdomen. There are bilateral fat containing inguinal hernias. Musculoskeletal. No acute displaced fractures. IMPRESSION: 1. Examination is significantly limited by respiratory motion artifact and lack of IV contrast. 2. Small volume free air in the upper abdomen, consistent with a perforated viscus in the absence of recent surgery. The exact site of perforation is not clearly identified on this exam, however there is a questionable defect in the wall of the greater curvature of the stomach, not well evaluated secondary to respiratory motion artifact. 3. Questionable diffuse  circumferential wall thickening throughout the transverse colon. Alternatively, this may be secondary to underdistention. Correlation for  signs and symptoms of colitis is recommended. 4. Nonobstructive left nephrolithiasis. 5. Atelectasis at versus consolidation at the lung bases, not well evaluated secondary to extensive respiratory motion artifact. 6. Mild gallbladder wall thickening, likely reactive. If there is high clinical suspicion for a right upper quadrant process, follow-up with ultrasound is recommended. Aortic Atherosclerosis (ICD10-I70.0). These results were called by telephone at the time of interpretation on 03/06/2020 at 3:39 pm to provider [facility] , who verbally acknowledged these results. Electronically Signed   By: Katherine Mantle M.D.   On: 03/21/2020 15:39   DG CHEST PORT 1 VIEW  Result Date: 03/24/2020 CLINICAL DATA:  ET tube repositioning EXAM: PORTABLE CHEST 1 VIEW COMPARISON:  03/05/2020 FINDINGS: Endotracheal tube is 4.5 cm above the carina. Right central line has been placed with the tip in the SVC. No pneumothorax. Heart is normal size. Increasing bibasilar opacities, left greater than right. No effusions. IMPRESSION: Endotracheal tube is 4.5 cm above the carina. Increasing bibasilar atelectasis or infiltrates, left greater than right. Electronically Signed   By: Charlett Nose M.D.   On: 03/21/2020 23:10   DG CHEST PORT 1 VIEW  Result Date: 03/12/2020 CLINICAL DATA:  Respiratory failure EXAM: PORTABLE CHEST 1 VIEW COMPARISON:  Apr 14, 2020 FINDINGS: The cardiomediastinal silhouette is stable. No pneumothorax. The lungs remain clear. No other acute abnormalities. IMPRESSION: No active disease. Electronically Signed   By: Gerome Sam III M.D   On: 03/17/2020 09:17   DG Abd Portable 1V  Result Date: 03/16/2020 CLINICAL DATA:  Shortness of breath and productive cough. EXAM: PORTABLE ABDOMEN - 1 VIEW COMPARISON:  None. FINDINGS: The bowel gas pattern is  normal. No radio-opaque calculi or other significant radiographic abnormality are seen. IMPRESSION: Negative. Electronically Signed   By: Gerome Sam III M.D   On: 03/15/2020 09:17   [place] EKG SITE RITE  Result Date: 03/06/2020 If Site Rite image not attached, placement could not be confirmed due to current cardiac rhythm.   Anti-infectives: Anti-infectives (From admission, onward)   Start     Dose/Rate Route Frequency Ordered Stop   03/27/20 1800  ceFEPIme (MAXIPIME) 2 g in sodium chloride 0.9 % 100 mL IVPB        2 g 200 mL/hr over 30 Minutes Intravenous Every 12 hours 03/27/20 0756     03/27/20 0500  ceFEPIme (MAXIPIME) 2 g in sodium chloride 0.9 % 100 mL IVPB  Status:  Discontinued        2 g 200 mL/hr over 30 Minutes Intravenous Every 8 hours 03/17/2020 2124 03/27/20 0756   03/05/2020 2200  ceFEPIme (MAXIPIME) 2 g in sodium chloride 0.9 % 100 mL IVPB  Status:  Discontinued        2 g 200 mL/hr over 30 Minutes Intravenous  Once 03/19/2020 2100 03/20/2020 2124   03/16/2020 2200  metroNIDAZOLE (FLAGYL) IVPB 500 mg        500 mg 100 mL/hr over 60 Minutes Intravenous Every 8 hours 03/19/2020 2100     03/11/2020 2130  ceFEPIme (MAXIPIME) 2 g in sodium chloride 0.9 % 100 mL IVPB        2 g 200 mL/hr over 30 Minutes Intravenous  Once 02/27/2020 2124 03/27/20 0048   03/20/2020 1624  cefoTEtan (CEFOTAN) 2 g in sodium chloride 0.9 % 100 mL IVPB        2 g 200 mL/hr over 30 Minutes Intravenous 30 min pre-op 03/09/2020 1615 03/24/2020 1750   03/20/2020 1100  doxycycline (VIBRAMYCIN) 100 mg  in sodium chloride 0.9 % 250 mL IVPB  Status:  Discontinued        100 mg 125 mL/hr over 120 Minutes Intravenous Every 12 hours 03/06/2020 0937 03/19/2020 1630   16-Apr-2020 1515  doxycycline (VIBRA-TABS) tablet 100 mg  Status:  Discontinued        100 mg Oral Every 12 hours 04-16-2020 1509 03/12/2020 0937      Assessment/Plan: s/p Procedure(s): EXPLORATORY LAPAROTOMY, PARTICAL COLECTOMY COLOSTOMY Impression: 1.  Patient showing  evidence of multiple organ failure.  He has had no urine output and continues on Levophed support.  Liver tests worsening.  Concerned about hepatorenal syndrome.  INR worsening and thrombocytopenia developing.  This could be as early sign of DIC.  Continue supplementing albumin, intravascular volume.  Would continue ventilatory support.  Prognosis is poor.  Discussed with Dr. Laural Benes.  LOS: 2 days    Manuel Richard 03/27/2020

## 2020-03-27 DEATH — deceased

## 2020-03-28 ENCOUNTER — Encounter (HOSPITAL_COMMUNITY): Payer: Self-pay | Admitting: Surgery

## 2020-03-28 ENCOUNTER — Inpatient Hospital Stay (HOSPITAL_COMMUNITY): Payer: Medicare Other

## 2020-03-28 ENCOUNTER — Telehealth: Payer: Self-pay | Admitting: Internal Medicine

## 2020-03-28 DIAGNOSIS — J441 Chronic obstructive pulmonary disease with (acute) exacerbation: Secondary | ICD-10-CM | POA: Diagnosis not present

## 2020-03-28 DIAGNOSIS — A419 Sepsis, unspecified organism: Secondary | ICD-10-CM | POA: Diagnosis not present

## 2020-03-28 DIAGNOSIS — R6521 Severe sepsis with septic shock: Secondary | ICD-10-CM | POA: Diagnosis not present

## 2020-03-28 DIAGNOSIS — J9621 Acute and chronic respiratory failure with hypoxia: Secondary | ICD-10-CM | POA: Diagnosis not present

## 2020-03-28 LAB — BPAM FFP
Blood Product Expiration Date: 202111062359
Blood Product Expiration Date: 202111062359
ISSUE DATE / TIME: 202111011705
ISSUE DATE / TIME: 202111012035
Unit Type and Rh: 5100
Unit Type and Rh: 9500

## 2020-03-28 LAB — CBC
HCT: 19.2 % — ABNORMAL LOW (ref 39.0–52.0)
Hemoglobin: 6.2 g/dL — CL (ref 13.0–17.0)
MCH: 31.6 pg (ref 26.0–34.0)
MCHC: 32.3 g/dL (ref 30.0–36.0)
MCV: 98 fL (ref 80.0–100.0)
Platelets: 58 10*3/uL — ABNORMAL LOW (ref 150–400)
RBC: 1.96 MIL/uL — ABNORMAL LOW (ref 4.22–5.81)
RDW: 14.5 % (ref 11.5–15.5)
WBC: 5.8 10*3/uL (ref 4.0–10.5)
nRBC: 0 % (ref 0.0–0.2)

## 2020-03-28 LAB — SURGICAL PATHOLOGY

## 2020-03-28 LAB — CBC WITH DIFFERENTIAL/PLATELET
Band Neutrophils: 9 %
Basophils Absolute: 0 10*3/uL (ref 0.0–0.1)
Basophils Relative: 0 %
Eosinophils Absolute: 0 10*3/uL (ref 0.0–0.5)
Eosinophils Relative: 0 %
HCT: 20.5 % — ABNORMAL LOW (ref 39.0–52.0)
Hemoglobin: 6.5 g/dL — CL (ref 13.0–17.0)
Lymphocytes Relative: 6 %
Lymphs Abs: 0.4 10*3/uL — ABNORMAL LOW (ref 0.7–4.0)
MCH: 31.4 pg (ref 26.0–34.0)
MCHC: 31.7 g/dL (ref 30.0–36.0)
MCV: 99 fL (ref 80.0–100.0)
Metamyelocytes Relative: 1 %
Monocytes Absolute: 0.2 10*3/uL (ref 0.1–1.0)
Monocytes Relative: 3 %
Neutro Abs: 5.8 10*3/uL (ref 1.7–7.7)
Neutrophils Relative %: 81 %
Platelets: 58 10*3/uL — ABNORMAL LOW (ref 150–400)
RBC: 2.07 MIL/uL — ABNORMAL LOW (ref 4.22–5.81)
RDW: 14.4 % (ref 11.5–15.5)
WBC: 6.4 10*3/uL (ref 4.0–10.5)
nRBC: 0.3 % — ABNORMAL HIGH (ref 0.0–0.2)

## 2020-03-28 LAB — GLUCOSE, CAPILLARY
Glucose-Capillary: 106 mg/dL — ABNORMAL HIGH (ref 70–99)
Glucose-Capillary: 106 mg/dL — ABNORMAL HIGH (ref 70–99)
Glucose-Capillary: 107 mg/dL — ABNORMAL HIGH (ref 70–99)
Glucose-Capillary: 109 mg/dL — ABNORMAL HIGH (ref 70–99)
Glucose-Capillary: 121 mg/dL — ABNORMAL HIGH (ref 70–99)
Glucose-Capillary: 121 mg/dL — ABNORMAL HIGH (ref 70–99)

## 2020-03-28 LAB — COMPREHENSIVE METABOLIC PANEL
ALT: 1303 U/L — ABNORMAL HIGH (ref 0–44)
AST: 892 U/L — ABNORMAL HIGH (ref 15–41)
Albumin: 3.1 g/dL — ABNORMAL LOW (ref 3.5–5.0)
Alkaline Phosphatase: 58 U/L (ref 38–126)
Anion gap: 11 (ref 5–15)
BUN: 61 mg/dL — ABNORMAL HIGH (ref 6–20)
CO2: 19 mmol/L — ABNORMAL LOW (ref 22–32)
Calcium: 8.2 mg/dL — ABNORMAL LOW (ref 8.9–10.3)
Chloride: 107 mmol/L (ref 98–111)
Creatinine, Ser: 3.21 mg/dL — ABNORMAL HIGH (ref 0.61–1.24)
GFR, Estimated: 23 mL/min — ABNORMAL LOW (ref >60)
Glucose, Bld: 115 mg/dL — ABNORMAL HIGH (ref 70–99)
Potassium: 4.8 mmol/L (ref 3.5–5.1)
Sodium: 137 mmol/L (ref 135–145)
Total Bilirubin: 1.5 mg/dL — ABNORMAL HIGH (ref 0.3–1.2)
Total Protein: 4.8 g/dL — ABNORMAL LOW (ref 6.5–8.1)

## 2020-03-28 LAB — PREPARE FRESH FROZEN PLASMA: Unit division: 0

## 2020-03-28 LAB — ABO/RH: ABO/RH(D): O POS

## 2020-03-28 LAB — HEMOGLOBIN AND HEMATOCRIT, BLOOD
HCT: 25.2 % — ABNORMAL LOW (ref 39.0–52.0)
Hemoglobin: 8.2 g/dL — ABNORMAL LOW (ref 13.0–17.0)

## 2020-03-28 LAB — PREPARE RBC (CROSSMATCH)

## 2020-03-28 LAB — PROTIME-INR
INR: 2 — ABNORMAL HIGH (ref 0.8–1.2)
Prothrombin Time: 22.4 seconds — ABNORMAL HIGH (ref 11.4–15.2)

## 2020-03-28 LAB — PHOSPHORUS: Phosphorus: 6.7 mg/dL — ABNORMAL HIGH (ref 2.5–4.6)

## 2020-03-28 LAB — MAGNESIUM: Magnesium: 1.9 mg/dL (ref 1.7–2.4)

## 2020-03-28 LAB — TRIGLYCERIDES: Triglycerides: 98 mg/dL (ref <150)

## 2020-03-28 MED ORDER — SODIUM CHLORIDE 0.9 % IV SOLN
100.0000 mg | INTRAVENOUS | Status: DC
  Administered 2020-03-29 – 2020-03-30 (×2): 100 mg via INTRAVENOUS
  Filled 2020-03-28 (×6): qty 100

## 2020-03-28 MED ORDER — STERILE WATER FOR INJECTION IV SOLN
INTRAVENOUS | Status: DC
  Filled 2020-03-28 (×4): qty 850

## 2020-03-28 MED ORDER — SODIUM CHLORIDE 0.9 % IV SOLN
200.0000 mg | Freq: Once | INTRAVENOUS | Status: AC
  Administered 2020-03-28: 200 mg via INTRAVENOUS
  Filled 2020-03-28: qty 200

## 2020-03-28 MED ORDER — HYDROCORTISONE NA SUCCINATE PF 100 MG IJ SOLR
50.0000 mg | Freq: Two times a day (BID) | INTRAMUSCULAR | Status: DC
  Administered 2020-03-28 – 2020-03-29 (×2): 50 mg via INTRAVENOUS
  Filled 2020-03-28 (×2): qty 2

## 2020-03-28 MED ORDER — SODIUM CHLORIDE 0.9% IV SOLUTION: Freq: Once | INTRAVENOUS | Status: DC

## 2020-03-28 NOTE — Progress Notes (Signed)
2 Days Post-Op  Subjective: Intubated on fentanyl sedation  Objective: Vital signs in last 24 hours: Temp:  [97.3 F (36.3 C)-98.7 F (37.1 C)] 98 F (36.7 C) (11/02 0855) Pulse Rate:  [80-173] 91 (11/02 0855) Resp:  [17-21] 20 (11/02 0855) BP: (103-138)/(63-86) 119/80 (11/02 0855) SpO2:  [93 %-100 %] 94 % (11/02 0842) Arterial Line BP: (107-137)/(64-81) 125/69 (11/02 0750) FiO2 (%):  [40 %] 40 % (11/02 0844) Last BM Date: 03/24/20 (Newly created ostomy, nonfunctioning at this time)  Intake/Output from previous day: 11/01 0701 - 11/02 0700 In: 3527.5 [I.V.:968; Blood:1039; IV Piggyback:1520.5] Out: 850 [Urine:850] Intake/Output this shift: Total I/O In: 504 [Blood:304; Other:200] Out: -   General appearance: Sedated Resp: clear to auscultation bilaterally Cardio: regular rate and rhythm, S1, S2 normal, no murmur, click, rub or gallop GI: Soft, incision healing well.  Unmatured ostomy pale with out significant distention.  Lab Results:  Recent Labs    03/28/20 0113 03/28/20 0428  WBC 5.8 6.4  HGB 6.2* 6.5*  HCT 19.2* 20.5*  PLT 58* 58*   BMET Recent Labs    03/27/20 0508 03/28/20 0428  NA 133* 137  K 4.4 4.8  CL 103 107  CO2 20* 19*  GLUCOSE 96 115*  BUN 43* 61*  CREATININE 2.41* 3.21*  CALCIUM 8.0* 8.2*   PT/INR Recent Labs    03/27/20 1106 03/28/20 0730  LABPROT 27.8* 22.4*  INR 2.7* 2.0*    Studies/Results: CT ABDOMEN PELVIS WO CONTRAST  Result Date: 03/11/2020 CLINICAL DATA:  Abdominal distension. Concern for obstruction. Left lower quadrant tenderness and guarding. EXAM: CT ABDOMEN AND PELVIS WITHOUT CONTRAST TECHNIQUE: Multidetector CT imaging of the abdomen and pelvis was performed following the standard protocol without IV contrast. COMPARISON:  June 06, 2018 FINDINGS: Lower chest: There is atelectasis at versus consolidation at the lung bases, not well evaluated secondary to extensive respiratory motion artifact.The heart size is normal.  Hepatobiliary: The liver is normal. The gallbladder is mildly distended. There is mild gallbladder wall thickening.There is no biliary ductal dilation. Pancreas: Normal contours without ductal dilatation. No peripancreatic fluid collection. Spleen: Unremarkable. Adrenals/Urinary Tract: --Adrenal glands: Unremarkable. --Right kidney/ureter: No hydronephrosis or radiopaque kidney stones. --Left kidney/ureter: There are punctate nonobstructing stones in the left kidney. --Urinary bladder: Unremarkable. Stomach/Bowel: --Stomach/Duodenum: There is a questionable defect in the wall of the greater curvature of the stomach, not well evaluated secondary to respiratory motion artifact (axial series 2, image 26). --Small bowel: Unremarkable. --Colon: There are scattered colonic diverticula without CT evidence for diverticulitis. There is questionable diffuse circumferential wall thickening throughout the transverse colon. Alternatively, this may be secondary to underdistention. --Appendix: Not visualized. No right lower quadrant inflammation or free fluid. Vascular/Lymphatic: Atherosclerotic calcification is present within the non-aneurysmal abdominal aorta, without hemodynamically significant stenosis. --No retroperitoneal lymphadenopathy. --No mesenteric lymphadenopathy. --No pelvic or inguinal lymphadenopathy. Reproductive: Unremarkable Other: There is a small to moderate volume of ascites. There is a small volume free air in the upper abdomen. There are bilateral fat containing inguinal hernias. Musculoskeletal. No acute displaced fractures. IMPRESSION: 1. Examination is significantly limited by respiratory motion artifact and lack of IV contrast. 2. Small volume free air in the upper abdomen, consistent with a perforated viscus in the absence of recent surgery. The exact site of perforation is not clearly identified on this exam, however there is a questionable defect in the wall of the greater curvature of the stomach,  not well evaluated secondary to respiratory motion artifact. 3. Questionable diffuse circumferential wall  thickening throughout the transverse colon. Alternatively, this may be secondary to underdistention. Correlation for signs and symptoms of colitis is recommended. 4. Nonobstructive left nephrolithiasis. 5. Atelectasis at versus consolidation at the lung bases, not well evaluated secondary to extensive respiratory motion artifact. 6. Mild gallbladder wall thickening, likely reactive. If there is high clinical suspicion for a right upper quadrant process, follow-up with ultrasound is recommended. Aortic Atherosclerosis (ICD10-I70.0). These results were called by telephone at the time of interpretation on 03/14/2020 at 3:39 pm to [facility] , who verbally acknowledged these results. Electronically Signed   By: Katherine Mantlehristopher  Green M.D.   On: 02/27/2020 15:39   DG Chest Port 1 View  Result Date: 03/28/2020 CLINICAL DATA:  Acute respiratory failure. Shortness of breath. Productive cough. EXAM: PORTABLE CHEST 1 VIEW COMPARISON:  03/04/2020. FINDINGS: Endotracheal tube and right IJ line in stable position. Cardiomegaly. Low lung volumes with progressive bibasilar atelectasis and infiltrates/edema. Small left pleural effusion. Biapical pleural thickening again noted consistent scarring. No pneumothorax. IMPRESSION: 1. Lines and tubes stable position. 2. Cardiomegaly. 3. Low lung volumes with progressive bibasilar atelectasis and infiltrates/edema. Electronically Signed   By: Maisie Fushomas  Register   On: 03/28/2020 05:40   DG CHEST PORT 1 VIEW  Result Date: 03/06/2020 CLINICAL DATA:  ET tube repositioning EXAM: PORTABLE CHEST 1 VIEW COMPARISON:  03/14/2020 FINDINGS: Endotracheal tube is 4.5 cm above the carina. Right central line has been placed with the tip in the SVC. No pneumothorax. Heart is normal size. Increasing bibasilar opacities, left greater than right. No effusions. IMPRESSION: Endotracheal  tube is 4.5 cm above the carina. Increasing bibasilar atelectasis or infiltrates, left greater than right. Electronically Signed   By: Charlett NoseKevin  Dover M.D.   On: 03/24/2020 23:10   ECHOCARDIOGRAM COMPLETE  Result Date: 03/27/2020    ECHOCARDIOGRAM REPORT   Patient Name:   Manuel Richard Date of Exam: 03/27/2020 Medical Rec #:  [id number]      Height:       72.0 in Accession #:    [id number]     Weight:       244.0 lb Date of Birth:  06/28/1970      BSA:          2.319 m Patient Age:    49 years       BP:           134/83 mmHg Patient Gender: M              HR:           107 bpm. [facility] Procedure: 2D Echo, Cardiac Doppler and Color Doppler Indications:    I50.9* Heart failure (unspecified)  History:        Patient has no prior history of Echocardiogram examinations.                 Abnormal ECG, COPD; Risk Factors:Dyslipidemia. Septic shock.  Sonographer:    Sheralyn Boatmanina West RDCS (AE) Referring Phys: [id number] Cleora FleetLANFORD L JOHNSON  Sonographer Comments: Technically difficult study due to poor echo windows, patient is morbidly obese and echo performed with patient supine and on artificial respirator. Image acquisition challenging due to patient body habitus. IMPRESSIONS  1. Left ventricular ejection fraction, by estimation, is 65 to 70%. The left ventricle has normal function. The left ventricle has no regional wall motion abnormalities. There is mild left ventricular hypertrophy. Left ventricular diastolic parameters were normal.  2. Right ventricular systolic function is normal. The right ventricular  size is normal.  3. The mitral valve is normal in structure. No evidence of mitral valve regurgitation. No evidence of mitral stenosis.  4. The aortic valve is tricuspid. Aortic valve regurgitation is not visualized. No aortic stenosis is present.  5. Aortic dilatation noted. There is mild dilatation of the aortic root, measuring 41 mm. FINDINGS  Left Ventricle: Left ventricular ejection fraction, by estimation, is  65 to 70%. The left ventricle has normal function. The left ventricle has no regional wall motion abnormalities. The left ventricular internal cavity size was normal in size. There is  mild left ventricular hypertrophy. Left ventricular diastolic parameters were normal. Right Ventricle: The right ventricular size is normal. No increase in right ventricular wall thickness. Right ventricular systolic function is normal. Left Atrium: Left atrial size was normal in size. Right Atrium: Right atrial size was normal in size. Pericardium: There is no evidence of pericardial effusion. Mitral Valve: The mitral valve is normal in structure. No evidence of mitral valve regurgitation. No evidence of mitral valve stenosis. Tricuspid Valve: The tricuspid valve is normal in structure. Tricuspid valve regurgitation is not demonstrated. No evidence of tricuspid stenosis. Aortic Valve: The aortic valve is tricuspid. Aortic valve regurgitation is not visualized. No aortic stenosis is present. Aortic valve mean gradient measures 4.9 mmHg. Aortic valve peak gradient measures 8.5 mmHg. Aortic valve area, by VTI measures 4.22 cm. Pulmonic Valve: The pulmonic valve was not well visualized. Pulmonic valve regurgitation is not visualized. No evidence of pulmonic stenosis. Aorta: The aortic root is normal in size and structure and aortic dilatation noted. There is mild dilatation of the aortic root, measuring 41 mm. Venous: IVC assessment for right atrial pressure unable to be performed due to mechanical ventilation. IAS/Shunts: No atrial level shunt detected by color flow Doppler.  LEFT VENTRICLE PLAX 2D LVIDd:         4.50 cm      Diastology LVIDs:         3.15 cm      LV e' medial:   9.79 cm/s LV PW:         1.20 cm      LV E/e' medial: 9.2 LV IVS:        1.24 cm LVOT diam:     2.60 cm LV SV:         106 LV SV Index:   46 LVOT Area:     5.31 cm  LV Volumes (MOD) LV vol d, MOD A2C: 144.0 ml LV vol d, MOD A4C: 123.0 ml LV vol s, MOD A2C:  63.9 ml LV vol s, MOD A4C: 54.5 ml LV SV MOD A2C:     80.1 ml LV SV MOD A4C:     123.0 ml LV SV MOD BP:      74.4 ml RIGHT VENTRICLE RV S prime:     18.80 cm/s TAPSE (M-mode): 2.2 cm LEFT ATRIUM           Index       RIGHT ATRIUM           Index LA diam:      4.00 cm 1.73 cm/m  RA Area:     20.30 cm LA Vol (A2C): 34.3 ml 14.79 ml/m RA Volume:   63.80 ml  27.52 ml/m LA Vol (A4C): 61.0 ml 26.31 ml/m  AORTIC VALVE AV Area (Vmax):    4.67 cm AV Area (Vmean):   4.50 cm AV Area (VTI):     4.22 cm AV  Vmax:           145.51 cm/s AV Vmean:          103.499 cm/s AV VTI:            0.251 m AV Peak Grad:      8.5 mmHg AV Mean Grad:      4.9 mmHg LVOT Vmax:         128.00 cm/s LVOT Vmean:        87.800 cm/s LVOT VTI:          0.200 m LVOT/AV VTI ratio: 0.80  AORTA Ao Root diam: 3.80 cm Ao Asc diam:  3.80 cm MITRAL VALVE MV Area (PHT): 8.72 cm     SHUNTS MV Decel Time: 87 msec      Systemic VTI:  0.20 m MV E velocity: 90.30 cm/s   Systemic Diam: 2.60 cm MV A velocity: 104.00 cm/s MV E/A ratio:  0.87 Dina Rich MD Electronically signed by Dina Rich MD Signature Date/Time: 03/27/2020/3:39:01 PM    Final    [place] EKG SITE RITE  Result Date: 04/11/2020 If Site Rite image not attached, placement could not be confirmed due to current cardiac rhythm.   Anti-infectives: Anti-infectives (From admission, onward)   Start     Dose/Rate Route Frequency Ordered Stop   03/27/20 1800  ceFEPIme (MAXIPIME) 2 g in sodium chloride 0.9 % 100 mL IVPB        2 g 200 mL/hr over 30 Minutes Intravenous Every 12 hours 03/27/20 0756     03/27/20 0500  ceFEPIme (MAXIPIME) 2 g in sodium chloride 0.9 % 100 mL IVPB  Status:  Discontinued        2 g 200 mL/hr over 30 Minutes Intravenous Every 8 hours 11-Apr-2020 2124 03/27/20 0756   11-Apr-2020 2200  ceFEPIme (MAXIPIME) 2 g in sodium chloride 0.9 % 100 mL IVPB  Status:  Discontinued        2 g 200 mL/hr over 30 Minutes Intravenous  Once 04/11/20 2100 04/11/2020 2124   11-Apr-2020 2200   metroNIDAZOLE (FLAGYL) IVPB 500 mg        500 mg 100 mL/hr over 60 Minutes Intravenous Every 8 hours 2020/04/11 2100     04/11/2020 2130  ceFEPIme (MAXIPIME) 2 g in sodium chloride 0.9 % 100 mL IVPB        2 g 200 mL/hr over 30 Minutes Intravenous  Once 04/11/20 2124 03/27/20 0048   2020-04-11 1624  cefoTEtan (CEFOTAN) 2 g in sodium chloride 0.9 % 100 mL IVPB        2 g 200 mL/hr over 30 Minutes Intravenous 30 min pre-op 04/11/20 1615 2020-04-11 1750   04-11-20 1100  doxycycline (VIBRAMYCIN) 100 mg in sodium chloride 0.9 % 250 mL IVPB  Status:  Discontinued        100 mg 125 mL/hr over 120 Minutes Intravenous Every 12 hours Apr 11, 2020 0937 04/11/2020 1630   03/14/2020 1515  doxycycline (VIBRA-TABS) tablet 100 mg  Status:  Discontinued        100 mg Oral Every 12 hours 02/27/2020 1509 2020-04-11 0937      Assessment/Plan: s/p Procedure(s): EXPLORATORY LAPAROTOMY, PARTICAL COLECTOMY COLOSTOMY Impression: Postoperative day 2.  Patient had significant hemoglobin drop which is being treated with 2 units of packed red blood cells.  Also has worsening thrombocytopenia.  Concerned that the patient may be developing DIC.  INR is 2.1.  Will continue to monitor.  Patient has had some urine output, but his  renal function tests are worsening.  Nephrology has been consulted.  Patient is off Levophed which is encouraging.  Liver enzyme tests have improved since yesterday. Will open unmatured end colostomy tomorrow.  LOS: 3 days    Franky Macho 03/28/2020

## 2020-03-28 NOTE — Progress Notes (Signed)
NAME:  Manuel Richard, MRN:  [id number], DOB:  [birth year], LOS: 3 ADMISSION DATE:  03/14/2020, CONSULTATION DATE: 03/27/20 (emergency basis as discharged from pulmonary clinic for beligerent behavior)  REFERRING MD:  Wynetta Emery, [facility] , CHIEF COMPLAINT:  GI sepsis, resp failure   Brief History   72 yowm former smoker with extensive pmhx admit 10/30 with apparent aecopd then developed abd pain 10/31 > dx Perf viscus   >>> returned on vent  ex-lap for perforated sigmoid c/b feculent peritonitis and is now s/p sigmoid resection and ostomy creation with circulatory shock, met acidosis and decreased uop on vent for resp failure and PCCM service asked to consult 11/1   History of present illness   From epic notes:  49 y.o. male with medical history significant very severe COPD, longtime smoker with nicotine dependence, chronic pain, diastolic CHF, GERD, hyperlipidemia, supplemental oxygen dependent (2-4L/min), chronic dyspnea who had been followed by pulmonary clinic until he was dismissed from the practice due to belligerent behaviors.  He has been having a difficult time with chronic shortness of breath with acute exacerbations over the past month with multiple emergency department visits.  He was most recently seen in the emergency department on 10/16 where he was treated for an acute exacerbation of COPD and sent home.  He returned to the emergency department today with symptoms of progressive shortness of breath difficulty speaking and poor exercise tolerance.  He has been having cough and shortness of breath with yellowish sputum production.  He also reports chronic leg edema which is mostly unchanged.  He says that his shortness of breath woke him up in the middle of the night.  He is not lying flat recumbent.  His main complaint is the shortness of breath.  He is breathing very fast.  He is adamant that he stopped smoking 1 month ago.  He has a 31+ year history of smoking up to 2 packs a day.  ED  Course: The patient was tachycardic on arrival with a heart rate in the 120 range.  Patient was tachypneic with a respiratory rate of 21.  Temperature 98.6.  Pulse ox 99% on 3 L nasal cannula.  Blood pressure 111/90.  Patient was noted to have hypokalemia with a potassium of 2.8.  BUN 25 creatinine 0.60 calcium 8.5 albumin 2.9 magnesium 1.5 total protein 6.0 AST 14 ALT 25.  Cardiac BNP 73.0.  WBC 18.7.  Hemoglobin 11.2.  Platelet count 224.  Influenza a and B testing negative SARS 2 coronavirus test negative.  Chest x-ray no acute findings.  CT angio chest nondiagnostic beyond main pulmonary arteries due to poor quality study.  EKG with findings of sinus tachycardia.  Patient was given continuous nebulizer treatments.  Patient was given IV magnesium and potassium.  The patient was monitored in the emergency department for several hours however continued to have tachypnea and tachycardia.  He continues to have shortness of breath symptoms.  Admission was requested for further evaluation and management COPD exacerbation acute on chronic respiratory failure with hypoxia.   Past Medical History    Anemia, CHF (congestive heart failure) (HCC), Chronic back pain, Chronic pain syndrome, Chronic pain syndrome, Chronic sinusitis, COPD (chronic obstructive pulmonary disease) (no pfts in [place])  Coronary artery disease, Dyslipidemia, Dysphagia, Epididymitis, Erectile dysfunction, Fatigue, GERD (gastroesophageal reflux disease), Groin pain, High cholesterol, Hydrocele, Lumbar radiculopathy, Nicotine addiction, Peripheral vascular disease ([place]), Rectal bleeding, Shortness of breath dyspnea, Stroke ([place]) (2006), and Testicular pain.   Significant Hospital Events  OR pm 10/31: feculent peritonitis/ sigmoid perf  Off pressors by am 11/2   Consults:  Gen surgery  10/31 >>> PCCM   11/1 >>> Renal 11/2  >>>  Procedures:  R IJ  10/31     >>> Oral et 10/31 >>> L RA  art line 10/31 >>>   Significant Diagnostic Tests:     Micro Data:  COVID 19  10/31 PCR neg MRSA  10/31   PCR  Neg  Abd wound 10/31 >>>  Antimicrobials:  Doxy 10/30 only Cefepime 10/31 >>> Flagyl  10/31 >>>   Scheduled Meds: . sodium chloride   Intravenous Once  . buPROPion  150 mg Oral Daily  . chlorhexidine gluconate (MEDLINE KIT)  15 mL Mouth Rinse BID  . Chlorhexidine Gluconate Cloth  6 each Topical Daily  . docusate  100 mg Per Tube BID  . fluticasone  2 spray Each Nare Daily  . furosemide  30 mg Intravenous Daily  . hydrocortisone sodium succinate  100 mg Intravenous Q12H  . insulin aspart  0-9 Units Subcutaneous Q4H  . levalbuterol  0.63 mg Nebulization Q6H  . mouth rinse  15 mL Mouth Rinse 10 times per day  . pantoprazole (PROTONIX) IV  40 mg Intravenous Q24H  . polyethylene glycol  17 g Per Tube Daily  . venlafaxine XR  75 mg Oral Daily   Continuous Infusions: . anidulafungin 200 mg (03/28/20 1358)   Followed by  . [START ON 03/29/2020] anidulafungin    . ceFEPime (MAXIPIME) IV 200 mL/hr at 03/28/20 0531  . fentaNYL infusion INTRAVENOUS 225 mcg/hr (03/28/20 0531)  . metronidazole 500 mg (03/28/20 0531)  . norepinephrine (LEVOPHED) Adult infusion Stopped (03/27/20 2257)  .  sodium bicarbonate (isotonic) infusion in sterile water     PRN Meds:.albuterol, fentaNYL (SUBLIMAZE) injection, fentaNYL (SUBLIMAZE) injection, midazolam, nicotine, ondansetron **OR** ondansetron (ZOFRAN) IV   Interim history/subjective:  Comfortable on present setting breathing near back up rate s air trapping on graphics and no longer on pressors   Objective   Blood pressure 119/78, pulse 84, temperature 98.9 F (37.2 C), temperature source Axillary, resp. rate 20, height 6' (1.829 m), weight 110.7 kg, SpO2 96 %. CVP:  [7 mmHg-15 mmHg] 10 mmHg  Vent Mode: PRVC FiO2 (%):  [40 %] 40 % Set Rate:  [20 bmp] 20 bmp Vt Set:  [620 mL] 620 mL PEEP:  [5 cmH20] 5 cmH20 Plateau Pressure:  [15 cmH20-19 cmH20] 17 cmH20   Intake/Output Summary  (Last 24 hours) at 03/28/2020 1359 Last data filed at 03/28/2020 1119 Gross per 24 hour  Intake 3093.55 ml  Output 850 ml  Net 2243.55 ml   Filed Weights   03/04/2020 0300 03/10/2020 1000 03/27/20 0600  Weight: 102.3 kg 103.2 kg 110.7 kg   CVP:  [7 mmHg-15 mmHg] 10 mmHg   Examination: Tmax  98.9  General: comfortabel on vent  HENT: oral et/OG   Lungs: distant bs  Cardiovascular: RRR no S3/m Abdomen: mod distended, relatively soft, min OG output Extremities:warm  2+ pitting/ pas  Neuro: sedated on fent/ diprovan    I personally reviewed images and agree with radiology impression as follows:  CXR:   Portable  11/2 1. Lines and tubes stable position. 2. Cardiomegaly. 3. Low lung volumes with progressive bibasilar atelectasis and infiltrates/edema.      Resolved Hospital Problem list      Assessment & Plan:  1)  GI sepsis secondary to perf sig with feculent peritonitis - no evidence of abd  compartment syndrome  >> off pressors, cvp adequate   2) Acute vent dep resp failure  - h/o copd  No air trapping but decreased aeration bases predictable given low Cabd so no weaning feasible for now    3) Oliguric AKI secondary to 1)     Lab Results  Component Value Date   CREATININE 3.21 (H) 03/28/2020   CREATININE 2.41 (H) 03/27/2020   CREATININE 2.27 (H) 02/26/2020     Hopefully has peaked as uop up / renal eval no change x added hc03 to IVF  4) thrombocytopenia in pt with sepsis and underlying cirrhosis Lab Results  Component Value Date   PLT 58 (L) 03/28/2020   PLT 58 (L) 03/28/2020   PLT 106 (L) 03/27/2020   - no evidence dc clinically so 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 Early dic vs liver failure Lab Results  Component Value Date   INR 2.0 (H) 03/28/2020   INR 2.7 (H) 03/27/2020   INR 3.2 (H) 03/02/2020   >> rx  2 units FFP 11/1, no active bleeding so hold off   6) post op anemia? Acute blood loss (nothing obvious ongoing)    Lab Results  Component Value Date   HGB 6.5 (LL) 03/28/2020   HGB 6.2 (LL) 03/28/2020   HGB 8.7 (L) 03/27/2020  threshold to trx = less than 7 or less than 8 with recurrent pressor dep esp if cvp on low side    7) shock liver in pt with cirrhosis by hx - lfts improved 11/2  8) met acidosis s AG  >>>only mild at present > addressed by renal already  Best practice:  Diet: npo Pain/Anxiety/Delirium protocol (if indicated): post op sedation  VAP protocol (if indicated):  DVT prophylaxis: PAS GI prophylaxis: PPI Glucose control: per [facility]  Mobility: SBR Code Status: full code for now  Family Communication: per gen surgery/ [facility]  Disposition: ICU  Labs   CBC: Recent Labs  Lab 03/01/2020 0725 03/22/2020 0725 03/08/2020 0646 03/12/2020 2245 03/27/20 0508 03/28/20 0113 03/28/20 0428  WBC 18.7*   < > 15.8* 6.0 9.9 5.8 6.4  NEUTROABS 15.8*  --  13.1* 4.9 4.9  --  5.8  HGB 11.2*   < > 12.9* 9.7* 8.7* 6.2* 6.5*  HCT 32.9*   < > 39.2 30.2* 26.6* 19.2* 20.5*  MCV 94.3   < > 93.8 98.4 95.7 98.0 99.0  PLT 224   < > 260 119* 106* 58* 58*   < > = values in this interval not displayed.    Basic Metabolic Panel: Recent Labs  Lab 03/24/2020 0725 02/27/2020 0646 03/22/2020 2245 03/27/20 0508 03/28/20 0428  NA 136 135 135 133* 137  K 2.8* 3.2* 4.3 4.4 4.8  CL 98 97* 106 103 107  CO2 27 22 20* 20* 19*  GLUCOSE 137* 264* 91 96 115*  BUN 25* 33* 38* 43* 61*  CREATININE 0.60* 1.12 2.27* 2.41* 3.21*  CALCIUM 8.5* 9.0 7.4* 8.0* 8.2*  MG 1.5* 2.0 2.1 1.8 1.9  PHOS  --   --  6.7*  --  6.7*   GFR: Estimated Creatinine Clearance: 36.1 mL/min (A) (by C-G formula based on SCr of 3.21 mg/dL (H)). Recent Labs  Lab 02/28/2020 2245 03/27/20 0225 03/27/20 0508 03/28/20 0113 03/28/20 0428  WBC 6.0  --  9.9 5.8 6.4  LATICACIDVEN  --  2.3* 2.0*  --   --     Liver Function Tests: Recent Labs  Lab 03/03/2020 0725 03/24/2020 0646 03/07/2020 2245 03/27/20 0508 03/28/20 0428  AST 14* 16 3,983* 4,809*  892*  ALT 25 24 2,470* 2,826* 1,303*  ALKPHOS 114 103 56 51 58  BILITOT 0.7 0.9 0.9 1.4* 1.5*  PROT 6.0* 6.0* 3.8* 4.4* 4.8*  ALBUMIN 2.9* 2.9* 2.0* 2.8* 3.1*   No results for input(s): LIPASE, AMYLASE in the last 168 hours. No results for input(s): AMMONIA in the last 168 hours.  ABG    Component Value Date/Time   PHART 7.282 (L) 03/03/2020 2225   PCO2ART 42.1 03/19/2020 2225   PO2ART 78.2 (L) 03/16/2020 2225   HCO3 19.0 (L) 03/13/2020 2225   ACIDBASEDEF 6.3 (H) 03/04/2020 2225   O2SAT 92.0 03/07/2020 2225     Coagulation Profile: Recent Labs  Lab 03/18/2020 2245 03/27/20 1106 03/28/20 0730  INR 3.2* 2.7* 2.0*    Cardiac Enzymes: No results for input(s): CKTOTAL, CKMB, CKMBINDEX, TROPONINI in the last 168 hours.  HbA1C: Hgb A1c MFr Bld  Date/Time Value Ref Range Status  03/04/2020 07:25 AM 6.0 (H) 4.8 - 5.6 % Final    Comment:    (NOTE) Pre diabetes:          5.7%-6.4%  Diabetes:              >6.4%  Glycemic control for   <7.0% adults with diabetes     CBG: Recent Labs  Lab 03/27/20 2026 03/28/20 0010 03/28/20 0442 03/28/20 0753 03/28/20 1201  GLUCAP 102* 121* 109* 106* 106*     The patient is critically ill with multiple organ systems failure and requires high complexity decision making for assessment and support, frequent evaluation and titration of therapies, application of advanced monitoring technologies and extensive interpretation of multiple databases. Critical Care Time devoted to patient care services described in this note is 35 minutes.    Christinia Gully, MD Pulmonary and [place] [phone]   After 7:00 pm call [facility]  [phone]

## 2020-03-28 NOTE — Consult Note (Deleted)
NAME:  Manuel Richard, MRN:  [id number], DOB:  [birth year], LOS: 3 ADMISSION DATE:  03/03/2020, CONSULTATION DATE: 03/27/20 (emergency basis as discharged from pulmonary clinic for beligerent behavior)  REFERRING MD:  Wynetta Emery, [facility] , CHIEF COMPLAINT:  GI sepsis, resp failure   Brief History   72 yowm former smoker with extensive pmhx admit 10/30 with apparent aecopd then developed abd pain 10/31 > dx Perf viscus   >>> returned on vent  ex-lap for perforated sigmoid c/b feculent peritonitis and is now s/p sigmoid resection and ostomy creation with circulatory shock, met acidosis and decreased uop on vent for resp failure and PCCM service asked to consult 11/1   History of present illness   From epic notes:  49 y.o. male with medical history significant very severe COPD, longtime smoker with nicotine dependence, chronic pain, diastolic CHF, GERD, hyperlipidemia, supplemental oxygen dependent (2-4L/min), chronic dyspnea who had been followed by pulmonary clinic until he was dismissed from the practice due to belligerent behaviors.  He has been having a difficult time with chronic shortness of breath with acute exacerbations over the past month with multiple emergency department visits.  He was most recently seen in the emergency department on 10/16 where he was treated for an acute exacerbation of COPD and sent home.  He returned to the emergency department today with symptoms of progressive shortness of breath difficulty speaking and poor exercise tolerance.  He has been having cough and shortness of breath with yellowish sputum production.  He also reports chronic leg edema which is mostly unchanged.  He says that his shortness of breath woke him up in the middle of the night.  He is not lying flat recumbent.  His main complaint is the shortness of breath.  He is breathing very fast.  He is adamant that he stopped smoking 1 month ago.  He has a 31+ year history of smoking up to 2 packs a day.  ED  Course: The patient was tachycardic on arrival with a heart rate in the 120 range.  Patient was tachypneic with a respiratory rate of 21.  Temperature 98.6.  Pulse ox 99% on 3 L nasal cannula.  Blood pressure 111/90.  Patient was noted to have hypokalemia with a potassium of 2.8.  BUN 25 creatinine 0.60 calcium 8.5 albumin 2.9 magnesium 1.5 total protein 6.0 AST 14 ALT 25.  Cardiac BNP 73.0.  WBC 18.7.  Hemoglobin 11.2.  Platelet count 224.  Influenza a and B testing negative SARS 2 coronavirus test negative.  Chest x-ray no acute findings.  CT angio chest nondiagnostic beyond main pulmonary arteries due to poor quality study.  EKG with findings of sinus tachycardia.  Patient was given continuous nebulizer treatments.  Patient was given IV magnesium and potassium.  The patient was monitored in the emergency department for several hours however continued to have tachypnea and tachycardia.  He continues to have shortness of breath symptoms.  Admission was requested for further evaluation and management COPD exacerbation acute on chronic respiratory failure with hypoxia.   Past Medical History    Anemia, CHF (congestive heart failure) (HCC), Chronic back pain, Chronic pain syndrome, Chronic pain syndrome, Chronic sinusitis, COPD (chronic obstructive pulmonary disease) (no pfts in [place])  Coronary artery disease, Dyslipidemia, Dysphagia, Epididymitis, Erectile dysfunction, Fatigue, GERD (gastroesophageal reflux disease), Groin pain, High cholesterol, Hydrocele, Lumbar radiculopathy, Nicotine addiction, Peripheral vascular disease ([place]), Rectal bleeding, Shortness of breath dyspnea, Stroke ([place]) (2006), and Testicular pain.   Significant Hospital Events  OR pm 10/31: feculent peritonitis/ sigmoid perf   Consults:  Gen surgery  10/31 >>> PCCM   11/1 >>>  Procedures:  R IJ  10/31     >>> Oral et 10/31 >>> L RA  art line 10/31 >>>   Significant Diagnostic Tests:    Micro Data:  COVID 19  10/31 PCR  neg MRSA  10/31   PCR  Neg  Abd wound 10/31 >>>  Antimicrobials:  Doxy 10/30 only Cefepime 10/31 >>> Flagyl  10/31 >>>   Scheduled Meds: . sodium chloride   Intravenous Once  . buPROPion  150 mg Oral Daily  . chlorhexidine gluconate (MEDLINE KIT)  15 mL Mouth Rinse BID  . Chlorhexidine Gluconate Cloth  6 each Topical Daily  . docusate  100 mg Per Tube BID  . fluticasone  2 spray Each Nare Daily  . furosemide  30 mg Intravenous Daily  . hydrocortisone sodium succinate  100 mg Intravenous Q12H  . insulin aspart  0-9 Units Subcutaneous Q4H  . levalbuterol  0.63 mg Nebulization Q6H  . mouth rinse  15 mL Mouth Rinse 10 times per day  . pantoprazole (PROTONIX) IV  40 mg Intravenous Q24H  . polyethylene glycol  17 g Per Tube Daily  . venlafaxine XR  75 mg Oral Daily   Continuous Infusions: . anidulafungin     Followed by  . [START ON 03/29/2020] anidulafungin    . ceFEPime (MAXIPIME) IV 200 mL/hr at 03/28/20 0531  . fentaNYL infusion INTRAVENOUS 225 mcg/hr (03/28/20 0531)  . metronidazole 500 mg (03/28/20 0531)  . norepinephrine (LEVOPHED) Adult infusion Stopped (03/27/20 2257)  .  sodium bicarbonate (isotonic) infusion in sterile water     PRN Meds:.albuterol, fentaNYL (SUBLIMAZE) injection, fentaNYL (SUBLIMAZE) injection, midazolam, nicotine, ondansetron **OR** ondansetron (ZOFRAN) IV   Interim history/subjective:  Comfortable on present setting breathing near back up rate s air trapping on graphics  Objective   Blood pressure 119/78, pulse 84, temperature 98.9 F (37.2 C), temperature source Axillary, resp. rate 20, height 6' (1.829 m), weight 110.7 kg, SpO2 96 %. CVP:  [7 mmHg-15 mmHg] 10 mmHg  Vent Mode: PRVC FiO2 (%):  [40 %] 40 % Set Rate:  [20 bmp] 20 bmp Vt Set:  [620 mL] 620 mL PEEP:  [5 cmH20] 5 cmH20 Plateau Pressure:  [15 cmH20-19 cmH20] 17 cmH20   Intake/Output Summary (Last 24 hours) at 03/28/2020 1348 Last data filed at 03/28/2020 1119 Gross per 24 hour    Intake 4093.55 ml  Output 850 ml  Net 3243.55 ml   Filed Weights   03/17/2020 0300 03/19/2020 1000 03/27/20 0600  Weight: 102.3 kg 103.2 kg 110.7 kg   CVP:  [7 mmHg-15 mmHg] 10 mmHg   Examination: Tmax  98.9  General: comfortabel on vent  HENT: oral et/OG   Lungs: distant bs  Cardiovascular: RRR no S3/m Abdomen: mod distended, relatively soft, min OG output Extremities:warm  2+ pitting/ pas  Neuro: sedated on fent/ diprovan    I personally reviewed images and agree with radiology impression as follows:  CXR:   Portable  11/2 1. Lines and tubes stable position. 2. Cardiomegaly. 3. Low lung volumes with progressive bibasilar atelectasis and infiltrates/edema.      Resolved Hospital Problem list      Assessment & Plan:  1)  GI sepsis secondary to perf sig with feculent peritonitis - no evidence of abd compartment syndrome  >> off pressors, cvp adequate   2) Acute vent dep resp failure  -  h/o copd  No air trapping but decreased aeration bases predictable given low Cabd so no weaning feasible for now    3) Oliguric AKI secondary to 1)     Lab Results  Component Value Date   CREATININE 3.21 (H) 03/28/2020   CREATININE 2.41 (H) 03/27/2020   CREATININE 2.27 (H) 02/29/2020     Hopefully has peaked as uop up / renal eval no change x added hc03 to IVF  4) thrombocytopenia in pt with sepsis and underlying cirrhosis Lab Results  Component Value Date   PLT 58 (L) 03/28/2020   PLT 58 (L) 03/28/2020   PLT 106 (L) 03/27/2020   - no evidence dc clinically so 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 Early dic vs liver failure Lab Results  Component Value Date   INR 2.0 (H) 03/28/2020   INR 2.7 (H) 03/27/2020   INR 3.2 (H) 03/23/2020   >> rx  2 units FFP 11/1, no active bleeding so hold off   6) post op anemia? Acute blood loss (nothing obvious ongoing)   Lab Results  Component Value Date   HGB 6.5 (LL) 03/28/2020   HGB 6.2 (LL)  03/28/2020   HGB 8.7 (L) 03/27/2020  threshold to trx = less than 7 or less than 8 with recurrent pressor dep esp if cvp on low side    7) shock liver in pt with cirrhosis by hx - lfts improved 11/2  8) met acidosis s AG  >>>only mild at present > addressed by renal already  Best practice:  Diet: npo Pain/Anxiety/Delirium protocol (if indicated): post op sedation  VAP protocol (if indicated):  DVT prophylaxis: PAS GI prophylaxis: PPI Glucose control: per [facility]  Mobility: SBR Code Status: full code for now  Family Communication: per gen surgery/ [facility]  Disposition: ICU  Labs   CBC: Recent Labs  Lab 03/05/2020 0725 02/25/2020 0725 03/15/2020 0646 03/10/2020 2245 03/27/20 0508 03/28/20 0113 03/28/20 0428  WBC 18.7*   < > 15.8* 6.0 9.9 5.8 6.4  NEUTROABS 15.8*  --  13.1* 4.9 4.9  --  5.8  HGB 11.2*   < > 12.9* 9.7* 8.7* 6.2* 6.5*  HCT 32.9*   < > 39.2 30.2* 26.6* 19.2* 20.5*  MCV 94.3   < > 93.8 98.4 95.7 98.0 99.0  PLT 224   < > 260 119* 106* 58* 58*   < > = values in this interval not displayed.    Basic Metabolic Panel: Recent Labs  Lab 03/09/2020 0725 03/15/2020 0646 03/07/2020 2245 03/27/20 0508 03/28/20 0428  NA 136 135 135 133* 137  K 2.8* 3.2* 4.3 4.4 4.8  CL 98 97* 106 103 107  CO2 27 22 20* 20* 19*  GLUCOSE 137* 264* 91 96 115*  BUN 25* 33* 38* 43* 61*  CREATININE 0.60* 1.12 2.27* 2.41* 3.21*  CALCIUM 8.5* 9.0 7.4* 8.0* 8.2*  MG 1.5* 2.0 2.1 1.8 1.9  PHOS  --   --  6.7*  --  6.7*   GFR: Estimated Creatinine Clearance: 36.1 mL/min (A) (by C-G formula based on SCr of 3.21 mg/dL (H)). Recent Labs  Lab 03/01/2020 2245 03/27/20 0225 03/27/20 0508 03/28/20 0113 03/28/20 0428  WBC 6.0  --  9.9 5.8 6.4  LATICACIDVEN  --  2.3* 2.0*  --   --     Liver Function Tests: Recent Labs  Lab 03/16/2020 0725 03/19/2020 0646 02/29/2020 2245 03/27/20 0508 03/28/20 0428  AST 14* 16 3,983* 4,809*  892*  ALT 25 24 2,470* 2,826* 1,303*  ALKPHOS 114 103 56 51 58  BILITOT 0.7  0.9 0.9 1.4* 1.5*  PROT 6.0* 6.0* 3.8* 4.4* 4.8*  ALBUMIN 2.9* 2.9* 2.0* 2.8* 3.1*   No results for input(s): LIPASE, AMYLASE in the last 168 hours. No results for input(s): AMMONIA in the last 168 hours.  ABG    Component Value Date/Time   PHART 7.282 (L) 03/09/2020 2225   PCO2ART 42.1 03/13/2020 2225   PO2ART 78.2 (L) 03/11/2020 2225   HCO3 19.0 (L) 02/29/2020 2225   ACIDBASEDEF 6.3 (H) 03/12/2020 2225   O2SAT 92.0 03/02/2020 2225     Coagulation Profile: Recent Labs  Lab 03/14/2020 2245 03/27/20 1106 03/28/20 0730  INR 3.2* 2.7* 2.0*    Cardiac Enzymes: No results for input(s): CKTOTAL, CKMB, CKMBINDEX, TROPONINI in the last 168 hours.  HbA1C: Hgb A1c MFr Bld  Date/Time Value Ref Range Status  03/07/2020 07:25 AM 6.0 (H) 4.8 - 5.6 % Final    Comment:    (NOTE) Pre diabetes:          5.7%-6.4%  Diabetes:              >6.4%  Glycemic control for   <7.0% adults with diabetes     CBG: Recent Labs  Lab 03/27/20 2026 03/28/20 0010 03/28/20 0442 03/28/20 0753 03/28/20 1201  GLUCAP 102* 121* 109* 106* 106*     The patient is critically ill with multiple organ systems failure and requires high complexity decision making for assessment and support, frequent evaluation and titration of therapies, application of advanced monitoring technologies and extensive interpretation of multiple databases. Critical Care Time devoted to patient care services described in this note is 35 minutes.    Christinia Gully, MD Pulmonary and [place] [phone]   After 7:00 pm call [facility]  [phone]

## 2020-03-28 NOTE — Consult Note (Signed)
[facility] Renal Consultation Note  Requesting MD: Laural Benesjohnson Indication for Consultation: AKI  HPI:  Manuel Richard is a 49 y.o. male with past medical history significant for severe COPD, diastolic CHF- O2 dep.  He presented to the hospital on 10/30 with SOB.  Deteriorated quickly- had leukocytosis then developed some hemodynamic instability.  Abdominal CT c/w perforated viscous so taken to OR on 10/31- found to have perforated sigmoid colon with abscess- s/p resection and ostomy formation. Post op- he remained hypotensive req pressors.  Renal function which was normal at baseline started to rise.  He was anuric for a time but now is starting to make some urine... 625 in last 12 hours  Crt today is 3.2- he remains on the vent and sedated.  Even though is reportedly 10 liters positive does not seem that overloaded  Creatinine, Ser  Date/Time Value Ref Range Status  03/28/2020 04:28 AM 3.21 (H) 0.61 - 1.24 mg/dL Final  40/98/119111/05/2019 47:8205:08 AM 2.41 (H) 0.61 - 1.24 mg/dL Final  95/62/130810/11/2019 65:7810:45 PM 2.27 (H) 0.61 - 1.24 mg/dL Final    Comment:    DELTA CHECK NOTED  03/12/2020 06:46 AM 1.12 0.61 - 1.24 mg/dL Final  46/96/295210/30/2021 84:1307:25 AM 0.60 (L) 0.61 - 1.24 mg/dL Final  24/40/102710/15/2021 25:3609:00 PM 0.65 0.61 - 1.24 mg/dL Final  64/40/347410/01/2020 25:9511:51 PM 0.59 (L) 0.61 - 1.24 mg/dL Final  63/87/564305/30/2021 32:9505:12 PM 0.92 0.61 - 1.24 mg/dL Final  18/84/166002/21/2021 63:0110:30 PM 0.75 0.61 - 1.24 mg/dL Final  60/10/932302/04/2020 55:7308:13 PM 1.04 0.61 - 1.24 mg/dL Final  22/02/542712/18/2020 06:2303:09 PM 0.70 0.61 - 1.24 mg/dL Final  76/28/315105/23/2020 76:1606:49 PM 0.85 0.61 - 1.24 mg/dL Final  07/37/106205/22/2020 69:4805:46 PM 0.65 0.61 - 1.24 mg/dL Final  54/62/703501/03/2019 00:9303:52 PM 0.74 0.61 - 1.24 mg/dL Final  81/82/993711/25/2019 16:9610:45 AM 0.96 0.76 - 1.27 mg/dL Final     PMHx:   Past Medical History:  Diagnosis Date  . Anemia   . CHF (congestive heart failure) (HCC)   . Chronic back pain   . Chronic pain syndrome   . Chronic pain syndrome   . Chronic sinusitis   . COPD (chronic  obstructive pulmonary disease) (HCC)   . Coronary artery disease   . Dyslipidemia   . Dysphagia   . Epididymitis    Chlamydial, Right  . Erectile dysfunction   . Fatigue   . GERD (gastroesophageal reflux disease)   . Groin pain   . High cholesterol   . Hydrocele    bilateral  . Lumbar radiculopathy   . Nicotine addiction   . Peripheral vascular disease (HCC)   . Rectal bleeding   . Shortness of breath dyspnea   . Stroke (HCC) 2006   x 2     . Testicular pain     Past Surgical History:  Procedure Laterality Date  . APPENDECTOMY      Family Hx:  Family History  Problem Relation Age of Onset  . Cancer Mother   . Cancer Father   . Colon cancer Neg Hx   . Colon polyps Neg Hx     Social History:  reports that he has quit smoking. He smoked 0.00 packs per day for 31.00 years. He has never used smokeless tobacco. He reports that he does not drink alcohol and does not use drugs.  Allergies: No Known Allergies  Medications: Prior to Admission medications   Medication Sig Start Date End Date Taking? Authorizing Provider  albuterol (PROVENTIL HFA;VENTOLIN HFA) 108 (90  Base) MCG/ACT inhaler Inhale 2 puffs into the lungs every 6 (six) hours as needed for wheezing or shortness of breath.   Yes [provider]  aspirin EC 81 MG tablet Take 81 mg by mouth daily.   Yes [provider]  atorvastatin (LIPITOR) 40 MG tablet Take 40 mg by mouth daily at 6 PM.  08/16/18  Yes [provider]  buPROPion (WELLBUTRIN XL) 150 MG 24 hr tablet Take 150 mg by mouth daily.   Yes [provider]  fenofibrate (TRICOR) 145 MG tablet Take 145 mg by mouth daily. 01/23/20  Yes [provider]  fluticasone (FLONASE) 50 MCG/ACT nasal spray Place 2 sprays into both nostrils daily. 02/09/20  Yes [provider]  furosemide (LASIX) 20 MG tablet Take 20 mg by mouth daily.  12/24/17  Yes [provider]  HYDROcodone-acetaminophen (NORCO) 10-325 MG  tablet Take 1 tablet by mouth every 4 (four) hours as needed for pain. 07/02/19  Yes [provider]  ibuprofen (ADVIL) 800 MG tablet Take 800 mg by mouth 3 (three) times daily. 03/24/20  Yes [provider]  ipratropium-albuterol (DUONEB) 0.5-2.5 (3) MG/3ML SOLN Take 3 mLs by nebulization 3 (three) times daily.   Yes [provider]  LINZESS 290 MCG CAPS capsule Take 290 mcg by mouth daily. 05/04/19  Yes [provider]  methylPREDNISolone (MEDROL DOSEPAK) 4 MG TBPK tablet Take by mouth. 03/24/20  Yes [provider]  omeprazole (PRILOSEC) 40 MG capsule Take 40 mg by mouth daily. 01/11/20  Yes [provider]  sildenafil (REVATIO) 20 MG tablet Take 5 tablets by mouth as needed. 02/22/18  Yes [provider]  STIOLTO RESPIMAT 2.5-2.5 MCG/ACT AERS Inhale 2 puffs into the lungs daily. 07/05/18  Yes [provider]  ezetimibe (ZETIA) 10 MG tablet Take 10 mg by mouth at bedtime. 03/17/19   [provider]  Fluticasone-Salmeterol (ADVAIR DISKUS) 250-50 MCG/DOSE AEPB Inhale 1 puff into the lungs in the morning and at bedtime. Patient not taking: Reported on 03/10/2020 10/24/19   Burgess Amor, PA-C  venlafaxine XR (EFFEXOR-XR) 75 MG 24 hr capsule Take 75 mg by mouth daily. 03/08/20   [provider]    I have reviewed the patient's current medications.  Labs:  Results for orders placed or performed during the hospital encounter of 03/03/2020 (from the past 48 hour(s))  MRSA PCR Screening     Status: None   Collection Time: 2020-04-20 10:33 AM   Specimen: Nasal Mucosa; Nasopharyngeal  Result Value Ref Range   MRSA by PCR NEGATIVE NEGATIVE    Comment:        The GeneXpert MRSA Assay (FDA approved for NASAL specimens only), is one component of a comprehensive MRSA colonization surveillance program. It is not intended to diagnose MRSA infection nor to guide or monitor treatment for MRSA infections. Performed at [facility], [address] [place], [place] [address]   Glucose, capillary     Status: Abnormal   Collection Time: 2020/04/20 11:06 AM  Result Value Ref Range   Glucose-Capillary 322 (H) 70 - 99 mg/dL    Comment: Glucose reference range applies only to samples taken after fasting for at least 8 hours.  Blood gas, arterial     Status: Abnormal   Collection Time: 04/20/2020 11:55 AM  Result Value Ref Range   FIO2 50.00    pH, Arterial 7.461 (H) 7.35 - 7.45   pCO2 arterial 20.6 (L) 32 - 48 mmHg   pO2,  Arterial 126 (H) 83 - 108 mmHg   Bicarbonate 18.7 (L) 20.0 - 28.0 mmol/L   Acid-base deficit 8.7 (H) 0.0 - 2.0 mmol/L   O2 Saturation 98.8 %   Patient temperature 37.0    Allens test (pass/fail) RADIAL (A) PASS    Comment: Performed at [facility], [address], [place], [place] [address]  Glucose, capillary     Status: Abnormal   Collection Time: 03/17/2020  4:14 PM  Result Value Ref Range   Glucose-Capillary 170 (H) 70 - 99 mg/dL    Comment: Glucose reference range applies only to samples taken after fasting for at least 8 hours.  Aerobic/Anaerobic Culture (surgical/deep wound)     Status: None (Preliminary result)   Collection Time: 02/28/2020  7:30 PM   Specimen: Abdomen; Wound  Result Value Ref Range   Specimen Description      ABDOMEN Performed at [facility], [address] [place], [place] [address]    Special Requests      NONE Performed at [facility], [address], [place], [place] [address]    Gram Stain      RARE WBC PRESENT,BOTH PMN AND MONONUCLEAR FEW YEAST    Culture      CULTURE REINCUBATED FOR BETTER GROWTH Performed at [facility], [address] [place], [place] [address]    Report Status PENDING   Blood gas, arterial     Status: Abnormal   Collection Time: 03/23/2020 10:25 PM  Result Value Ref Range   FIO2 40.00    pH, Arterial 7.282 (L) 7.35 - 7.45   pCO2 arterial 42.1 32 - 48 mmHg   pO2, Arterial 78.2 (L) 83 - 108 mmHg   Bicarbonate 19.0 (L) 20.0 -  28.0 mmol/L   Acid-base deficit 6.3 (H) 0.0 - 2.0 mmol/L   O2 Saturation 92.0 %   Patient temperature 37.0    Allens test (pass/fail) NOT INDICATED (A) PASS    Comment: Performed at [facility], [address], [place], [place] [address]  Protime-INR     Status: Abnormal   Collection Time: 02/27/2020 10:45 PM  Result Value Ref Range   Prothrombin Time 31.4 (H) 11.4 - 15.2 seconds   INR 3.2 (H) 0.8 - 1.2    Comment: (NOTE) INR goal varies based on device and disease states. Performed at [facility], [address] [place], [place] [address]   APTT     Status: Abnormal   Collection Time: 03/19/2020 10:45 PM  Result Value Ref Range   aPTT 43 (H) 24 - 36 seconds    Comment:        IF BASELINE aPTT IS ELEVATED, SUGGEST PATIENT RISK ASSESSMENT BE USED TO DETERMINE APPROPRIATE ANTICOAGULANT THERAPY. Performed at [facility], [address], [place], [place] [address]   CBC with Differential     Status: Abnormal   Collection Time: 03/19/2020 10:45 PM  Result Value Ref Range   WBC 6.0 4.0 - 10.5 K/uL   RBC 3.07 (L) 4.22 - 5.81 MIL/uL   Hemoglobin 9.7 (L) 13.0 - 17.0 g/dL   HCT 32.4 (L) 39 - 52 %   MCV 98.4 80.0 - 100.0 fL   MCH 31.6 26.0 - 34.0 pg   MCHC 32.1 30.0 - 36.0 g/dL   RDW 40.1 02.7 - 25.3 %   Platelets 119 (L) 150 - 400 K/uL   nRBC 1.0 (H) 0.0 - 0.2 %   Neutrophils Relative % 82 %   Neutro Abs 4.9 1.7 -  7.7 K/uL   Lymphocytes Relative 13 %   Lymphs Abs 0.8 0.7 - 4.0 K/uL   Monocytes Relative 3 %   Monocytes Absolute 0.2 0.1 - 1.0 K/uL   Eosinophils Relative 0 %   Eosinophils Absolute 0.0 0.0 - 0.5 K/uL   Basophils Relative 1 %   Basophils Absolute 0.1 0.0 - 0.1 K/uL   WBC Morphology Abnormal lymphocytes present     Comment: MILD LEFT SHIFT (1-5% METAS, OCC MYELO, OCC BANDS)   Immature Granulocytes 1 %   Abs Immature Granulocytes 0.08 (H) 0.00 - 0.07 K/uL    Comment: Performed at [facility], [address], [place], [place] [address]  Comprehensive metabolic panel      Status: Abnormal   Collection Time: 02/28/2020 10:45 PM  Result Value Ref Range   Sodium 135 135 - 145 mmol/L   Potassium 4.3 3.5 - 5.1 mmol/L    Comment: DELTA CHECK NOTED   Chloride 106 98 - 111 mmol/L   CO2 20 (L) 22 - 32 mmol/L   Glucose, Bld 91 70 - 99 mg/dL    Comment: Glucose reference range applies only to samples taken after fasting for at least 8 hours.   BUN 38 (H) 6 - 20 mg/dL   Creatinine, Ser 6.04 (H) 0.61 - 1.24 mg/dL    Comment: DELTA CHECK NOTED   Calcium 7.4 (L) 8.9 - 10.3 mg/dL   Total Protein 3.8 (L) 6.5 - 8.1 g/dL   Albumin 2.0 (L) 3.5 - 5.0 g/dL   AST 5,409 (H) 15 - 41 U/L    Comment: RESULTS CONFIRMED BY MANUAL DILUTION   ALT 2,470 (H) 0 - 44 U/L   Alkaline Phosphatase 56 38 - 126 U/L   Total Bilirubin 0.9 0.3 - 1.2 mg/dL   GFR, Estimated 35 (L) >60 mL/min    Comment: (NOTE) Calculated using the CKD-EPI Creatinine Equation (2021)    Anion gap 9 5 - 15    Comment: Performed at [facility], [address], [place], [place] [address]  Magnesium     Status: None   Collection Time: 03/02/2020 10:45 PM  Result Value Ref Range   Magnesium 2.1 1.7 - 2.4 mg/dL    Comment: Performed at [facility], [address], [place], [place] [address]  Phosphorus     Status: Abnormal   Collection Time: 02/27/2020 10:45 PM  Result Value Ref Range   Phosphorus 6.7 (H) 2.5 - 4.6 mg/dL    Comment: Performed at [facility], [address], [place], [place] [address]  Glucose, capillary     Status: None   Collection Time: 03/27/20 12:03 AM  Result Value Ref Range   Glucose-Capillary 75 70 - 99 mg/dL    Comment: Glucose reference range applies only to samples taken after fasting for at least 8 hours.  Lactic acid, plasma     Status: Abnormal   Collection Time: 03/27/20  2:25 AM  Result Value Ref Range   Lactic Acid, Venous 2.3 (HH) 0.5 - 1.9 mmol/L    Comment: CRITICAL RESULT CALLED TO, READ BACK BY AND VERIFIED WITH: DANIELS,J @ 0254 ON 03/27/20 BY JUW Performed at [facility], [address], [place], [place] [address]   Glucose, capillary     Status: None   Collection Time: 03/27/20  4:45 AM  Result Value Ref Range   Glucose-Capillary 88 70 - 99 mg/dL    Comment: Glucose reference range applies only to samples taken after fasting for at least 8  hours.  Comprehensive metabolic panel     Status: Abnormal   Collection Time: 03/27/20  5:08 AM  Result Value Ref Range   Sodium 133 (L) 135 - 145 mmol/L   Potassium 4.4 3.5 - 5.1 mmol/L   Chloride 103 98 - 111 mmol/L   CO2 20 (L) 22 - 32 mmol/L   Glucose, Bld 96 70 - 99 mg/dL    Comment: Glucose reference range applies only to samples taken after fasting for at least 8 hours.   BUN 43 (H) 6 - 20 mg/dL   Creatinine, Ser 9.32 (H) 0.61 - 1.24 mg/dL   Calcium 8.0 (L) 8.9 - 10.3 mg/dL   Total Protein 4.4 (L) 6.5 - 8.1 g/dL   Albumin 2.8 (L) 3.5 - 5.0 g/dL   AST 6,712 (H) 15 - 41 U/L    Comment: RESULTS CONFIRMED BY MANUAL DILUTION   ALT 2,826 (H) 0 - 44 U/L    Comment: RESULTS CONFIRMED BY MANUAL DILUTION   Alkaline Phosphatase 51 38 - 126 U/L   Total Bilirubin 1.4 (H) 0.3 - 1.2 mg/dL   GFR, Estimated 32 (L) >60 mL/min    Comment: (NOTE) Calculated using the CKD-EPI Creatinine Equation (2021)    Anion gap 10 5 - 15    Comment: Performed at [facility], [address] [place], [place] [address]  Magnesium     Status: None   Collection Time: 03/27/20  5:08 AM  Result Value Ref Range   Magnesium 1.8 1.7 - 2.4 mg/dL    Comment: Performed at [facility], [address] [place], [place] [address]  CBC WITH DIFFERENTIAL     Status: Abnormal   Collection Time: 03/27/20  5:08 AM  Result Value Ref Range   WBC 9.9 4.0 - 10.5 K/uL   RBC 2.78 (L) 4.22 - 5.81 MIL/uL   Hemoglobin 8.7 (L) 13.0 - 17.0 g/dL   HCT 25.0 (L) 39 - 52 %   MCV 95.7 80.0 - 100.0 fL   MCH 31.3 26.0 - 34.0 pg   MCHC 32.7 30.0 - 36.0 g/dL   RDW 53.9 76.7 - 34.1 %   Platelets 106 (L) 150 - 400 K/uL    Comment: REPEATED TO VERIFY PLATELET  COUNT CONFIRMED BY SMEAR SPECIMEN CHECKED FOR CLOTS Immature Platelet Fraction may be clinically indicated, consider ordering this additional test [id number]    nRBC 0.3 (H) 0.0 - 0.2 %   Neutrophils Relative % 33 %   Neutro Abs 4.9 1.7 - 7.7 K/uL   Band Neutrophils 16 %   Lymphocytes Relative 13 %   Lymphs Abs 1.3 0.7 - 4.0 K/uL   Monocytes Relative 2 %   Monocytes Absolute 0.2 0.1 - 1.0 K/uL   Eosinophils Relative 0 %   Eosinophils Absolute 0.0 0.0 - 0.5 K/uL   Basophils Relative 0 %   Basophils Absolute 0.0 0.0 - 0.1 K/uL   WBC Morphology      MODERATE LEFT SHIFT (>5% METAS AND MYELOS,OCC PRO NOTED)   Metamyelocytes Relative 31 %   Myelocytes 5 %   Dohle Bodies PRESENT     Comment: Performed at [facility], [address], [place], [place] [address]  Lactic acid, plasma     Status: Abnormal   Collection Time: 03/27/20  5:08 AM  Result Value Ref Range   Lactic Acid, Venous 2.0 (HH) 0.5 - 1.9 mmol/L    Comment: CRITICAL RESULT CALLED TO, READ BACK BY AND VERIFIED WITH: DANIELS,J@0635  by MATTHEWS, B 11.1.2021  Performed at [facility], [address], [place], [place] [address]   Triglycerides     Status: None   Collection Time: 03/27/20  5:08 AM  Result Value Ref Range   Triglycerides 69 <150 mg/dL    Comment: Performed at [facility], [address], [place], [place] [address]  Glucose, capillary     Status: None   Collection Time: 03/27/20  7:38 AM  Result Value Ref Range   Glucose-Capillary 93 70 - 99 mg/dL    Comment: Glucose reference range applies only to samples taken after fasting for at least 8 hours.  Protime-INR     Status: Abnormal   Collection Time: 03/27/20 11:06 AM  Result Value Ref Range   Prothrombin Time 27.8 (H) 11.4 - 15.2 seconds   INR 2.7 (H) 0.8 - 1.2    Comment: (NOTE) INR goal varies based on device and disease states. Performed at [facility], [address], [place], [place] [address]   Glucose, capillary     Status: None   Collection  Time: 03/27/20 11:12 AM  Result Value Ref Range   Glucose-Capillary 99 70 - 99 mg/dL    Comment: Glucose reference range applies only to samples taken after fasting for at least 8 hours.  Prepare fresh frozen plasma     Status: None   Collection Time: 03/27/20  2:47 PM  Result Value Ref Range   Unit Number [id number]    Blood Component Type THW PLS APHR    Unit division B0    Status of Unit ISSUED,FINAL    Transfusion Status OK TO TRANSFUSE    Unit Number [id number]    Blood Component Type THAWED PLASMA    Unit division 00    Status of Unit ISSUED,FINAL    Transfusion Status      OK TO TRANSFUSE Performed at [facility], [address] [place], [place] [address]   Type and screen [facility]     Status: None (Preliminary result)   Collection Time: 03/27/20  2:47 PM  Result Value Ref Range   ABO/RH(D) O POS    Antibody Screen NEG    Sample Expiration 03/30/2020,2359    Unit Number [id number]    Blood Component Type RED CELLS,LR    Unit division 00    Status of Unit ISSUED    Transfusion Status OK TO TRANSFUSE    Crossmatch Result Compatible    Unit Number [id number]    Blood Component Type RED CELLS,LR    Unit division 00    Status of Unit ISSUED    Transfusion Status OK TO TRANSFUSE    Crossmatch Result      Compatible Performed at [facility], [address], [place], [place] [address]   Glucose, capillary     Status: Abnormal   Collection Time: 03/27/20  4:30 PM  Result Value Ref Range   Glucose-Capillary 104 (H) 70 - 99 mg/dL    Comment: Glucose reference range applies only to samples taken after fasting for at least 8 hours.  Glucose, capillary     Status: Abnormal   Collection Time: 03/27/20  8:26 PM  Result Value Ref Range   Glucose-Capillary 102 (H) 70 - 99 mg/dL    Comment: Glucose reference range applies only to samples taken after fasting for at least 8 hours.  Glucose, capillary     Status: Abnormal   Collection Time: 03/28/20 12:10  AM  Result Value Ref Range   Glucose-Capillary 121 (H) 70 -  99 mg/dL    Comment: Glucose reference range applies only to samples taken after fasting for at least 8 hours.  CBC     Status: Abnormal   Collection Time: 03/28/20  1:13 AM  Result Value Ref Range   WBC 5.8 4.0 - 10.5 K/uL   RBC 1.96 (L) 4.22 - 5.81 MIL/uL   Hemoglobin 6.2 (LL) 13.0 - 17.0 g/dL    Comment: This critical result has verified and been called to J DANIELS,RN by Jory Sims on 11 02 2021 at 0312, and has been read back.    HCT 19.2 (L) 39 - 52 %   MCV 98.0 80.0 - 100.0 fL   MCH 31.6 26.0 - 34.0 pg   MCHC 32.3 30.0 - 36.0 g/dL   RDW 16.1 09.6 - 04.5 %   Platelets 58 (L) 150 - 400 K/uL    Comment: Immature Platelet Fraction may be clinically indicated, consider ordering this additional test [id number]    nRBC 0.0 0.0 - 0.2 %    Comment: Performed at [facility], [address], [place], [place] [address]  Prepare RBC (crossmatch)     Status: None   Collection Time: 03/28/20  3:25 AM  Result Value Ref Range   Order Confirmation      ORDER PROCESSED BY BLOOD BANK Performed at [facility], [address], [place], [place] [address]   Comprehensive metabolic panel     Status: Abnormal   Collection Time: 03/28/20  4:28 AM  Result Value Ref Range   Sodium 137 135 - 145 mmol/L   Potassium 4.8 3.5 - 5.1 mmol/L   Chloride 107 98 - 111 mmol/L   CO2 19 (L) 22 - 32 mmol/L   Glucose, Bld 115 (H) 70 - 99 mg/dL    Comment: Glucose reference range applies only to samples taken after fasting for at least 8 hours.   BUN 61 (H) 6 - 20 mg/dL   Creatinine, Ser 0.86 (H) 0.61 - 1.24 mg/dL   Calcium 8.2 (L) 8.9 - 10.3 mg/dL   Total Protein 4.8 (L) 6.5 - 8.1 g/dL   Albumin 3.1 (L) 3.5 - 5.0 g/dL   AST 578 (H) 15 - 41 U/L   ALT 1,303 (H) 0 - 44 U/L   Alkaline Phosphatase 58 38 - 126 U/L   Total Bilirubin 1.5 (H) 0.3 - 1.2 mg/dL   GFR, Estimated 23 (L) >60 mL/min    Comment: (NOTE) Calculated using the CKD-EPI Creatinine  Equation (2021)    Anion gap 11 5 - 15    Comment: Performed at [facility], [address] [place], [place] [address]  Magnesium     Status: None   Collection Time: 03/28/20  4:28 AM  Result Value Ref Range   Magnesium 1.9 1.7 - 2.4 mg/dL    Comment: Performed at [facility], [address], [place], [place] [address]  CBC WITH DIFFERENTIAL     Status: Abnormal   Collection Time: 03/28/20  4:28 AM  Result Value Ref Range   WBC 6.4 4.0 - 10.5 K/uL   RBC 2.07 (L) 4.22 - 5.81 MIL/uL   Hemoglobin 6.5 (LL) 13.0 - 17.0 g/dL    Comment: This critical result has verified and been called to DANIELS, J by Anson Fret on 11 02 2021 at 0720, and has been read back. Called by Ashley Royalty, B   HCT 20.5 (L) 39 - 52 %   MCV 99.0 80.0 - 100.0 fL   MCH 31.4 26.0 -  34.0 pg   MCHC 31.7 30.0 - 36.0 g/dL   RDW 16.1 09.6 - 04.5 %   Platelets 58 (L) 150 - 400 K/uL    Comment: PLATELET COUNT CONFIRMED BY SMEAR SPECIMEN CHECKED FOR CLOTS Immature Platelet Fraction may be clinically indicated, consider ordering this additional test [id number]    nRBC 0.3 (H) 0.0 - 0.2 %   Neutrophils Relative % 81 %   Neutro Abs 5.8 1.7 - 7.7 K/uL   Band Neutrophils 9 %   Lymphocytes Relative 6 %   Lymphs Abs 0.4 (L) 0.7 - 4.0 K/uL   Monocytes Relative 3 %   Monocytes Absolute 0.2 0.1 - 1.0 K/uL   Eosinophils Relative 0 %   Eosinophils Absolute 0.0 0.0 - 0.5 K/uL   Basophils Relative 0 %   Basophils Absolute 0.0 0.0 - 0.1 K/uL   WBC Morphology MILD LEFT SHIFT (1-5% METAS, OCC MYELO, OCC BANDS)     Comment: TOXIC GRANULATION VACUOLATED NEUTROPHILS    RBC Morphology ANISOCYTOSIS    Metamyelocytes Relative 1 %   Dohle Bodies PRESENT    Polychromasia PRESENT     Comment: Performed at [facility], [address] [place], [place] [address]  Triglycerides     Status: None   Collection Time: 03/28/20  4:28 AM  Result Value Ref Range   Triglycerides 98 <150 mg/dL    Comment: Performed at [facility], [address], [place], [place] [address]  Phosphorus     Status: Abnormal   Collection Time: 03/28/20  4:28 AM  Result Value Ref Range   Phosphorus 6.7 (H) 2.5 - 4.6 mg/dL    Comment: Performed at [facility], [address] [place], [place] [address]  Glucose, capillary     Status: Abnormal   Collection Time: 03/28/20  4:42 AM  Result Value Ref Range   Glucose-Capillary 109 (H) 70 - 99 mg/dL    Comment: Glucose reference range applies only to samples taken after fasting for at least 8 hours.  Protime-INR     Status: Abnormal   Collection Time: 03/28/20  7:30 AM  Result Value Ref Range   Prothrombin Time 22.4 (H) 11.4 - 15.2 seconds   INR 2.0 (H) 0.8 - 1.2    Comment: (NOTE) INR goal varies based on device and disease states. Performed at [facility], [address] [place], [place] [address]   Glucose, capillary     Status: Abnormal   Collection Time: 03/28/20  7:53 AM  Result Value Ref Range   Glucose-Capillary 106 (H) 70 - 99 mg/dL    Comment: Glucose reference range applies only to samples taken after fasting for at least 8 hours.     ROS:  Review of systems not obtained due to patient factors.  Physical Exam: Vitals:   03/28/20 0842 03/28/20 0855  BP:  119/80  Pulse:  91  Resp:  20  Temp:  98 F (36.7 C)  SpO2: 94%      General: well nourished, sedated on vent HEENT: PERRLA, EOMI Neck: no JVD Heart: RRR Lungs: ant clear-  40% Fio2 Abdomen: distended-  Post op- ostomy Extremities: min edema Skin: warm and dry Neuro: sedate on vent  Assessment/Plan: 49 year old WM with severe COPD and pulm HTN-  Presented with what was thought to be COPD exacerbation-  Developed sepsis- found to have perforated sigmoid colon s/p operative intervention with post op hypotension and AKI 1.Renal- AKI in the setting of sepsis/s/p operative intervention for perforated  sigmoid colon.  Likely ATN.  Fortunately there are no acute indications for RRT at this time and his hemodynamics seem to be  improved with UOP happening.  Hopefully since had very normal renal function at baseline will be able to escape the prospect of dialysis  2. Hypertension/volume  - has been volume resuscitated with good results-  CVP in the low teens-  Has been getting NS at 150 per hour, will decrease and change to bicarb based IVF 3. Perforated viscous- s/o resection and ostomy 4. Metabolic acidosis-  Due to #1-  Will give some iv bicarb 5. VDRF-  Per CCM-  Has baseline bad lung and heart pathology- likely will make a difficult vent wean 7. Anemia  - situational but severe-  Getting blood today    Cecille Aver 03/28/2020, 10:12 AM

## 2020-03-28 NOTE — Telephone Encounter (Signed)
Patient wife called to cancel patient procedure, he is in the hospital in a medically induced coma

## 2020-03-28 NOTE — Telephone Encounter (Signed)
Noted. Sent endo a message to cancel. FYI to [employer]

## 2020-03-28 NOTE — Progress Notes (Signed)
PROGRESS NOTE   Manuel Richard  OMV:[id number] DOB: 1970-07-30 DOA: 03/07/2020 PCP: Martin Majestic, FNP   Chief Complaint  Patient presents with  . Shortness of Breath   Brief Admission History:  49 y.o. male with medical history significant very severe COPD, longtime smoker with nicotine dependence, chronic pain, diastolic CHF, GERD, hyperlipidemia, supplemental oxygen dependent (2-4L/min), chronic dyspnea who had been followed by pulmonary clinic until he was dismissed from the practice due to belligerent behaviors.  He has been having a difficult time with chronic shortness of breath with acute exacerbations over the past month with multiple emergency department visits.  He was most recently seen in the emergency department on 1016 where he was treated for an acute exacerbation of COPD and sent home.  He returned to the emergency department today with symptoms of progressive shortness of breath difficulty speaking and poor exercise tolerance.  He has been having cough and shortness of breath with yellowish sputum production.  He also reports chronic leg edema which is mostly unchanged.  He says that his shortness of breath woke him up in the middle of the night.  He is not lying flat recumbent.  His main complaint is the shortness of breath.  He is breathing very fast.  He is adamant that he stopped smoking 1 month ago.  He has a 31+ year history of smoking up to 2 packs a day.  On 10/31 pt developed increasing respiratory distress on the med surg floor.  He was started on bipap therapy and transferred to stepdown ICU on bipap.  Wife confirmed that patient is full code.   Assessment & Plan:   Principal Problem:   COPD with acute exacerbation ([place]) Active Problems:   Perforation of viscus   Drug-seeking behavior   Back pain at L4-L5 level   Dysphagia   Chronic pain syndrome   Dyslipidemia   Vitamin D deficiency   Chronic lower extremity pain (Secondary Area of Pain) (Left)   Long  term current use of opiate analgesic   GERD (gastroesophageal reflux disease)   Acute and chronic respiratory failure with hypoxia (HCC)   Leukocytosis   Sinus tachycardia   Tachypnea   Severe sepsis with septic shock ([place])  1. Severe sepsis on admission with septic shock following admission secondary to abdominal infection/feculent peritonitis - continue supportive measures and broad spectrum antibiotics with cefepime and metronidazole. Follow cultures.   2. POD#2 s/p exploratory lap with lysis of adhesions, sigmoidectomy with left colostomy placement - Pt remains critically ill on pressor therapy with IV norepinephrine. He is NPO.  Will discuss starting TNA with surgery team.  He has central line placed 10/31.  3. Oliguria - Improved. 4. AKI - likely ATN in setting of severe sepsis and hypotension.  Appreciate nephrology consultation.  No acute need for dialysis at this time.   5. Acute respiratory failure with hypoxia-secondary to COPD with acute exacerbation - Pt remains intubated.  He is on IV steroids.  6. Elevated LFTs - Possibly from liver shock. Trending down.  Monitor for hepatorenal syndrome.    7. Leukocytosis-WBC trending down. Follow CBC with diff. 8. Acute blood loss anemia - Transfusing 2 units PRBC.  9. Diastolic CHF - 2D echocardiogram ordered.   10. Thrombocytopenia - likely due to severe sepsis, monitor closely for bleeding. Follow CBC. DC enoxaparin. Monitor for DIC.  11. GERD-Protonix IV ordered for GI protection. 12. Chronic pain-IV fentanyl infusion for sedation.   13. Steroid induced hyperglycemia - well  controlled.  14. Chronic constipation-He said he had been taking Linzess prior to admission.   15. Chronic tobacco abuse-patient reports he quit 1 month ago but had smoked very heavily for many years multiple packs per day.  I have ordered a nicotine patch to use as needed for cravings. 16. Hypokalemia / Hypomagnesemia - replacements given and following.   DVT  prophylaxis: SCDs Code Status: Full Family Communication: wife updated by telephone, she confirmed full code status, asked for [place] to update her only  Disposition Plan: Home when medically stabilized Consults called: PCCM, Surgery, Nephrology Admission status: INP  Status is: Inpatient  Remains inpatient appropriate because:Hemodynamically unstable, IV treatments appropriate due to intensity of illness or inability to take PO and Inpatient level of care appropriate due to severity of illness  Dispo: The patient is from: Home              Anticipated d/c is to: Home              Anticipated d/c date is: > 3 days              Patient currently is not medically stable to d/c.  Consultants:   PCCM   Procedures:   bipap   Antimicrobials:  Doxycycline 10/30>>   Subjective: Pt sedated on vent in ICU.  UOP has improved.  Pt being transfused 2 unit PRBC.  INR down to 2.   Objective: Vitals:   03/28/20 0842 03/28/20 0844 03/28/20 0855 03/28/20 1119  BP:   119/80 119/78  Pulse:   91 84  Resp:   20 20  Temp:   98 F (36.7 C) 98.9 F (37.2 C)  TempSrc:    Axillary  SpO2: 94%   97%  Weight:      Height:  6' (1.829 m)      Intake/Output Summary (Last 24 hours) at 03/28/2020 1159 Last data filed at 03/28/2020 1119 Gross per 24 hour  Intake 4093.55 ml  Output 850 ml  Net 3243.55 ml   Filed Weights   02/29/2020 0300 03/11/2020 1000 03/27/20 0600  Weight: 102.3 kg 103.2 kg 110.7 kg   Examination:  General exam: sedated and intubated in ICU. Chronically ill appearing male, critically ill, appears older than stated age.  Respiratory system: intubated and mechanically ventilated good air movement bilateral with no wheezes or rhonchi heard.  Cardiovascular system: right sided IJ central line in place.  Normal S1 & S2 heard, Mild JVD, No murmurs, rubs, gallops or clicks. 2+ pedal edema bilateral LEs. Gastrointestinal system: Abdomen is obese, slightly less distended, ostomy appears  viable, minimal BS heard, incisional wounds clean and dry.  Central nervous system: sedated on vent.  Extremities: 1+ pretibial edema BLEs, 1-2+ pedal edema bilateral. Diffuse onychomycosis.  Skin: chronic venous stasis changes.   Psychiatry: sedated on vent in ICU.   Data Reviewed: I have personally reviewed following labs and imaging studies  CBC: Recent Labs  Lab 03/01/2020 0725 03/21/2020 0725 03/04/2020 0646 02/26/2020 2245 03/27/20 0508 03/28/20 0113 03/28/20 0428  WBC 18.7*   < > 15.8* 6.0 9.9 5.8 6.4  NEUTROABS 15.8*  --  13.1* 4.9 4.9  --  5.8  HGB 11.2*   < > 12.9* 9.7* 8.7* 6.2* 6.5*  HCT 32.9*   < > 39.2 30.2* 26.6* 19.2* 20.5*  MCV 94.3   < > 93.8 98.4 95.7 98.0 99.0  PLT 224   < > 260 119* 106* 58* 58*   < > = values  in this interval not displayed.    Basic Metabolic Panel: Recent Labs  Lab 03/07/2020 0725 03/04/2020 0646 02/25/2020 2245 03/27/20 0508 03/28/20 0428  NA 136 135 135 133* 137  K 2.8* 3.2* 4.3 4.4 4.8  CL 98 97* 106 103 107  CO2 27 22 20* 20* 19*  GLUCOSE 137* 264* 91 96 115*  BUN 25* 33* 38* 43* 61*  CREATININE 0.60* 1.12 2.27* 2.41* 3.21*  CALCIUM 8.5* 9.0 7.4* 8.0* 8.2*  MG 1.5* 2.0 2.1 1.8 1.9  PHOS  --   --  6.7*  --  6.7*   GFR: Estimated Creatinine Clearance: 36.1 mL/min (A) (by C-G formula based on SCr of 3.21 mg/dL (H)).  Liver Function Tests: Recent Labs  Lab 02/26/2020 0725 03/24/2020 0646 03/11/2020 2245 03/27/20 0508 03/28/20 0428  AST 14* 16 3,983* 4,809* 892*  ALT 25 24 2,470* 2,826* 1,303*  ALKPHOS 114 103 56 51 58  BILITOT 0.7 0.9 0.9 1.4* 1.5*  PROT 6.0* 6.0* 3.8* 4.4* 4.8*  ALBUMIN 2.9* 2.9* 2.0* 2.8* 3.1*   CBG: Recent Labs  Lab 03/27/20 1630 03/27/20 2026 03/28/20 0010 03/28/20 0442 03/28/20 0753  GLUCAP 104* 102* 121* 109* 106*    Recent Results (from the past 240 hour(s))  Respiratory Panel by RT PCR (Flu A&B, Covid) - Nasopharyngeal Swab     Status: None   Collection Time: 03/21/2020  7:56 AM   Specimen:  Nasopharyngeal Swab  Result Value Ref Range Status   SARS Coronavirus 2 by RT PCR NEGATIVE NEGATIVE Final    Comment: (NOTE) SARS-CoV-2 target nucleic acids are NOT DETECTED.  The SARS-CoV-2 RNA is generally detectable in upper respiratoy specimens during the acute phase of infection. The lowest concentration of SARS-CoV-2 viral copies this assay can detect is 131 copies/mL. A negative result does not preclude SARS-Cov-2 infection and should not be used as the sole basis for treatment or other patient management decisions. A negative result may occur with  improper specimen collection/handling, submission of specimen other than nasopharyngeal swab, presence of viral mutation(s) within the areas targeted by this assay, and inadequate number of viral copies (<131 copies/mL). A negative result must be combined with clinical observations, patient history, and epidemiological information. The expected result is Negative.  Fact Sheet for Patients:  PinkCheek.be  Fact Sheet for Healthcare Providers:  GravelBags.it  This test is no t yet approved or cleared by the [place] FDA and  has been authorized for detection and/or diagnosis of SARS-CoV-2 by FDA under an Emergency Use Authorization (EUA). This EUA will remain  in effect (meaning this test can be used) for the duration of the COVID-19 declaration under Section 564(b)(1) of the Act, 21 U.S.C. section 360bbb-3(b)(1), unless the authorization is terminated or revoked sooner.     Influenza A by PCR NEGATIVE NEGATIVE Final   Influenza B by PCR NEGATIVE NEGATIVE Final    Comment: (NOTE) The Xpert Xpress SARS-CoV-2/FLU/RSV assay is intended as an aid in  the diagnosis of influenza from Nasopharyngeal swab specimens and  should not be used as a sole basis for treatment. Nasal washings and  aspirates are unacceptable for Xpert Xpress SARS-CoV-2/FLU/RSV  testing.  Fact Sheet  for Patients: PinkCheek.be  Fact Sheet for Healthcare Providers: GravelBags.it  This test is not yet approved or cleared by the [place] FDA and  has been authorized for detection and/or diagnosis of SARS-CoV-2 by  FDA under an Emergency Use Authorization (EUA). This EUA will remain  in effect (meaning this test  can be used) for the duration of the  Covid-19 declaration under Section 564(b)(1) of the Act, 21  U.S.C. section 360bbb-3(b)(1), unless the authorization is  terminated or revoked. Performed at [facility], [address] [place], [place] [address]   MRSA PCR Screening     Status: None   Collection Time: 02/27/2020 10:33 AM   Specimen: Nasal Mucosa; Nasopharyngeal  Result Value Ref Range Status   MRSA by PCR NEGATIVE NEGATIVE Final    Comment:        The GeneXpert MRSA Assay (FDA approved for NASAL specimens only), is one component of a comprehensive MRSA colonization surveillance program. It is not intended to diagnose MRSA infection nor to guide or monitor treatment for MRSA infections. Performed at [facility], [address], [place], [place] [address]   Aerobic/Anaerobic Culture (surgical/deep wound)     Status: None (Preliminary result)   Collection Time: 03/14/2020  7:30 PM   Specimen: Abdomen; Wound  Result Value Ref Range Status   Specimen Description   Final    ABDOMEN Performed at [facility], [address], [place], [place] [address]    Special Requests   Final    NONE Performed at [facility], [address], [place], [place] [address]    Gram Stain   Final    RARE WBC PRESENT,BOTH PMN AND MONONUCLEAR FEW YEAST    Culture   Final    CULTURE REINCUBATED FOR BETTER GROWTH Performed at [facility], [place] [address], [place], [place] [address]    Report Status PENDING  Incomplete    Radiology Studies: CT ABDOMEN PELVIS WO CONTRAST  Result Date: 03/10/2020 CLINICAL DATA:   Abdominal distension. Concern for obstruction. Left lower quadrant tenderness and guarding. EXAM: CT ABDOMEN AND PELVIS WITHOUT CONTRAST TECHNIQUE: Multidetector CT imaging of the abdomen and pelvis was performed following the standard protocol without IV contrast. COMPARISON:  June 06, 2018 FINDINGS: Lower chest: There is atelectasis at versus consolidation at the lung bases, not well evaluated secondary to extensive respiratory motion artifact.The heart size is normal. Hepatobiliary: The liver is normal. The gallbladder is mildly distended. There is mild gallbladder wall thickening.There is no biliary ductal dilation. Pancreas: Normal contours without ductal dilatation. No peripancreatic fluid collection. Spleen: Unremarkable. Adrenals/Urinary Tract: --Adrenal glands: Unremarkable. --Right kidney/ureter: No hydronephrosis or radiopaque kidney stones. --Left kidney/ureter: There are punctate nonobstructing stones in the left kidney. --Urinary bladder: Unremarkable. Stomach/Bowel: --Stomach/Duodenum: There is a questionable defect in the wall of the greater curvature of the stomach, not well evaluated secondary to respiratory motion artifact (axial series 2, image 26). --Small bowel: Unremarkable. --Colon: There are scattered colonic diverticula without CT evidence for diverticulitis. There is questionable diffuse circumferential wall thickening throughout the transverse colon. Alternatively, this may be secondary to underdistention. --Appendix: Not visualized. No right lower quadrant inflammation or free fluid. Vascular/Lymphatic: Atherosclerotic calcification is present within the non-aneurysmal abdominal aorta, without hemodynamically significant stenosis. --No retroperitoneal lymphadenopathy. --No mesenteric lymphadenopathy. --No pelvic or inguinal lymphadenopathy. Reproductive: Unremarkable Other: There is a small to moderate volume of ascites. There is a small volume free air in the upper abdomen. There are  bilateral fat containing inguinal hernias. Musculoskeletal. No acute displaced fractures. IMPRESSION: 1. Examination is significantly limited by respiratory motion artifact and lack of IV contrast. 2. Small volume free air in the upper abdomen, consistent with a perforated viscus in the absence of recent surgery. The exact site of perforation is not clearly identified on this exam, however there is  a questionable defect in the wall of the greater curvature of the stomach, not well evaluated secondary to respiratory motion artifact. 3. Questionable diffuse circumferential wall thickening throughout the transverse colon. Alternatively, this may be secondary to underdistention. Correlation for signs and symptoms of colitis is recommended. 4. Nonobstructive left nephrolithiasis. 5. Atelectasis at versus consolidation at the lung bases, not well evaluated secondary to extensive respiratory motion artifact. 6. Mild gallbladder wall thickening, likely reactive. If there is high clinical suspicion for a right upper quadrant process, follow-up with ultrasound is recommended. Aortic Atherosclerosis (ICD10-I70.0). These results were called by telephone at the time of interpretation on 03/22/2020 at 3:39 pm to provider [facility] , who verbally acknowledged these results. Electronically Signed   By: Constance Holster M.D.   On: 03/01/2020 15:39   DG Chest Port 1 View  Result Date: 03/28/2020 CLINICAL DATA:  Acute respiratory failure. Shortness of breath. Productive cough. EXAM: PORTABLE CHEST 1 VIEW COMPARISON:  03/05/2020. FINDINGS: Endotracheal tube and right IJ line in stable position. Cardiomegaly. Low lung volumes with progressive bibasilar atelectasis and infiltrates/edema. Small left pleural effusion. Biapical pleural thickening again noted consistent scarring. No pneumothorax. IMPRESSION: 1. Lines and tubes stable position. 2. Cardiomegaly. 3. Low lung volumes with progressive bibasilar atelectasis and  infiltrates/edema. Electronically Signed   By: Marcello Moores  Register   On: 03/28/2020 05:40   DG CHEST PORT 1 VIEW  Result Date: 03/02/2020 CLINICAL DATA:  ET tube repositioning EXAM: PORTABLE CHEST 1 VIEW COMPARISON:  03/16/2020 FINDINGS: Endotracheal tube is 4.5 cm above the carina. Right central line has been placed with the tip in the SVC. No pneumothorax. Heart is normal size. Increasing bibasilar opacities, left greater than right. No effusions. IMPRESSION: Endotracheal tube is 4.5 cm above the carina. Increasing bibasilar atelectasis or infiltrates, left greater than right. Electronically Signed   By: Rolm Baptise M.D.   On: 03/20/2020 23:10   DG Chest Port 1V same Day  Result Date: 03/28/2020 CLINICAL DATA:  Encounter for orogastric tube placement. EXAM: PORTABLE CHEST 1 VIEW COMPARISON:  Chest radiograph performed earlier the same day 03/28/2020. FINDINGS: Problem-oriented examination for enteric tube placement. The upper chest and lung apices are excluded from the field of view. Interval placement of an enteric tube which passes below level left hemidiaphragm with tip projecting in the region of the stomach. Otherwise, no significant change in the visualized thorax as compared to the chest radiograph performed earlier today. IMPRESSION: Interval placement of an enteric tube with tip projecting in the expected location of the stomach. Electronically Signed   By: Kellie Simmering DO   On: 03/28/2020 11:33   ECHOCARDIOGRAM COMPLETE  Result Date: 03/27/2020    ECHOCARDIOGRAM REPORT   Patient Name:   PAX REASONER Date of Exam: 03/27/2020 Medical Rec #:  [id number]      Height:       72.0 in Accession #:    [id number]     Weight:       244.0 lb Date of Birth:  Oct 18, 1970      BSA:          2.319 m Patient Age:    7 years       BP:           134/83 mmHg Patient Gender: M              HR:           107 bpm. Exam Location:  [facility] Procedure: 2D Echo, Cardiac Doppler  and Color Doppler Indications:    I50.9*  Heart failure (unspecified)  History:        Patient has no prior history of Echocardiogram examinations.                 Abnormal ECG, COPD; Risk Factors:Dyslipidemia. Septic shock.  Sonographer:    Roseanna Rainbow RDCS (AE) Referring Phys: [id number] Murlean Iba  Sonographer Comments: Technically difficult study due to poor echo windows, patient is morbidly obese and echo performed with patient supine and on artificial respirator. Image acquisition challenging due to patient body habitus. IMPRESSIONS  1. Left ventricular ejection fraction, by estimation, is 65 to 70%. The left ventricle has normal function. The left ventricle has no regional wall motion abnormalities. There is mild left ventricular hypertrophy. Left ventricular diastolic parameters were normal.  2. Right ventricular systolic function is normal. The right ventricular size is normal.  3. The mitral valve is normal in structure. No evidence of mitral valve regurgitation. No evidence of mitral stenosis.  4. The aortic valve is tricuspid. Aortic valve regurgitation is not visualized. No aortic stenosis is present.  5. Aortic dilatation noted. There is mild dilatation of the aortic root, measuring 41 mm. FINDINGS  Left Ventricle: Left ventricular ejection fraction, by estimation, is 65 to 70%. The left ventricle has normal function. The left ventricle has no regional wall motion abnormalities. The left ventricular internal cavity size was normal in size. There is  mild left ventricular hypertrophy. Left ventricular diastolic parameters were normal. Right Ventricle: The right ventricular size is normal. No increase in right ventricular wall thickness. Right ventricular systolic function is normal. Left Atrium: Left atrial size was normal in size. Right Atrium: Right atrial size was normal in size. Pericardium: There is no evidence of pericardial effusion. Mitral Valve: The mitral valve is normal in structure. No evidence of mitral valve regurgitation. No  evidence of mitral valve stenosis. Tricuspid Valve: The tricuspid valve is normal in structure. Tricuspid valve regurgitation is not demonstrated. No evidence of tricuspid stenosis. Aortic Valve: The aortic valve is tricuspid. Aortic valve regurgitation is not visualized. No aortic stenosis is present. Aortic valve mean gradient measures 4.9 mmHg. Aortic valve peak gradient measures 8.5 mmHg. Aortic valve area, by VTI measures 4.22 cm. Pulmonic Valve: The pulmonic valve was not well visualized. Pulmonic valve regurgitation is not visualized. No evidence of pulmonic stenosis. Aorta: The aortic root is normal in size and structure and aortic dilatation noted. There is mild dilatation of the aortic root, measuring 41 mm. Venous: IVC assessment for right atrial pressure unable to be performed due to mechanical ventilation. IAS/Shunts: No atrial level shunt detected by color flow Doppler.  LEFT VENTRICLE PLAX 2D LVIDd:         4.50 cm      Diastology LVIDs:         3.15 cm      LV e' medial:   9.79 cm/s LV PW:         1.20 cm      LV E/e' medial: 9.2 LV IVS:        1.24 cm LVOT diam:     2.60 cm LV SV:         106 LV SV Index:   46 LVOT Area:     5.31 cm  LV Volumes (MOD) LV vol d, MOD A2C: 144.0 ml LV vol d, MOD A4C: 123.0 ml LV vol s, MOD A2C: 63.9 ml LV vol s, MOD A4C: 54.5  ml LV SV MOD A2C:     80.1 ml LV SV MOD A4C:     123.0 ml LV SV MOD BP:      74.4 ml RIGHT VENTRICLE RV S prime:     18.80 cm/s TAPSE (M-mode): 2.2 cm LEFT ATRIUM           Index       RIGHT ATRIUM           Index LA diam:      4.00 cm 1.73 cm/m  RA Area:     20.30 cm LA Vol (A2C): 34.3 ml 14.79 ml/m RA Volume:   63.80 ml  27.52 ml/m LA Vol (A4C): 61.0 ml 26.31 ml/m  AORTIC VALVE AV Area (Vmax):    4.67 cm AV Area (Vmean):   4.50 cm AV Area (VTI):     4.22 cm AV Vmax:           145.51 cm/s AV Vmean:          103.499 cm/s AV VTI:            0.251 m AV Peak Grad:      8.5 mmHg AV Mean Grad:      4.9 mmHg LVOT Vmax:         128.00 cm/s LVOT  Vmean:        87.800 cm/s LVOT VTI:          0.200 m LVOT/AV VTI ratio: 0.80  AORTA Ao Root diam: 3.80 cm Ao Asc diam:  3.80 cm MITRAL VALVE MV Area (PHT): 8.72 cm     SHUNTS MV Decel Time: 87 msec      Systemic VTI:  0.20 m MV E velocity: 90.30 cm/s   Systemic Diam: 2.60 cm MV A velocity: 104.00 cm/s MV E/A ratio:  0.87 Carlyle Dolly MD Electronically signed by Carlyle Dolly MD Signature Date/Time: 03/27/2020/3:39:01 PM    Final    Scheduled Meds: . sodium chloride   Intravenous Once  . buPROPion  150 mg Oral Daily  . chlorhexidine gluconate (MEDLINE KIT)  15 mL Mouth Rinse BID  . Chlorhexidine Gluconate Cloth  6 each Topical Daily  . docusate  100 mg Per Tube BID  . fluticasone  2 spray Each Nare Daily  . furosemide  30 mg Intravenous Daily  . hydrocortisone sodium succinate  100 mg Intravenous Q12H  . insulin aspart  0-9 Units Subcutaneous Q4H  . levalbuterol  0.63 mg Nebulization Q6H  . mouth rinse  15 mL Mouth Rinse 10 times per day  . pantoprazole (PROTONIX) IV  40 mg Intravenous Q24H  . polyethylene glycol  17 g Per Tube Daily  . venlafaxine XR  75 mg Oral Daily   Continuous Infusions: . sodium chloride Stopped (03/27/20 1720)  . ceFEPime (MAXIPIME) IV 200 mL/hr at 03/28/20 0531  . fentaNYL infusion INTRAVENOUS 225 mcg/hr (03/28/20 0531)  . metronidazole 500 mg (03/28/20 0531)  . norepinephrine (LEVOPHED) Adult infusion Stopped (03/27/20 2257)    LOS: 3 days   Critical Care Procedure Note Authorized and Performed by: Murvin Natal MD  Total Critical Care time:  38 minutes  Due to a high probability of clinically significant, life threatening deterioration, the patient required my highest level of preparedness to intervene emergently and I personally spent this critical care time directly and personally managing the patient.  This critical care time included obtaining a history; examining the patient, pulse oximetry; ordering and review of studies; arranging urgent treatment with  development of a management plan; evaluation of patient's response of treatment; frequent reassessment; and discussions with other providers.  This critical care time was performed to assess and manage the high probability of imminent and life threatening deterioration that could result in multi-organ failure.  It was exclusive of separately billable procedures and treating other patients and teaching time.   Irwin Brakeman, MD How to contact the [facility] Attending or Consulting provider [place] or covering provider during after hours [place], for this patient?  1. Check the care team in [facility] and look for a) attending/consulting [facility] provider listed and b) the [facility] team listed 2. Log into [email or web address] and use [place]'s universal password to access. If you do not have the password, please contact the hospital operator. 3. Locate the [facility] provider you are looking for under [facility] and page to a number that you can be directly reached. 4. If you still have difficulty reaching the provider, please page the [facility] (Director on Call) for the Hospitalists listed on amion for assistance.  03/28/2020, 11:59 AM

## 2020-03-28 NOTE — Progress Notes (Signed)
TRH NIGHT SHIFT.  The patient's hemoglobin was 6.2 g/dL.  A 2 unit PRBC transfusion was ordered.  The blood bank was asked to keep 2 units ahead.  Continue H&H monitoring.  Sanda Klein, MD

## 2020-03-28 NOTE — Telephone Encounter (Signed)
Noted  

## 2020-03-29 DIAGNOSIS — R6521 Severe sepsis with septic shock: Secondary | ICD-10-CM | POA: Diagnosis not present

## 2020-03-29 DIAGNOSIS — A419 Sepsis, unspecified organism: Secondary | ICD-10-CM | POA: Diagnosis not present

## 2020-03-29 DIAGNOSIS — J9621 Acute and chronic respiratory failure with hypoxia: Secondary | ICD-10-CM | POA: Diagnosis not present

## 2020-03-29 DIAGNOSIS — J441 Chronic obstructive pulmonary disease with (acute) exacerbation: Secondary | ICD-10-CM | POA: Diagnosis not present

## 2020-03-29 DIAGNOSIS — L8990 Pressure ulcer of unspecified site, unspecified stage: Secondary | ICD-10-CM | POA: Insufficient documentation

## 2020-03-29 LAB — CBC WITH DIFFERENTIAL/PLATELET
Band Neutrophils: 8 %
Basophils Absolute: 0 10*3/uL (ref 0.0–0.1)
Basophils Relative: 0 %
Eosinophils Absolute: 0 10*3/uL (ref 0.0–0.5)
Eosinophils Relative: 0 %
HCT: 24.6 % — ABNORMAL LOW (ref 39.0–52.0)
Hemoglobin: 8 g/dL — ABNORMAL LOW (ref 13.0–17.0)
Lymphocytes Relative: 6 %
Lymphs Abs: 0.5 10*3/uL — ABNORMAL LOW (ref 0.7–4.0)
MCH: 30.7 pg (ref 26.0–34.0)
MCHC: 32.5 g/dL (ref 30.0–36.0)
MCV: 94.3 fL (ref 80.0–100.0)
Metamyelocytes Relative: 1 %
Monocytes Absolute: 0.3 10*3/uL (ref 0.1–1.0)
Monocytes Relative: 3 %
Neutro Abs: 7.9 10*3/uL — ABNORMAL HIGH (ref 1.7–7.7)
Neutrophils Relative %: 82 %
Platelets: 40 10*3/uL — ABNORMAL LOW (ref 150–400)
RBC: 2.61 MIL/uL — ABNORMAL LOW (ref 4.22–5.81)
RDW: 15.5 % (ref 11.5–15.5)
WBC: 8.8 10*3/uL (ref 4.0–10.5)
nRBC: 0.3 % — ABNORMAL HIGH (ref 0.0–0.2)

## 2020-03-29 LAB — TYPE AND SCREEN
ABO/RH(D): O POS
Antibody Screen: NEGATIVE
Unit division: 0
Unit division: 0

## 2020-03-29 LAB — COMPREHENSIVE METABOLIC PANEL
ALT: 886 U/L — ABNORMAL HIGH (ref 0–44)
AST: 240 U/L — ABNORMAL HIGH (ref 15–41)
Albumin: 2.7 g/dL — ABNORMAL LOW (ref 3.5–5.0)
Alkaline Phosphatase: 89 U/L (ref 38–126)
Anion gap: 15 (ref 5–15)
BUN: 66 mg/dL — ABNORMAL HIGH (ref 6–20)
CO2: 19 mmol/L — ABNORMAL LOW (ref 22–32)
Calcium: 8.4 mg/dL — ABNORMAL LOW (ref 8.9–10.3)
Chloride: 106 mmol/L (ref 98–111)
Creatinine, Ser: 3.09 mg/dL — ABNORMAL HIGH (ref 0.61–1.24)
GFR, Estimated: 24 mL/min — ABNORMAL LOW (ref >60)
Glucose, Bld: 101 mg/dL — ABNORMAL HIGH (ref 70–99)
Potassium: 3.3 mmol/L — ABNORMAL LOW (ref 3.5–5.1)
Sodium: 140 mmol/L (ref 135–145)
Total Bilirubin: 1.2 mg/dL (ref 0.3–1.2)
Total Protein: 4.7 g/dL — ABNORMAL LOW (ref 6.5–8.1)

## 2020-03-29 LAB — GLUCOSE, CAPILLARY
Glucose-Capillary: 101 mg/dL — ABNORMAL HIGH (ref 70–99)
Glucose-Capillary: 104 mg/dL — ABNORMAL HIGH (ref 70–99)
Glucose-Capillary: 105 mg/dL — ABNORMAL HIGH (ref 70–99)
Glucose-Capillary: 106 mg/dL — ABNORMAL HIGH (ref 70–99)
Glucose-Capillary: 108 mg/dL — ABNORMAL HIGH (ref 70–99)
Glucose-Capillary: 110 mg/dL — ABNORMAL HIGH (ref 70–99)

## 2020-03-29 LAB — BPAM RBC
Blood Product Expiration Date: 202112042359
Blood Product Expiration Date: 202112062359
ISSUE DATE / TIME: 202111020453
ISSUE DATE / TIME: 202111020828
Unit Type and Rh: 5100
Unit Type and Rh: 5100

## 2020-03-29 LAB — TRIGLYCERIDES: Triglycerides: 98 mg/dL (ref <150)

## 2020-03-29 LAB — PROTIME-INR
INR: 2.1 — ABNORMAL HIGH (ref 0.8–1.2)
Prothrombin Time: 22.4 seconds — ABNORMAL HIGH (ref 11.4–15.2)

## 2020-03-29 LAB — MAGNESIUM: Magnesium: 2 mg/dL (ref 1.7–2.4)

## 2020-03-29 MED ORDER — VITAL AF 1.2 CAL PO LIQD
1000.0000 mL | ORAL | Status: DC
  Administered 2020-03-29: 1000 mL

## 2020-03-29 MED ORDER — SILVER NITRATE-POT NITRATE 75-25 % EX MISC
1.0000 "application " | Freq: Once | CUTANEOUS | Status: AC
  Administered 2020-03-29: 1 via TOPICAL
  Filled 2020-03-29: qty 10

## 2020-03-29 MED ORDER — HYDROCORTISONE NA SUCCINATE PF 100 MG IJ SOLR
25.0000 mg | Freq: Two times a day (BID) | INTRAMUSCULAR | Status: DC
  Administered 2020-03-29 – 2020-03-31 (×4): 25 mg via INTRAVENOUS
  Filled 2020-03-29 (×4): qty 2

## 2020-03-29 MED ORDER — FUROSEMIDE 10 MG/ML IJ SOLN
30.0000 mg | Freq: Once | INTRAMUSCULAR | Status: AC
  Administered 2020-03-29: 30 mg via INTRAVENOUS
  Filled 2020-03-29: qty 4

## 2020-03-29 MED ORDER — POTASSIUM CHLORIDE 10 MEQ/100ML IV SOLN
10.0000 meq | INTRAVENOUS | Status: AC
  Administered 2020-03-29 (×2): 10 meq via INTRAVENOUS
  Filled 2020-03-29 (×2): qty 100

## 2020-03-29 MED ORDER — VITAL HIGH PROTEIN PO LIQD: 1000.0000 mL | ORAL | Status: DC

## 2020-03-29 MED ORDER — PROSOURCE TF PO LIQD
90.0000 mL | Freq: Three times a day (TID) | ORAL | Status: DC
  Administered 2020-03-29 – 2020-03-30 (×5): 90 mL
  Filled 2020-03-29 (×5): qty 90

## 2020-03-29 NOTE — Progress Notes (Signed)
PROGRESS NOTE   Manuel Richard  ALP:[id number] DOB: 1971/03/22 DOA: 02/25/2020 PCP: Martin Majestic, FNP   Chief Complaint  Patient presents with  . Shortness of Breath   Brief Admission History:  49 y.o. male with medical history significant very severe COPD, longtime smoker with nicotine dependence, chronic pain, diastolic CHF, GERD, hyperlipidemia, supplemental oxygen dependent (2-4L/min), chronic dyspnea who had been followed by pulmonary clinic until he was dismissed from the practice due to belligerent behaviors.  He has been having a difficult time with chronic shortness of breath with acute exacerbations over the past month with multiple emergency department visits.  He was most recently seen in the emergency department on 1016 where he was treated for an acute exacerbation of COPD and sent home.  He returned to the emergency department today with symptoms of progressive shortness of breath difficulty speaking and poor exercise tolerance.  He has been having cough and shortness of breath with yellowish sputum production.  He also reports chronic leg edema which is mostly unchanged.  He says that his shortness of breath woke him up in the middle of the night.  He is not lying flat recumbent.  His main complaint is the shortness of breath.  He is breathing very fast.  He is adamant that he stopped smoking 1 month ago.  He has a 31+ year history of smoking up to 2 packs a day.  On 10/31 pt developed increasing respiratory distress on the med surg floor.  He was started on bipap therapy and transferred to stepdown ICU on bipap.  Wife confirmed that patient is full code.   Assessment & Plan:   Principal Problem:   COPD with acute exacerbation ([place]) Active Problems:   Drug-seeking behavior   Back pain at L4-L5 level   Dysphagia   Chronic pain syndrome   Dyslipidemia   Vitamin D deficiency   Chronic lower extremity pain (Secondary Area of Pain) (Left)   Long term current use of opiate  analgesic   GERD (gastroesophageal reflux disease)   Acute and chronic respiratory failure with hypoxia (HCC)   Leukocytosis   Sinus tachycardia   Tachypnea   Perforation of viscus   Severe sepsis with septic shock (HCC)   Pressure injury of skin   1)Severe Sepsis on admission with Septic Shock following admission secondary to abdominal infection/feculent peritonitis -  -Continue IV cefepime and metronidazole. -c/n Eraxis (antifungal) -Patient is off Levophed drip -Taper of hydrocortisone --Intra-abdominal wound cultures from 02/27/2020 with yeast otherwise negative for bacteria at this time  -WBC trending down -Hemodynamically improving with improvement in sepsis pathophysiology -Echo with preserved EF of 65 to 70% without regional wall motion normalities or significant diastolic dysfunction  2)Perforated Sigmoid Colon with feculent peritonitis --status post exploratory Laparotomy, Sigmoid Colectomy with End Colostomy.  --03/22/2020 --on 03/25/2020 Pt underwent exploratory lap with lysis of adhesions, sigmoidectomy with left colostomy placement -by Dr. Ronny Bacon --Per general surgeon Dr. Arnoldo Morale okay to start tube feeding on 03/29/2020  3)AKI - likely ATN in setting of severe sepsis and hypotension.   -Nephrology consult appreciated, recommending discontinuation of fluids and IV Lasix -Creatinine is 3.0 (peak was 3.21, baseline usually around 1) -Anticipating resolution without need for RRT - renally adjust medications, avoid nephrotoxic agents / dehydration  / hypotension  4)Acute respiratory failure with hypoxia-secondary to COPD with acute exacerbation and subsequent sepsis and septic shock from intra-abdominal infection - pt remains intubated and sedated.    5)Elevated LFTs - Possibly from  Shock liver/Hypotension AST down to 240 from a peak of 4809 ALT down to 886 from peak of 2,826 Alk Phos -WNL T Bil down to 1.2 from a peak of 1.5  6)Acute blood loss  anemia/thrombocytopenia-postoperative blood loss, -Hgb is stable around 8 after transfusion of 2 units of PRBC -INR around 2 down from 3.2 -Platelets down to 40 K-- ??  Sepsis related and compounded by thrombocytopenia of consumption due to perioperative bleeding -Patient is not on heparin or Lovenox at this time, continue to watch for possible DIC   7)Steroid-induced Hyperglycemia--Use Novolog/Humalog Sliding scale insulin with Accu-Cheks/Fingersticks as ordered   8)Chronic constipation-He said he had been taking Linzess prior to admission.    9)Hypokalemia / Hypomagnesemia - replacements given and following.   CRITICAL CARE Performed by: Roxan Hockey   Total critical care time: 48 minutes  Critical care time was exclusive of separately billable procedures and treating other patients.  Critical care was necessary to treat or prevent imminent or life-threatening deterioration. -Vent Settings: -PRVC/40/5/20/620  Critical care was time spent personally by me on the following activities: development of treatment plan with patient and/or surrogate as well as nursing, discussions with consultants, evaluation of patient's response to treatment, examination of patient, obtaining history from patient or surrogate, ordering and performing treatments and interventions, ordering and review of laboratory studies, ordering and review of radiographic studies, pulse oximetry and re-evaluation of patient's condition.   DVT prophylaxis: SCDs (low platelets) GI flushes with IV Protonix Code Status: Full Family Communication: wife updated by telephone, she confirmed full code status, asked for [place] to update her only  Disposition Plan: Home when medically stabilized Consults called: PCCM, Gen Surgery, Nephrology Admission status: INP  Status is: Inpatient  Remains inpatient appropriate because:Hemodynamically unstable, IV treatments appropriate due to intensity of illness or inability to take PO  and Inpatient level of care appropriate due to severity of illness  Dispo: The patient is from: Home              Anticipated d/c is to: Home              Anticipated d/c date is: > 3 days              Patient currently is not medically stable to d/c.  Consultants:   PCCM   Procedures:  Intubated 03/15/2020 -Rt IJ  -status post exploratory Laparotomy, Sigmoid Colectomy with End Colostomy.  --03/15/2020   Antimicrobials:  Doxycycline 10/30>>  Cefepime Erasix  Subjective: -Remains intubated and sedated -Urine output improving Afebrile  Objective: Vitals:   03/29/20 1000 03/29/20 1015 03/29/20 1030 03/29/20 1045  BP: 113/60 (!) 112/58 (!) 106/58 (!) 111/59  Pulse: 94 93 91 92  Resp: $Remo'20 20 20 20  'TZmad$ Temp:      TempSrc:      SpO2: 95% 94% 94% 94%  Weight:      Height:        Intake/Output Summary (Last 24 hours) at 03/29/2020 1318 Last data filed at 03/29/2020 1000 Gross per 24 hour  Intake 3012.93 ml  Output 3505 ml  Net -492.07 ml   Filed Weights   03/05/2020 0300 03/07/2020 1000 03/27/20 0600  Weight: 102.3 kg 103.2 kg 110.7 kg   Examination:  General exam: sedated and intubated in ICU. Chronically ill appearing  Respiratory system: intubated and mechanically ventilated good air movement bilateral with no wheezes or rhonchi heard.  Cardiovascular system: right sided IJ central line in place.  Normal S1 & S2  heard,   Gastrointestinal system: Abdomen is obese, slightly less distended, ostomy appears viable, minimal BS heard, incisional wounds clean and dry.  Central nervous system: sedated on vent.  Extremities: 1+ pretibial edema BLEs,  onychomycosis.  Skin: chronic venous stasis changes.   Psychiatry: sedated on vent in ICU.  GU- foley in situ   Data Reviewed: I have personally reviewed following labs and imaging studies  CBC: Recent Labs  Lab 03/07/2020 0646 03/12/2020 0646 03/02/2020 2245 02/27/2020 2245 03/27/20 0508 03/28/20 0113 03/28/20 0428 03/28/20 1900  03/29/20 0514  WBC 15.8*   < > 6.0  --  9.9 5.8 6.4  --  8.8  NEUTROABS 13.1*  --  4.9  --  4.9  --  5.8  --  7.9*  HGB 12.9*   < > 9.7*   < > 8.7* 6.2* 6.5* 8.2* 8.0*  HCT 39.2   < > 30.2*   < > 26.6* 19.2* 20.5* 25.2* 24.6*  MCV 93.8   < > 98.4  --  95.7 98.0 99.0  --  94.3  PLT 260   < > 119*  --  106* 58* 58*  --  40*   < > = values in this interval not displayed.    Basic Metabolic Panel: Recent Labs  Lab 03/01/2020 0646 02/29/2020 2245 03/27/20 0508 03/28/20 0428 03/29/20 0514  NA 135 135 133* 137 140  K 3.2* 4.3 4.4 4.8 3.3*  CL 97* 106 103 107 106  CO2 22 20* 20* 19* 19*  GLUCOSE 264* 91 96 115* 101*  BUN 33* 38* 43* 61* 66*  CREATININE 1.12 2.27* 2.41* 3.21* 3.09*  CALCIUM 9.0 7.4* 8.0* 8.2* 8.4*  MG 2.0 2.1 1.8 1.9 2.0  PHOS  --  6.7*  --  6.7*  --    GFR: Estimated Creatinine Clearance: 37.5 mL/min (A) (by C-G formula based on SCr of 3.09 mg/dL (H)).  Liver Function Tests: Recent Labs  Lab 03/18/2020 0646 03/10/2020 2245 03/27/20 0508 03/28/20 0428 03/29/20 0514  AST 16 3,983* 4,809* 892* 240*  ALT 24 2,470* 2,826* 1,303* 886*  ALKPHOS 103 56 51 58 89  BILITOT 0.9 0.9 1.4* 1.5* 1.2  PROT 6.0* 3.8* 4.4* 4.8* 4.7*  ALBUMIN 2.9* 2.0* 2.8* 3.1* 2.7*   CBG: Recent Labs  Lab 03/28/20 2004 03/29/20 0031 03/29/20 0402 03/29/20 0758 03/29/20 1147  GLUCAP 121* 101* 108* 106* 110*    Recent Results (from the past 240 hour(s))  Respiratory Panel by RT PCR (Flu A&B, Covid) - Nasopharyngeal Swab     Status: None   Collection Time: 03/10/2020  7:56 AM   Specimen: Nasopharyngeal Swab  Result Value Ref Range Status   SARS Coronavirus 2 by RT PCR NEGATIVE NEGATIVE Final    Comment: (NOTE) SARS-CoV-2 target nucleic acids are NOT DETECTED.  The SARS-CoV-2 RNA is generally detectable in upper respiratoy specimens during the acute phase of infection. The lowest concentration of SARS-CoV-2 viral copies this assay can detect is 131 copies/mL. A negative result does not  preclude SARS-Cov-2 infection and should not be used as the sole basis for treatment or other patient management decisions. A negative result may occur with  improper specimen collection/handling, submission of specimen other than nasopharyngeal swab, presence of viral mutation(s) within the areas targeted by this assay, and inadequate number of viral copies (<131 copies/mL). A negative result must be combined with clinical observations, patient history, and epidemiological information. The expected result is Negative.  Fact Sheet for Patients:  PinkCheek.be  Fact Sheet for Healthcare Providers:  GravelBags.it  This test is no t yet approved or cleared by the [place] FDA and  has been authorized for detection and/or diagnosis of SARS-CoV-2 by FDA under an Emergency Use Authorization (EUA). This EUA will remain  in effect (meaning this test can be used) for the duration of the COVID-19 declaration under Section 564(b)(1) of the Act, 21 U.S.C. section 360bbb-3(b)(1), unless the authorization is terminated or revoked sooner.     Influenza A by PCR NEGATIVE NEGATIVE Final   Influenza B by PCR NEGATIVE NEGATIVE Final    Comment: (NOTE) The Xpert Xpress SARS-CoV-2/FLU/RSV assay is intended as an aid in  the diagnosis of influenza from Nasopharyngeal swab specimens and  should not be used as a sole basis for treatment. Nasal washings and  aspirates are unacceptable for Xpert Xpress SARS-CoV-2/FLU/RSV  testing.  Fact Sheet for Patients: PinkCheek.be  Fact Sheet for Healthcare Providers: GravelBags.it  This test is not yet approved or cleared by the [place] FDA and  has been authorized for detection and/or diagnosis of SARS-CoV-2 by  FDA under an Emergency Use Authorization (EUA). This EUA will remain  in effect (meaning this test can be used) for the  duration of the  Covid-19 declaration under Section 564(b)(1) of the Act, 21  U.S.C. section 360bbb-3(b)(1), unless the authorization is  terminated or revoked. Performed at [facility], [address], [place], [place] [address]   MRSA PCR Screening     Status: None   Collection Time: 02/29/2020 10:33 AM   Specimen: Nasal Mucosa; Nasopharyngeal  Result Value Ref Range Status   MRSA by PCR NEGATIVE NEGATIVE Final    Comment:        The GeneXpert MRSA Assay (FDA approved for NASAL specimens only), is one component of a comprehensive MRSA colonization surveillance program. It is not intended to diagnose MRSA infection nor to guide or monitor treatment for MRSA infections. Performed at [facility], [address] [place], [place] [address]   Aerobic/Anaerobic Culture (surgical/deep wound)     Status: None (Preliminary result)   Collection Time: 03/06/2020  7:30 PM   Specimen: Abdomen; Wound  Result Value Ref Range Status   Specimen Description   Final    ABDOMEN Performed at [facility] [address], [place], [place] [address]    Special Requests   Final    NONE Performed at [facility], [address], [place], [place] [address]    Gram Stain   Final    RARE WBC PRESENT,BOTH PMN AND MONONUCLEAR FEW YEAST Performed at [facility], [place] [address] [place],  [address]    Culture   Final    FEW CANDIDA GLABRATA FEW CANDIDA ALBICANS NO ANAEROBES ISOLATED; CULTURE IN PROGRESS FOR 5 DAYS    Report Status PENDING  Incomplete    Radiology Studies: DG Chest Port 1 View  Result Date: 03/28/2020 CLINICAL DATA:  Acute respiratory failure. Shortness of breath. Productive cough. EXAM: PORTABLE CHEST 1 VIEW COMPARISON:  03/11/2020. FINDINGS: Endotracheal tube and right IJ line in stable position. Cardiomegaly. Low lung volumes with progressive bibasilar atelectasis and infiltrates/edema. Small left pleural effusion. Biapical pleural thickening again noted consistent  scarring. No pneumothorax. IMPRESSION: 1. Lines and tubes stable position. 2. Cardiomegaly. 3. Low lung volumes with progressive bibasilar atelectasis and infiltrates/edema. Electronically Signed   By: Marcello Moores  Register   On: 03/28/2020 05:40   DG Chest Port 1V same Day  Result Date: 03/28/2020 CLINICAL  DATA:  Encounter for orogastric tube placement. EXAM: PORTABLE CHEST 1 VIEW COMPARISON:  Chest radiograph performed earlier the same day 03/28/2020. FINDINGS: Problem-oriented examination for enteric tube placement. The upper chest and lung apices are excluded from the field of view. Interval placement of an enteric tube which passes below level left hemidiaphragm with tip projecting in the region of the stomach. Otherwise, no significant change in the visualized thorax as compared to the chest radiograph performed earlier today. IMPRESSION: Interval placement of an enteric tube with tip projecting in the expected location of the stomach. Electronically Signed   By: Kellie Simmering DO   On: 03/28/2020 11:33   ECHOCARDIOGRAM COMPLETE  Result Date: 03/27/2020    ECHOCARDIOGRAM REPORT   Patient Name:   Manuel Richard Date of Exam: 03/27/2020 Medical Rec #:  [id number]      Height:       72.0 in Accession #:    [id number]     Weight:       244.0 lb Date of Birth:  1970/06/30      BSA:          2.319 m Patient Age:    17 years       BP:           134/83 mmHg Patient Gender: M              HR:           107 bpm. Exam Location:  [facility] Procedure: 2D Echo, Cardiac Doppler and Color Doppler Indications:    I50.9* Heart failure (unspecified)  History:        Patient has no prior history of Echocardiogram examinations.                 Abnormal ECG, COPD; Risk Factors:Dyslipidemia. Septic shock.  Sonographer:    Roseanna Rainbow RDCS (AE) Referring Phys: [id number] Murlean Iba  Sonographer Comments: Technically difficult study due to poor echo windows, patient is morbidly obese and echo performed with patient supine and on  artificial respirator. Image acquisition challenging due to patient body habitus. IMPRESSIONS  1. Left ventricular ejection fraction, by estimation, is 65 to 70%. The left ventricle has normal function. The left ventricle has no regional wall motion abnormalities. There is mild left ventricular hypertrophy. Left ventricular diastolic parameters were normal.  2. Right ventricular systolic function is normal. The right ventricular size is normal.  3. The mitral valve is normal in structure. No evidence of mitral valve regurgitation. No evidence of mitral stenosis.  4. The aortic valve is tricuspid. Aortic valve regurgitation is not visualized. No aortic stenosis is present.  5. Aortic dilatation noted. There is mild dilatation of the aortic root, measuring 41 mm. FINDINGS  Left Ventricle: Left ventricular ejection fraction, by estimation, is 65 to 70%. The left ventricle has normal function. The left ventricle has no regional wall motion abnormalities. The left ventricular internal cavity size was normal in size. There is  mild left ventricular hypertrophy. Left ventricular diastolic parameters were normal. Right Ventricle: The right ventricular size is normal. No increase in right ventricular wall thickness. Right ventricular systolic function is normal. Left Atrium: Left atrial size was normal in size. Right Atrium: Right atrial size was normal in size. Pericardium: There is no evidence of pericardial effusion. Mitral Valve: The mitral valve is normal in structure. No evidence of mitral valve regurgitation. No evidence of mitral valve stenosis. Tricuspid Valve: The tricuspid valve is normal in structure. Tricuspid  valve regurgitation is not demonstrated. No evidence of tricuspid stenosis. Aortic Valve: The aortic valve is tricuspid. Aortic valve regurgitation is not visualized. No aortic stenosis is present. Aortic valve mean gradient measures 4.9 mmHg. Aortic valve peak gradient measures 8.5 mmHg. Aortic valve  area, by VTI measures 4.22 cm. Pulmonic Valve: The pulmonic valve was not well visualized. Pulmonic valve regurgitation is not visualized. No evidence of pulmonic stenosis. Aorta: The aortic root is normal in size and structure and aortic dilatation noted. There is mild dilatation of the aortic root, measuring 41 mm. Venous: IVC assessment for right atrial pressure unable to be performed due to mechanical ventilation. IAS/Shunts: No atrial level shunt detected by color flow Doppler.  LEFT VENTRICLE PLAX 2D LVIDd:         4.50 cm      Diastology LVIDs:         3.15 cm      LV e' medial:   9.79 cm/s LV PW:         1.20 cm      LV E/e' medial: 9.2 LV IVS:        1.24 cm LVOT diam:     2.60 cm LV SV:         106 LV SV Index:   46 LVOT Area:     5.31 cm  LV Volumes (MOD) LV vol d, MOD A2C: 144.0 ml LV vol d, MOD A4C: 123.0 ml LV vol s, MOD A2C: 63.9 ml LV vol s, MOD A4C: 54.5 ml LV SV MOD A2C:     80.1 ml LV SV MOD A4C:     123.0 ml LV SV MOD BP:      74.4 ml RIGHT VENTRICLE RV S prime:     18.80 cm/s TAPSE (M-mode): 2.2 cm LEFT ATRIUM           Index       RIGHT ATRIUM           Index LA diam:      4.00 cm 1.73 cm/m  RA Area:     20.30 cm LA Vol (A2C): 34.3 ml 14.79 ml/m RA Volume:   63.80 ml  27.52 ml/m LA Vol (A4C): 61.0 ml 26.31 ml/m  AORTIC VALVE AV Area (Vmax):    4.67 cm AV Area (Vmean):   4.50 cm AV Area (VTI):     4.22 cm AV Vmax:           145.51 cm/s AV Vmean:          103.499 cm/s AV VTI:            0.251 m AV Peak Grad:      8.5 mmHg AV Mean Grad:      4.9 mmHg LVOT Vmax:         128.00 cm/s LVOT Vmean:        87.800 cm/s LVOT VTI:          0.200 m LVOT/AV VTI ratio: 0.80  AORTA Ao Root diam: 3.80 cm Ao Asc diam:  3.80 cm MITRAL VALVE MV Area (PHT): 8.72 cm     SHUNTS MV Decel Time: 87 msec      Systemic VTI:  0.20 m MV E velocity: 90.30 cm/s   Systemic Diam: 2.60 cm MV A velocity: 104.00 cm/s MV E/A ratio:  0.87 Carlyle Dolly MD Electronically signed by Carlyle Dolly MD Signature Date/Time:  03/27/2020/3:39:01 PM    Final    Scheduled Meds: . sodium chloride  Intravenous Once  . buPROPion  150 mg Oral Daily  . chlorhexidine gluconate (MEDLINE KIT)  15 mL Mouth Rinse BID  . Chlorhexidine Gluconate Cloth  6 each Topical Daily  . docusate  100 mg Per Tube BID  . feeding supplement (PROSource TF)  90 mL Per Tube TID  . fluticasone  2 spray Each Nare Daily  . hydrocortisone sodium succinate  50 mg Intravenous Q12H  . insulin aspart  0-9 Units Subcutaneous Q4H  . levalbuterol  0.63 mg Nebulization Q6H  . mouth rinse  15 mL Mouth Rinse 10 times per day  . pantoprazole (PROTONIX) IV  40 mg Intravenous Q24H  . polyethylene glycol  17 g Per Tube Daily  . venlafaxine XR  75 mg Oral Daily   Continuous Infusions: . anidulafungin    . ceFEPime (MAXIPIME) IV Stopped (03/29/20 6431)  . feeding supplement (VITAL AF 1.2 CAL)    . fentaNYL infusion INTRAVENOUS 225 mcg/hr (03/29/20 1112)  . metronidazole Stopped (03/29/20 0801)  . norepinephrine (LEVOPHED) Adult infusion Stopped (03/27/20 2257)    LOS: 4 days   Roxan Hockey, MD [facility]   03/29/2020, 1:18 PM

## 2020-03-29 NOTE — Progress Notes (Addendum)
3 Days Post-Op  Subjective: Intubated and sedated.  Objective: Vital signs in last 24 hours: Temp:  [97.7 F (36.5 C)-98.9 F (37.2 C)] 97.7 F (36.5 C) (11/02 2000) Pulse Rate:  [74-113] 94 (11/03 0845) Resp:  [13-30] 20 (11/03 0845) BP: (109-123)/(61-81) 113/61 (11/03 0845) SpO2:  [90 %-99 %] 94 % (11/03 0845) Arterial Line BP: (113-127)/(63-71) 116/68 (11/02 1745) FiO2 (%):  [40 %] 40 % (11/03 0840) Last BM Date: 03/24/20 (immature ostomy formed 10/31)  Intake/Output from previous day: 11/02 0701 - 11/03 0700 In: 2946.8 [I.V.:1362.6; Blood:622; NG/GT:30; IV Piggyback:732.1] Out: 3855 [Urine:3450; Emesis/NG output:400; Stool:5] Intake/Output this shift: Total I/O In: 30 [NG/GT:30] Out: -   GI: Soft, midline incision healing well.  Unmatured colostomy was opened at bedside and any bleeding was controlled using silver nitrate sticks.  There was stool in the colostomy.  Lab Results:  Recent Labs    03/28/20 0428 03/28/20 0428 03/28/20 1900 03/29/20 0514  WBC 6.4  --   --  8.8  HGB 6.5*   < > 8.2* 8.0*  HCT 20.5*   < > 25.2* 24.6*  PLT 58*  --   --  40*   < > = values in this interval not displayed.   BMET Recent Labs    03/28/20 0428 03/29/20 0514  NA 137 140  K 4.8 3.3*  CL 107 106  CO2 19* 19*  GLUCOSE 115* 101*  BUN 61* 66*  CREATININE 3.21* 3.09*  CALCIUM 8.2* 8.4*   PT/INR Recent Labs    03/28/20 0730 03/29/20 0514  LABPROT 22.4* 22.4*  INR 2.0* 2.1*    Studies/Results: DG Chest Port 1 View  Result Date: 03/28/2020 CLINICAL DATA:  Acute respiratory failure. Shortness of breath. Productive cough. EXAM: PORTABLE CHEST 1 VIEW COMPARISON:  03/10/2020. FINDINGS: Endotracheal tube and right IJ line in stable position. Cardiomegaly. Low lung volumes with progressive bibasilar atelectasis and infiltrates/edema. Small left pleural effusion. Biapical pleural thickening again noted consistent scarring. No pneumothorax. IMPRESSION: 1. Lines and tubes stable  position. 2. Cardiomegaly. 3. Low lung volumes with progressive bibasilar atelectasis and infiltrates/edema. Electronically Signed   By: Maisie Fushomas  Register   On: 03/28/2020 05:40   DG Chest Port 1V same Day  Result Date: 03/28/2020 CLINICAL DATA:  Encounter for orogastric tube placement. EXAM: PORTABLE CHEST 1 VIEW COMPARISON:  Chest radiograph performed earlier the same day 03/28/2020. FINDINGS: Problem-oriented examination for enteric tube placement. The upper chest and lung apices are excluded from the field of view. Interval placement of an enteric tube which passes below level left hemidiaphragm with tip projecting in the region of the stomach. Otherwise, no significant change in the visualized thorax as compared to the chest radiograph performed earlier today. IMPRESSION: Interval placement of an enteric tube with tip projecting in the expected location of the stomach. Electronically Signed   By: Jackey LogeKyle  Golden DO   On: 03/28/2020 11:33   ECHOCARDIOGRAM COMPLETE  Result Date: 03/27/2020    ECHOCARDIOGRAM REPORT   Patient Name:   Manuel Richard Date of Exam: 03/27/2020 Medical Rec #:  [id number]      Height:       72.0 in Accession #:    [id number]     Weight:       244.0 lb Date of Birth:  12-25-1970      BSA:          2.319 m Patient Age:    49 years       BP:  134/83 mmHg Patient Gender: M              HR:           107 bpm. [facility] Procedure: 2D Echo, Cardiac Doppler and Color Doppler Indications:    I50.9* Heart failure (unspecified)  History:        Patient has no prior history of Echocardiogram examinations.                 Abnormal ECG, COPD; Risk Factors:Dyslipidemia. Septic shock.  Sonographer:    Sheralyn Boatman RDCS (AE) Referring Phys: [id number] Cleora Fleet  Sonographer Comments: Technically difficult study due to poor echo windows, patient is morbidly obese and echo performed with patient supine and on artificial respirator. Image acquisition challenging due to patient  body habitus. IMPRESSIONS  1. Left ventricular ejection fraction, by estimation, is 65 to 70%. The left ventricle has normal function. The left ventricle has no regional wall motion abnormalities. There is mild left ventricular hypertrophy. Left ventricular diastolic parameters were normal.  2. Right ventricular systolic function is normal. The right ventricular size is normal.  3. The mitral valve is normal in structure. No evidence of mitral valve regurgitation. No evidence of mitral stenosis.  4. The aortic valve is tricuspid. Aortic valve regurgitation is not visualized. No aortic stenosis is present.  5. Aortic dilatation noted. There is mild dilatation of the aortic root, measuring 41 mm. FINDINGS  Left Ventricle: Left ventricular ejection fraction, by estimation, is 65 to 70%. The left ventricle has normal function. The left ventricle has no regional wall motion abnormalities. The left ventricular internal cavity size was normal in size. There is  mild left ventricular hypertrophy. Left ventricular diastolic parameters were normal. Right Ventricle: The right ventricular size is normal. No increase in right ventricular wall thickness. Right ventricular systolic function is normal. Left Atrium: Left atrial size was normal in size. Right Atrium: Right atrial size was normal in size. Pericardium: There is no evidence of pericardial effusion. Mitral Valve: The mitral valve is normal in structure. No evidence of mitral valve regurgitation. No evidence of mitral valve stenosis. Tricuspid Valve: The tricuspid valve is normal in structure. Tricuspid valve regurgitation is not demonstrated. No evidence of tricuspid stenosis. Aortic Valve: The aortic valve is tricuspid. Aortic valve regurgitation is not visualized. No aortic stenosis is present. Aortic valve mean gradient measures 4.9 mmHg. Aortic valve peak gradient measures 8.5 mmHg. Aortic valve area, by VTI measures 4.22 cm. Pulmonic Valve: The pulmonic valve was  not well visualized. Pulmonic valve regurgitation is not visualized. No evidence of pulmonic stenosis. Aorta: The aortic root is normal in size and structure and aortic dilatation noted. There is mild dilatation of the aortic root, measuring 41 mm. Venous: IVC assessment for right atrial pressure unable to be performed due to mechanical ventilation. IAS/Shunts: No atrial level shunt detected by color flow Doppler.  LEFT VENTRICLE PLAX 2D LVIDd:         4.50 cm      Diastology LVIDs:         3.15 cm      LV e' medial:   9.79 cm/s LV PW:         1.20 cm      LV E/e' medial: 9.2 LV IVS:        1.24 cm LVOT diam:     2.60 cm LV SV:         106 LV SV Index:  46 LVOT Area:     5.31 cm  LV Volumes (MOD) LV vol d, MOD A2C: 144.0 ml LV vol d, MOD A4C: 123.0 ml LV vol s, MOD A2C: 63.9 ml LV vol s, MOD A4C: 54.5 ml LV SV MOD A2C:     80.1 ml LV SV MOD A4C:     123.0 ml LV SV MOD BP:      74.4 ml RIGHT VENTRICLE RV S prime:     18.80 cm/s TAPSE (M-mode): 2.2 cm LEFT ATRIUM           Index       RIGHT ATRIUM           Index LA diam:      4.00 cm 1.73 cm/m  RA Area:     20.30 cm LA Vol (A2C): 34.3 ml 14.79 ml/m RA Volume:   63.80 ml  27.52 ml/m LA Vol (A4C): 61.0 ml 26.31 ml/m  AORTIC VALVE AV Area (Vmax):    4.67 cm AV Area (Vmean):   4.50 cm AV Area (VTI):     4.22 cm AV Vmax:           145.51 cm/s AV Vmean:          103.499 cm/s AV VTI:            0.251 m AV Peak Grad:      8.5 mmHg AV Mean Grad:      4.9 mmHg LVOT Vmax:         128.00 cm/s LVOT Vmean:        87.800 cm/s LVOT VTI:          0.200 m LVOT/AV VTI ratio: 0.80  AORTA Ao Root diam: 3.80 cm Ao Asc diam:  3.80 cm MITRAL VALVE MV Area (PHT): 8.72 cm     SHUNTS MV Decel Time: 87 msec      Systemic VTI:  0.20 m MV E velocity: 90.30 cm/s   Systemic Diam: 2.60 cm MV A velocity: 104.00 cm/s MV E/A ratio:  0.87 Dina Rich MD Electronically signed by Dina Rich MD Signature Date/Time: 03/27/2020/3:39:01 PM    Final     Anti-infectives: Anti-infectives  (From admission, onward)   Start     Dose/Rate Route Frequency Ordered Stop   03/29/20 1300  anidulafungin (ERAXIS) 100 mg in sodium chloride 0.9 % 100 mL IVPB       "Followed by" Linked Group Details   100 mg 78 mL/hr over 100 Minutes Intravenous Every 24 hours 03/28/20 1211     03/28/20 1300  anidulafungin (ERAXIS) 200 mg in sodium chloride 0.9 % 200 mL IVPB       "Followed by" Linked Group Details   200 mg 78 mL/hr over 200 Minutes Intravenous  Once 03/28/20 1211 03/28/20 1739   03/27/20 1800  ceFEPIme (MAXIPIME) 2 g in sodium chloride 0.9 % 100 mL IVPB        2 g 200 mL/hr over 30 Minutes Intravenous Every 12 hours 03/27/20 0756     03/27/20 0500  ceFEPIme (MAXIPIME) 2 g in sodium chloride 0.9 % 100 mL IVPB  Status:  Discontinued        2 g 200 mL/hr over 30 Minutes Intravenous Every 8 hours 2020-04-24 2124 03/27/20 0756   04-24-20 2200  ceFEPIme (MAXIPIME) 2 g in sodium chloride 0.9 % 100 mL IVPB  Status:  Discontinued        2 g 200 mL/hr over 30 Minutes Intravenous  Once  03/15/2020 2100 03/02/2020 2124   03/17/2020 2200  metroNIDAZOLE (FLAGYL) IVPB 500 mg        500 mg 100 mL/hr over 60 Minutes Intravenous Every 8 hours 02/29/2020 2100     02/28/2020 2130  ceFEPIme (MAXIPIME) 2 g in sodium chloride 0.9 % 100 mL IVPB        2 g 200 mL/hr over 30 Minutes Intravenous  Once 03/06/2020 2124 03/27/20 0048   02/27/2020 1624  cefoTEtan (CEFOTAN) 2 g in sodium chloride 0.9 % 100 mL IVPB        2 g 200 mL/hr over 30 Minutes Intravenous 30 min pre-op 03/16/2020 1615 02/27/2020 1750   03/04/2020 1100  doxycycline (VIBRAMYCIN) 100 mg in sodium chloride 0.9 % 250 mL IVPB  Status:  Discontinued        100 mg 125 mL/hr over 120 Minutes Intravenous Every 12 hours 03/13/2020 0937 02/28/2020 1630   02/28/2020 1515  doxycycline (VIBRA-TABS) tablet 100 mg  Status:  Discontinued        100 mg Oral Every 12 hours 03/17/2020 1509 03/18/2020 0937      Assessment/Plan: s/p Procedure(s): EXPLORATORY LAPAROTOMY, PARTICAL  COLECTOMY COLOSTOMY Impression: Urine output has significantly improved.  He has remained off Levophed.  I have opened his colostomy and have ordered consultation from nutrition for enteral feeding.  Prognosis is still guarded.  Hemoglobin at 2:08 units of packed red blood cells.  Platelet count of 40,000.  We will continue to monitor.  LOS: 4 days    Franky Macho 03/29/2020

## 2020-03-29 NOTE — Progress Notes (Signed)
Nutrition Follow-up  DOCUMENTATION CODES:   Obesity unspecified  INTERVENTION:  Start Trickle Feeds: -Vital 1.2 at 20 ml/hr with 90 ml ProSource via OG tube TID x 24 hrs   11/04: If tolerating will advance will start advancing to goal -Increase Vital 1.2 by 10 ml every 8 hrs to 50 ml/hr (1200 ml/day) with 90 ml ProSource TID  Regimen will provide 1680 kcal (108% of estimated kcal needs), 156 grams of protein, and 972 ml free water  NUTRITION DIAGNOSIS:   Inadequate oral intake related to inability to eat as evidenced by NPO status. -ongoing GOAL:   Provide needs based on ASPEN/SCCM guidelines -ongoing  MONITOR:   Vent status, Labs, TF tolerance, I & O's, Skin, Weight trends  REASON FOR ASSESSMENT:   Ventilator    ASSESSMENT:   Patient is a 49 yo male with history of severe COPD, CHF, GERD and chronic dyspnea. Presented with shortness of breath.  Patient found to have perforated sigmoid colon and is s/p ex-lap with partial colectomy, colostomy on 2020/04/14.  Post operative hypotension and AKI. Now off pressors, hemodynamics improving, Cr trending down, no acute indications for RRT at this time. Unmatured colostomy opened beside with stool in colostomy today, start tube feedings per surgery. Will initiate trickle feeds and plan to progress towards goal on 11/04 as tolerated.    Moderate BUE, perineal; Deep pitting BLE edema new today.   I/Os: +9140.8 ml since admit UOP: 3450 ml x 24 hrs  Patient is currently intubated and sedated on ventilator support.  MV: 11.4 L/min Temp (24hrs), Avg:98.3 F (36.8 C), Min:97.7 F (36.5 C), Max:98.8 F (37.1 C)  Propofol: none   Medications reviewed and include: Colace, SSI Drips: Maxipime, Flagyl, Eraxis Fentanyl @ 22.5 ml/hr KCl 10 mEq every 1 hr x 2  Labs: CBGs 110,106,108,101,121,107, K 3.3 (L), BUN 66 (H), Cr 3.09 (H), Mg 2.0 (WNL)  Diet Order:   Diet Order            Diet NPO time specified  Diet effective now                  EDUCATION NEEDS:   Not appropriate for education at this time  Skin:  Skin Assessment: Skin Integrity Issues: Skin Integrity Issues:: Incisions Incisions: abdomen  Last BM:  11/2 -5 ml (new ostomy 10/31)  Height:   Ht Readings from Last 1 Encounters:  03/29/20 6' (1.829 m)    Weight:   Wt Readings from Last 1 Encounters:  03/27/20 110.7 kg    BMI:  Body mass index is 33.1 kg/m.  Estimated Nutritional Needs:   Kcal:  9528-4132  Protein:  146  Fluid:  per MD goal   Manuel Richard, RD, LDN Clinical Nutrition After Hours/Weekend Pager # in Amion

## 2020-03-29 NOTE — Progress Notes (Signed)
Pharmacy Antibiotic Note  Manuel Richard is a 49 y.o. male admitted on 14-Apr-2020 with intra abdominal infection.  Pharmacy has been consulted for cefepime dosing.  Plan: Cefepime 2000 mg IV every 12 hours. Monitor labs, c/s, and patient improvement.  Height: 6' (182.9 cm) Weight: 110.7 kg (244 lb 0.8 oz) IBW/kg (Calculated) : 77.6  Temp (24hrs), Avg:98.5 F (36.9 C), Min:97.7 F (36.5 C), Max:98.9 F (37.2 C)  Recent Labs  Lab 03/21/2020 0646 03/08/2020 0646 03/07/2020 2245 03/27/20 0225 03/27/20 0508 03/28/20 0113 03/28/20 0428 03/29/20 0514  WBC 15.8*   < > 6.0  --  9.9 5.8 6.4 8.8  CREATININE 1.12  --  2.27*  --  2.41*  --  3.21* 3.09*  LATICACIDVEN  --   --   --  2.3* 2.0*  --   --   --    < > = values in this interval not displayed.    Estimated Creatinine Clearance: 37.5 mL/min (A) (by C-G formula based on SCr of 3.09 mg/dL (H)).    No Known Allergies  Antimicrobials this admission: 10/31 cefepime >>  Eraxis 11/2 >> 10/30 doxycycline >10/30  Microbiology results: 10/31 Wound abdomen: few yeast  10/31 MRSA PCR: negative    Thank you for allowing pharmacy to be a part of this patients care.  Tad Moore 03/29/2020 11:04 AM

## 2020-03-29 NOTE — Progress Notes (Addendum)
NAME:  Manuel Richard, MRN:  [id number], DOB:  06/25/1970, LOS: 4 ADMISSION DATE:  03/19/2020, CONSULTATION DATE: 03/27/20 (emergency basis as discharged from pulmonary clinic for beligerent behavior)  REFERRING MD:  Wynetta Emery, [facility] , CHIEF COMPLAINT:  GI sepsis, resp failure   Brief History   82 yowm former smoker with extensive pmhx admit 10/30 with apparent aecopd then developed abd pain 10/31 > dx Perf viscus   >>> returned on vent  ex-lap for perforated sigmoid c/b feculent peritonitis and is now s/p sigmoid resection and ostomy creation with circulatory shock, met acidosis and decreased uop on vent for resp failure and PCCM service asked to consult 11/1   History of present illness   From epic notes:  49 y.o. male with medical history significant very severe COPD, longtime smoker with nicotine dependence, chronic pain, diastolic CHF, GERD, hyperlipidemia, supplemental oxygen dependent (2-4L/min), chronic dyspnea who had been followed by pulmonary clinic until he was dismissed from the practice due to belligerent behaviors.  He has been having a difficult time with chronic shortness of breath with acute exacerbations over the past month with multiple emergency department visits.  He was most recently seen in the emergency department on 10/16 where he was treated for an acute exacerbation of COPD and sent home.  He returned to the emergency department today with symptoms of progressive shortness of breath difficulty speaking and poor exercise tolerance.  He has been having cough and shortness of breath with yellowish sputum production.  He also reports chronic leg edema which is mostly unchanged.  He says that his shortness of breath woke him up in the middle of the night.  He is not lying flat recumbent.  His main complaint is the shortness of breath.  He is breathing very fast.  He is adamant that he stopped smoking 1 month ago.  He has a 31+ year history of smoking up to 2 packs a day.  ED  Course: The patient was tachycardic on arrival with a heart rate in the 120 range.  Patient was tachypneic with a respiratory rate of 21.  Temperature 98.6.  Pulse ox 99% on 3 L nasal cannula.  Blood pressure 111/90.  Patient was noted to have hypokalemia with a potassium of 2.8.  BUN 25 creatinine 0.60 calcium 8.5 albumin 2.9 magnesium 1.5 total protein 6.0 AST 14 ALT 25.  Cardiac BNP 73.0.  WBC 18.7.  Hemoglobin 11.2.  Platelet count 224.  Influenza a and B testing negative SARS 2 coronavirus test negative.  Chest x-ray no acute findings.  CT angio chest nondiagnostic beyond main pulmonary arteries due to poor quality study.  EKG with findings of sinus tachycardia.  Patient was given continuous nebulizer treatments.  Patient was given IV magnesium and potassium.  The patient was monitored in the emergency department for several hours however continued to have tachypnea and tachycardia.  He continues to have shortness of breath symptoms.  Admission was requested for further evaluation and management COPD exacerbation acute on chronic respiratory failure with hypoxia.   Past Medical History    Anemia, CHF (congestive heart failure) (HCC), Chronic back pain, Chronic pain syndrome, Chronic pain syndrome, Chronic sinusitis, COPD (chronic obstructive pulmonary disease) (no pfts in [place])  Coronary artery disease, Dyslipidemia, Dysphagia, Epididymitis, Erectile dysfunction, Fatigue, GERD (gastroesophageal reflux disease), Groin pain, High cholesterol, Hydrocele, Lumbar radiculopathy, Nicotine addiction, Peripheral vascular disease ([place]), Rectal bleeding, Shortness of breath dyspnea, Stroke ([place]) (2006), and Testicular pain.   Significant Hospital Events  OR pm 10/31: feculent peritonitis/ sigmoid perf  Off pressors by am 11/2   Consults:  Gen surgery  10/31 >>> PCCM   11/1 >>> Renal 11/2  >>>  Procedures:  R IJ  10/31     >>> Oral et 10/31 >>> L RA  art line 10/31 >>>   Significant Diagnostic Tests:     Micro Data:  COVID 19  10/31 PCR neg MRSA  10/31   PCR  Neg  Abd wound 10/31 >>> few candida   Antimicrobials:  Doxy 10/30 only Cefepime 10/31 >>> Flagyl  10/31 >>> Anidulafungin 11/2     Scheduled Meds: . sodium chloride   Intravenous Once  . buPROPion  150 mg Oral Daily  . chlorhexidine gluconate (MEDLINE KIT)  15 mL Mouth Rinse BID  . Chlorhexidine Gluconate Cloth  6 each Topical Daily  . docusate  100 mg Per Tube BID  . feeding supplement (PROSource TF)  90 mL Per Tube TID  . fluticasone  2 spray Each Nare Daily  . hydrocortisone sodium succinate  25 mg Intravenous Q12H  . insulin aspart  0-9 Units Subcutaneous Q4H  . levalbuterol  0.63 mg Nebulization Q6H  . mouth rinse  15 mL Mouth Rinse 10 times per day  . pantoprazole (PROTONIX) IV  40 mg Intravenous Q24H  . polyethylene glycol  17 g Per Tube Daily  . venlafaxine XR  75 mg Oral Daily   Continuous Infusions: . anidulafungin 78 mL/hr at 03/29/20 1500  . ceFEPime (MAXIPIME) IV Stopped (03/29/20 5035)  . feeding supplement (VITAL AF 1.2 CAL) 1,000 mL (03/29/20 1409)  . fentaNYL infusion INTRAVENOUS 225 mcg/hr (03/29/20 1500)  . metronidazole Stopped (03/29/20 1457)  . norepinephrine (LEVOPHED) Adult infusion Stopped (03/27/20 2257)   PRN Meds:.albuterol, fentaNYL (SUBLIMAZE) injection, fentaNYL (SUBLIMAZE) injection, midazolam, nicotine, ondansetron **OR** ondansetron (ZOFRAN) IV   Interim history/subjective:  Comfortable to fent drip/ breathing just a bit over backup and no air trapping Not on pressors    Objective   Blood pressure 129/80, pulse 92, temperature 98.7 F (37.1 C), temperature source Axillary, resp. rate 16, height 6' (1.829 m), weight 110.7 kg, SpO2 95 %. CVP:  [5 mmHg-24 mmHg] 12 mmHg  Vent Mode: PRVC FiO2 (%):  [40 %] 40 % Set Rate:  [20 bmp] 20 bmp Vt Set:  [620 mL] 620 mL PEEP:  [5 cmH20] 5 cmH20 Plateau Pressure:  [17 cmH20-19 cmH20] 17 cmH20   Intake/Output Summary (Last 24 hours)  at 03/29/2020 1618 Last data filed at 03/29/2020 1500 Gross per 24 hour  Intake 2729.12 ml  Output 2655 ml  Net 74.12 ml   Filed Weights   02/29/2020 0300 03/25/2020 1000 03/27/20 0600  Weight: 102.3 kg 103.2 kg 110.7 kg   CVP:  [5 mmHg-24 mmHg] 12 mmHg   Examination: Tmax  98.8  Pt sedated on fentanyl drip  No jvd Oropharynx et/ og  Neck supple Lungs with a few scattered exp > insp rhonchi bilaterally RRR no s3 or or sign murmur Abd mod distended, limited excursion > starting trickle feeds Extr warm with  clubbing noted - anasarca noted         I personally reviewed images and agree with radiology impression as follows:  CXR:   Portable  11/2 1. Lines and tubes stable position. 2. Cardiomegaly. 3. Low lung volumes with progressive bibasilar atelectasis and infiltrates/edema.      Resolved Hospital Problem list      Assessment & Plan:  1)  GI  sepsis secondary to perf sig with feculent peritonitis - no evidence of abd compartment syndrome  >> off pressors, uop better so agree cut back on fluids and let him auto-diurese   2) Acute vent dep resp failure  - h/o copd  No air trapping but decreased aeration bases predictable given low Cabd so no weaning feasible for now    3) Oliguric AKI secondary to 1)     Lab Results  Component Value Date   CREATININE 3.09 (H) 03/29/2020   CREATININE 3.21 (H) 03/28/2020   CREATININE 2.41 (H) 03/27/2020  good uop, creat has peaked as expected      4) thrombocytopenia in pt with sepsis and underlying cirrhosis Lab Results  Component Value Date   PLT 40 (L) 03/29/2020   PLT 58 (L) 03/28/2020   PLT 58 (L) 03/28/2020   - no evidence dic clinically so 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 Early dic vs liver failure Lab Results  Component Value Date   INR 2.1 (H) 03/29/2020   INR 2.0 (H) 03/28/2020   INR 2.7 (H) 03/27/2020   >> rx  2 units FFP 11/1, no active bleeding so holding  off   6) post  op anemia? Acute blood loss (nothing obvious ongoing)   Lab Results  Component Value Date   HGB 8.0 (L) 03/29/2020   HGB 8.2 (L) 03/28/2020   HGB 6.5 (LL) 03/28/2020  threshold to trx = less than 7 or less than 8 with recurrent pressor dep esp if cvp on low side    7) shock liver in pt with cirrhosis by hx - lfts improved 11/2  8) met acidosis s AG  >>> resolved  Best practice:  Diet: trickle feeds started 11/3  Pain/Anxiety/Delirium protocol (if indicated): post op sedation  VAP protocol (if indicated):  DVT prophylaxis: PAS GI prophylaxis: PPI Glucose control: per [facility]  Mobility: SBR Code Status: full code for now  Family Communication: per gen surgery/ [facility]  Disposition: ICU  Labs   CBC: Recent Labs  Lab 03/08/2020 0646 02/29/2020 0646 03/10/2020 2245 03/18/2020 2245 03/27/20 0508 03/28/20 0113 03/28/20 0428 03/28/20 1900 03/29/20 0514  WBC 15.8*   < > 6.0  --  9.9 5.8 6.4  --  8.8  NEUTROABS 13.1*  --  4.9  --  4.9  --  5.8  --  7.9*  HGB 12.9*   < > 9.7*   < > 8.7* 6.2* 6.5* 8.2* 8.0*  HCT 39.2   < > 30.2*   < > 26.6* 19.2* 20.5* 25.2* 24.6*  MCV 93.8   < > 98.4  --  95.7 98.0 99.0  --  94.3  PLT 260   < > 119*  --  106* 58* 58*  --  40*   < > = values in this interval not displayed.    Basic Metabolic Panel: Recent Labs  Lab 03/05/2020 0646 03/12/2020 2245 03/27/20 0508 03/28/20 0428 03/29/20 0514  NA 135 135 133* 137 140  K 3.2* 4.3 4.4 4.8 3.3*  CL 97* 106 103 107 106  CO2 22 20* 20* 19* 19*  GLUCOSE 264* 91 96 115* 101*  BUN 33* 38* 43* 61* 66*  CREATININE 1.12 2.27* 2.41* 3.21* 3.09*  CALCIUM 9.0 7.4* 8.0* 8.2* 8.4*  MG 2.0 2.1 1.8 1.9 2.0  PHOS  --  6.7*  --  6.7*  --    GFR: Estimated Creatinine Clearance: 37.5 mL/min (A) (by C-G formula  based on SCr of 3.09 mg/dL (H)). Recent Labs  Lab 03/20/2020 2245 03/27/20 0225 03/27/20 0508 03/28/20 0113 03/28/20 0428 03/29/20 0514  WBC   < >  --  9.9 5.8 6.4 8.8  LATICACIDVEN  --  2.3* 2.0*  --   --    --    < > = values in this interval not displayed.    Liver Function Tests: Recent Labs  Lab 03/25/2020 0646 03/09/2020 2245 03/27/20 0508 03/28/20 0428 03/29/20 0514  AST 16 3,983* 4,809* 892* 240*  ALT 24 2,470* 2,826* 1,303* 886*  ALKPHOS 103 56 51 58 89  BILITOT 0.9 0.9 1.4* 1.5* 1.2  PROT 6.0* 3.8* 4.4* 4.8* 4.7*  ALBUMIN 2.9* 2.0* 2.8* 3.1* 2.7*   No results for input(s): LIPASE, AMYLASE in the last 168 hours. No results for input(s): AMMONIA in the last 168 hours.  ABG    Component Value Date/Time   PHART 7.282 (L) 03/04/2020 2225   PCO2ART 42.1 03/10/2020 2225   PO2ART 78.2 (L) 03/03/2020 2225   HCO3 19.0 (L) 03/17/2020 2225   ACIDBASEDEF 6.3 (H) 03/07/2020 2225   O2SAT 92.0 03/05/2020 2225     Coagulation Profile: Recent Labs  Lab 03/07/2020 2245 03/27/20 1106 03/28/20 0730 03/29/20 0514  INR 3.2* 2.7* 2.0* 2.1*    Cardiac Enzymes: No results for input(s): CKTOTAL, CKMB, CKMBINDEX, TROPONINI in the last 168 hours.  HbA1C: Hgb A1c MFr Bld  Date/Time Value Ref Range Status  02/25/2020 07:25 AM 6.0 (H) 4.8 - 5.6 % Final    Comment:    (NOTE) Pre diabetes:          5.7%-6.4%  Diabetes:              >6.4%  Glycemic control for   <7.0% adults with diabetes     CBG: Recent Labs  Lab 03/28/20 2004 03/29/20 0031 03/29/20 0402 03/29/20 0758 03/29/20 1147  GLUCAP 121* 101* 108* 106* 110*    The patient is critically ill with multiple organ systems failure and requires high complexity decision making for assessment and support, frequent evaluation and titration of therapies, application of advanced monitoring technologies and extensive interpretation of multiple databases. Critical Care Time devoted to patient care services described in this note is 35 minutes.    Christinia Gully, MD [facility] [place] [phone]   After 7:00 pm call [place]  [phone]

## 2020-03-29 NOTE — Progress Notes (Signed)
Dislodged by patient

## 2020-03-29 NOTE — Progress Notes (Signed)
[facility] Progress Note    Assessment/ Plan:   Assessment/Plan: 49 year old WM with severe COPD and pulm HTN-  Presented with what was thought to be COPD exacerbation-  Developed sepsis- found to have perforated sigmoid colon s/p operative intervention with post op hypotension and AKI  1.Renal- AKI in the setting of sepsis/s/p operative intervention for perforated sigmoid colon.  Likely ATN.  Fortunately there are no acute indications for RRT at this time and his hemodynamics seem to be improved with UOP , now off pressors.  Cr coming down.  No indication for RRT at present, continue supportive care  2. Hypertension/volume  - has been volume resuscitated with good results-  CVP in the low teens, off pressor.  Will stop IVFs for now, was getting 30 IV Lasix, given today.  Will d/c for tomorrow anticipating robust UOP  3. Perforated viscous- s/o resection and ostomy, on broad spectrum IV antibiotics and antifungals 4. Metabolic acidosis-  Due to #1- s/p bicarb 5. VDRF-  Per CCM-  Has baseline bad lung and heart pathology- likely will make a difficult vent wean 6. Anemia  - situational but severe-  s/p pRBCs yesterday 7.  Dispo: remains in ICU  Subjective:    UOP picking up and Cr coming down.  Remains intubated and sedated. Off pressor   Objective:   BP 113/60   Pulse 94   Temp 97.7 F (36.5 C) (Oral)   Resp 20   Ht 6' (1.829 m)   Wt 110.7 kg   SpO2 95%   BMI 33.10 kg/m   Intake/Output Summary (Last 24 hours) at 03/29/2020 1024 Last data filed at 03/29/2020 1000 Gross per 24 hour  Intake 3330.93 ml  Output 4005 ml  Net -674.07 ml   Weight change:   Physical Exam: Gen: intubated, sedated CVS: RRR Resp: coarse bilaterally Abd: soft, midline abd incision c/d/i, colostomy LLQ with bag over it, gauze in the bag Ext: 2+ LE edema  Imaging: DG Chest Port 1 View  Result Date: 03/28/2020 CLINICAL DATA:  Acute respiratory failure. Shortness of breath. Productive  cough. EXAM: PORTABLE CHEST 1 VIEW COMPARISON:  03/20/2020. FINDINGS: Endotracheal tube and right IJ line in stable position. Cardiomegaly. Low lung volumes with progressive bibasilar atelectasis and infiltrates/edema. Small left pleural effusion. Biapical pleural thickening again noted consistent scarring. No pneumothorax. IMPRESSION: 1. Lines and tubes stable position. 2. Cardiomegaly. 3. Low lung volumes with progressive bibasilar atelectasis and infiltrates/edema. Electronically Signed   By: Marcello Moores  Register   On: 03/28/2020 05:40   DG Chest Port 1V same Day  Result Date: 03/28/2020 CLINICAL DATA:  Encounter for orogastric tube placement. EXAM: PORTABLE CHEST 1 VIEW COMPARISON:  Chest radiograph performed earlier the same day 03/28/2020. FINDINGS: Problem-oriented examination for enteric tube placement. The upper chest and lung apices are excluded from the field of view. Interval placement of an enteric tube which passes below level left hemidiaphragm with tip projecting in the region of the stomach. Otherwise, no significant change in the visualized thorax as compared to the chest radiograph performed earlier today. IMPRESSION: Interval placement of an enteric tube with tip projecting in the expected location of the stomach. Electronically Signed   By: Kellie Simmering DO   On: 03/28/2020 11:33   ECHOCARDIOGRAM COMPLETE  Result Date: 03/27/2020    ECHOCARDIOGRAM REPORT   Patient Name:   Manuel Richard Date of Exam: 03/27/2020 Medical Rec #:  [id number]      Height:  72.0 in Accession #:    [id number]     Weight:       244.0 lb Date of Birth:  October 26, 1970      BSA:          2.319 m Patient Age:    78 years       BP:           134/83 mmHg Patient Gender: M              HR:           107 bpm. Exam Location:  [facility] Procedure: 2D Echo, Cardiac Doppler and Color Doppler Indications:    I50.9* Heart failure (unspecified)  History:        Patient has no prior history of Echocardiogram examinations.                  Abnormal ECG, COPD; Risk Factors:Dyslipidemia. Septic shock.  Sonographer:    Roseanna Rainbow RDCS (AE) Referring Phys: [id number] Murlean Iba  Sonographer Comments: Technically difficult study due to poor echo windows, patient is morbidly obese and echo performed with patient supine and on artificial respirator. Image acquisition challenging due to patient body habitus. IMPRESSIONS  1. Left ventricular ejection fraction, by estimation, is 65 to 70%. The left ventricle has normal function. The left ventricle has no regional wall motion abnormalities. There is mild left ventricular hypertrophy. Left ventricular diastolic parameters were normal.  2. Right ventricular systolic function is normal. The right ventricular size is normal.  3. The mitral valve is normal in structure. No evidence of mitral valve regurgitation. No evidence of mitral stenosis.  4. The aortic valve is tricuspid. Aortic valve regurgitation is not visualized. No aortic stenosis is present.  5. Aortic dilatation noted. There is mild dilatation of the aortic root, measuring 41 mm. FINDINGS  Left Ventricle: Left ventricular ejection fraction, by estimation, is 65 to 70%. The left ventricle has normal function. The left ventricle has no regional wall motion abnormalities. The left ventricular internal cavity size was normal in size. There is  mild left ventricular hypertrophy. Left ventricular diastolic parameters were normal. Right Ventricle: The right ventricular size is normal. No increase in right ventricular wall thickness. Right ventricular systolic function is normal. Left Atrium: Left atrial size was normal in size. Right Atrium: Right atrial size was normal in size. Pericardium: There is no evidence of pericardial effusion. Mitral Valve: The mitral valve is normal in structure. No evidence of mitral valve regurgitation. No evidence of mitral valve stenosis. Tricuspid Valve: The tricuspid valve is normal in structure. Tricuspid valve  regurgitation is not demonstrated. No evidence of tricuspid stenosis. Aortic Valve: The aortic valve is tricuspid. Aortic valve regurgitation is not visualized. No aortic stenosis is present. Aortic valve mean gradient measures 4.9 mmHg. Aortic valve peak gradient measures 8.5 mmHg. Aortic valve area, by VTI measures 4.22 cm. Pulmonic Valve: The pulmonic valve was not well visualized. Pulmonic valve regurgitation is not visualized. No evidence of pulmonic stenosis. Aorta: The aortic root is normal in size and structure and aortic dilatation noted. There is mild dilatation of the aortic root, measuring 41 mm. Venous: IVC assessment for right atrial pressure unable to be performed due to mechanical ventilation. IAS/Shunts: No atrial level shunt detected by color flow Doppler.  LEFT VENTRICLE PLAX 2D LVIDd:         4.50 cm      Diastology LVIDs:         3.15 cm  LV e' medial:   9.79 cm/s LV PW:         1.20 cm      LV E/e' medial: 9.2 LV IVS:        1.24 cm LVOT diam:     2.60 cm LV SV:         106 LV SV Index:   46 LVOT Area:     5.31 cm  LV Volumes (MOD) LV vol d, MOD A2C: 144.0 ml LV vol d, MOD A4C: 123.0 ml LV vol s, MOD A2C: 63.9 ml LV vol s, MOD A4C: 54.5 ml LV SV MOD A2C:     80.1 ml LV SV MOD A4C:     123.0 ml LV SV MOD BP:      74.4 ml RIGHT VENTRICLE RV S prime:     18.80 cm/s TAPSE (M-mode): 2.2 cm LEFT ATRIUM           Index       RIGHT ATRIUM           Index LA diam:      4.00 cm 1.73 cm/m  RA Area:     20.30 cm LA Vol (A2C): 34.3 ml 14.79 ml/m RA Volume:   63.80 ml  27.52 ml/m LA Vol (A4C): 61.0 ml 26.31 ml/m  AORTIC VALVE AV Area (Vmax):    4.67 cm AV Area (Vmean):   4.50 cm AV Area (VTI):     4.22 cm AV Vmax:           145.51 cm/s AV Vmean:          103.499 cm/s AV VTI:            0.251 m AV Peak Grad:      8.5 mmHg AV Mean Grad:      4.9 mmHg LVOT Vmax:         128.00 cm/s LVOT Vmean:        87.800 cm/s LVOT VTI:          0.200 m LVOT/AV VTI ratio: 0.80  AORTA Ao Root diam: 3.80 cm Ao Asc  diam:  3.80 cm MITRAL VALVE MV Area (PHT): 8.72 cm     SHUNTS MV Decel Time: 87 msec      Systemic VTI:  0.20 m MV E velocity: 90.30 cm/s   Systemic Diam: 2.60 cm MV A velocity: 104.00 cm/s MV E/A ratio:  0.87 Carlyle Dolly MD Electronically signed by Carlyle Dolly MD Signature Date/Time: 03/27/2020/3:39:01 PM    Final     Labs: BMET Recent Labs  Lab 02/25/2020 0725 03/01/2020 4103 02/27/2020 2245 03/27/20 0508 03/28/20 0428 03/29/20 0514  NA 136 135 135 133* 137 140  K 2.8* 3.2* 4.3 4.4 4.8 3.3*  CL 98 97* 106 103 107 106  CO2 27 22 20* 20* 19* 19*  GLUCOSE 137* 264* 91 96 115* 101*  BUN 25* 33* 38* 43* 61* 66*  CREATININE 0.60* 1.12 2.27* 2.41* 3.21* 3.09*  CALCIUM 8.5* 9.0 7.4* 8.0* 8.2* 8.4*  PHOS  --   --  6.7*  --  6.7*  --    CBC Recent Labs  Lab 02/27/2020 2245 03/09/2020 2245 03/27/20 0508 03/27/20 0508 03/28/20 0113 03/28/20 0428 03/28/20 1900 03/29/20 0514  WBC 6.0   < > 9.9  --  5.8 6.4  --  8.8  NEUTROABS 4.9  --  4.9  --   --  5.8  --  7.9*  HGB 9.7*   < >  8.7*   < > 6.2* 6.5* 8.2* 8.0*  HCT 30.2*   < > 26.6*   < > 19.2* 20.5* 25.2* 24.6*  MCV 98.4   < > 95.7  --  98.0 99.0  --  94.3  PLT 119*   < > 106*  --  58* 58*  --  40*   < > = values in this interval not displayed.    Medications:    . sodium chloride   Intravenous Once  . buPROPion  150 mg Oral Daily  . chlorhexidine gluconate (MEDLINE KIT)  15 mL Mouth Rinse BID  . Chlorhexidine Gluconate Cloth  6 each Topical Daily  . docusate  100 mg Per Tube BID  . fluticasone  2 spray Each Nare Daily  . furosemide  30 mg Intravenous Daily  . hydrocortisone sodium succinate  50 mg Intravenous Q12H  . insulin aspart  0-9 Units Subcutaneous Q4H  . levalbuterol  0.63 mg Nebulization Q6H  . mouth rinse  15 mL Mouth Rinse 10 times per day  . pantoprazole (PROTONIX) IV  40 mg Intravenous Q24H  . polyethylene glycol  17 g Per Tube Daily  . silver nitrate applicators  1 application Topical Once  . venlafaxine XR   75 mg Oral Daily      Madelon Lips, MD 03/29/2020, 10:24 AM

## 2020-03-29 NOTE — Progress Notes (Signed)
Art line dislodged. Dr. Laural Benes made aware. Okay to leave out per MD.

## 2020-03-30 ENCOUNTER — Inpatient Hospital Stay (HOSPITAL_COMMUNITY): Payer: Medicare Other

## 2020-03-30 LAB — CBC WITH DIFFERENTIAL/PLATELET
Abs Immature Granulocytes: 0.29 10*3/uL — ABNORMAL HIGH (ref 0.00–0.07)
Abs Immature Granulocytes: 1.01 10*3/uL — ABNORMAL HIGH (ref 0.00–0.07)
Basophils Absolute: 0 10*3/uL (ref 0.0–0.1)
Basophils Absolute: 0 10*3/uL (ref 0.0–0.1)
Basophils Relative: 0 %
Basophils Relative: 0 %
Eosinophils Absolute: 0 10*3/uL (ref 0.0–0.5)
Eosinophils Absolute: 0 10*3/uL (ref 0.0–0.5)
Eosinophils Relative: 0 %
Eosinophils Relative: 0 %
HCT: 28.4 % — ABNORMAL LOW (ref 39.0–52.0)
HCT: 42.6 % (ref 39.0–52.0)
Hemoglobin: 9.1 g/dL — ABNORMAL LOW (ref 13.0–17.0)
Hemoglobin: 13.5 g/dL (ref 13.0–17.0)
Immature Granulocytes: 3 %
Immature Granulocytes: 5 %
Lymphocytes Relative: 7 %
Lymphocytes Relative: 5 %
Lymphs Abs: 0.6 10*3/uL — ABNORMAL LOW (ref 0.7–4.0)
Lymphs Abs: 1 10*3/uL (ref 0.7–4.0)
MCH: 30 pg (ref 26.0–34.0)
MCH: 30.1 pg (ref 26.0–34.0)
MCHC: 32 g/dL (ref 30.0–36.0)
MCHC: 31.7 g/dL (ref 30.0–36.0)
MCV: 93.7 fL (ref 80.0–100.0)
MCV: 94.9 fL (ref 80.0–100.0)
Monocytes Absolute: 0.2 10*3/uL (ref 0.1–1.0)
Monocytes Absolute: 0.4 10*3/uL (ref 0.1–1.0)
Monocytes Relative: 2 %
Monocytes Relative: 2 %
Neutro Abs: 7.6 10*3/uL (ref 1.7–7.7)
Neutro Abs: 17.3 10*3/uL — ABNORMAL HIGH (ref 1.7–7.7)
Neutrophils Relative %: 88 %
Neutrophils Relative %: 88 %
Platelets: 46 10*3/uL — ABNORMAL LOW (ref 150–400)
Platelets: 62 10*3/uL — ABNORMAL LOW (ref 150–400)
RBC: 3.03 MIL/uL — ABNORMAL LOW (ref 4.22–5.81)
RBC: 4.49 MIL/uL (ref 4.22–5.81)
RDW: 15.6 % — ABNORMAL HIGH (ref 11.5–15.5)
RDW: 15.8 % — ABNORMAL HIGH (ref 11.5–15.5)
WBC Morphology: INCREASED
WBC Morphology: INCREASED
WBC: 8.7 10*3/uL (ref 4.0–10.5)
WBC: 19.7 10*3/uL — ABNORMAL HIGH (ref 4.0–10.5)
nRBC: 0.5 % — ABNORMAL HIGH (ref 0.0–0.2)
nRBC: 1.8 % — ABNORMAL HIGH (ref 0.0–0.2)

## 2020-03-30 LAB — COMPREHENSIVE METABOLIC PANEL
ALT: 611 U/L — ABNORMAL HIGH (ref 0–44)
AST: 72 U/L — ABNORMAL HIGH (ref 15–41)
Albumin: 2.5 g/dL — ABNORMAL LOW (ref 3.5–5.0)
Alkaline Phosphatase: 123 U/L (ref 38–126)
Anion gap: 13 (ref 5–15)
BUN: 67 mg/dL — ABNORMAL HIGH (ref 6–20)
CO2: 22 mmol/L (ref 22–32)
Calcium: 9 mg/dL (ref 8.9–10.3)
Chloride: 107 mmol/L (ref 98–111)
Creatinine, Ser: 2.74 mg/dL — ABNORMAL HIGH (ref 0.61–1.24)
GFR, Estimated: 28 mL/min — ABNORMAL LOW (ref >60)
Glucose, Bld: 126 mg/dL — ABNORMAL HIGH (ref 70–99)
Potassium: 2.8 mmol/L — ABNORMAL LOW (ref 3.5–5.1)
Sodium: 142 mmol/L (ref 135–145)
Total Bilirubin: 1.4 mg/dL — ABNORMAL HIGH (ref 0.3–1.2)
Total Protein: 4.7 g/dL — ABNORMAL LOW (ref 6.5–8.1)

## 2020-03-30 LAB — GLUCOSE, CAPILLARY
Glucose-Capillary: 107 mg/dL — ABNORMAL HIGH (ref 70–99)
Glucose-Capillary: 111 mg/dL — ABNORMAL HIGH (ref 70–99)
Glucose-Capillary: 119 mg/dL — ABNORMAL HIGH (ref 70–99)
Glucose-Capillary: 121 mg/dL — ABNORMAL HIGH (ref 70–99)
Glucose-Capillary: 122 mg/dL — ABNORMAL HIGH (ref 70–99)
Glucose-Capillary: 124 mg/dL — ABNORMAL HIGH (ref 70–99)
Glucose-Capillary: 98 mg/dL (ref 70–99)

## 2020-03-30 LAB — PROTIME-INR
INR: 2.5 — ABNORMAL HIGH (ref 0.8–1.2)
Prothrombin Time: 26.6 seconds — ABNORMAL HIGH (ref 11.4–15.2)

## 2020-03-30 LAB — MAGNESIUM: Magnesium: 1.9 mg/dL (ref 1.7–2.4)

## 2020-03-30 LAB — TRIGLYCERIDES: Triglycerides: 108 mg/dL (ref <150)

## 2020-03-30 MED ORDER — VITAL AF 1.2 CAL PO LIQD
1000.0000 mL | ORAL | Status: DC
  Administered 2020-03-30: 1000 mL

## 2020-03-30 MED ORDER — POTASSIUM CHLORIDE 10 MEQ/100ML IV SOLN
10.0000 meq | INTRAVENOUS | Status: AC
  Administered 2020-03-30 (×4): 10 meq via INTRAVENOUS
  Filled 2020-03-30 (×3): qty 100

## 2020-03-30 MED ORDER — METOPROLOL TARTRATE 5 MG/5ML IV SOLN
5.0000 mg | Freq: Once | INTRAVENOUS | Status: AC
  Administered 2020-03-30: 5 mg via INTRAVENOUS
  Filled 2020-03-30: qty 5

## 2020-03-30 MED ORDER — ONDANSETRON HCL 4 MG PO TABS: 4.0000 mg | ORAL_TABLET | Freq: Four times a day (QID) | ORAL | Status: DC | PRN

## 2020-03-30 MED ORDER — LACTATED RINGERS IV BOLUS
1000.0000 mL | Freq: Once | INTRAVENOUS | Status: AC
  Administered 2020-03-30: 1000 mL via INTRAVENOUS

## 2020-03-30 MED ORDER — VENLAFAXINE HCL 37.5 MG PO TABS
37.5000 mg | ORAL_TABLET | Freq: Two times a day (BID) | ORAL | Status: DC
  Administered 2020-03-30 (×2): 37.5 mg
  Filled 2020-03-30 (×2): qty 1

## 2020-03-30 MED ORDER — ONDANSETRON HCL 4 MG/2ML IJ SOLN: 4.0000 mg | Freq: Four times a day (QID) | INTRAMUSCULAR | Status: DC | PRN

## 2020-03-30 MED ORDER — POTASSIUM CHLORIDE CRYS ER 10 MEQ PO TBCR
40.0000 meq | EXTENDED_RELEASE_TABLET | ORAL | Status: AC
  Administered 2020-03-30 (×2): 40 meq via ORAL
  Filled 2020-03-30: qty 2
  Filled 2020-03-30: qty 4

## 2020-03-30 MED ORDER — ACETAMINOPHEN 325 MG PO TABS
650.0000 mg | ORAL_TABLET | Freq: Four times a day (QID) | ORAL | Status: DC | PRN
  Administered 2020-03-30 – 2020-03-31 (×2): 650 mg via ORAL
  Filled 2020-03-30 (×2): qty 2

## 2020-03-30 MED ORDER — LEVALBUTEROL HCL 0.63 MG/3ML IN NEBU
0.6300 mg | INHALATION_SOLUTION | Freq: Three times a day (TID) | RESPIRATORY_TRACT | Status: DC
  Administered 2020-03-30 – 2020-03-31 (×4): 0.63 mg via RESPIRATORY_TRACT
  Filled 2020-03-30 (×4): qty 3

## 2020-03-30 NOTE — Progress Notes (Signed)
[facility] Progress Note    Assessment/ Plan:   Assessment/Plan: 49 year old WM with severe COPD and pulm HTN-  Presented with what was thought to be COPD exacerbation-  Developed sepsis- found to have perforated sigmoid colon s/p operative intervention with post op hypotension and AKI  1.Renal- AKI in the setting of sepsis/s/p operative intervention for perforated sigmoid colon.  Likely ATN.  His hemodynamics seem to be improved with UOP , now off pressors.  Cr coming down.  No indication for RRT at present, continue supportive care.    2. Hypertension/volume  - has been volume resuscitated with good results, CVP adequate and off pressor. Stopped IVFs, anticipate post- ATN diuresis given robust UOP so will hold Lasix today.   3. Perforated viscous- s/o resection and ostomy, on broad spectrum IV antibiotics and antifungals 4. Metabolic acidosis-  Due to #1- s/p bicarb, resolved 5. VDRF-  Per CCM-  Has baseline bad lung and heart pathology- likely will make a difficult vent wean 6. Anemia/ thrombocytopenia: supportive care per primary 7. Coagulopathy: inr 2.5 this AM 8.  Dispo: remains in ICU  Subjective:    Continues to improve.  Cr coming down.  Hypokalemic this AM, repleted.  Met acidosis better.     Objective:   BP 107/77   Pulse (!) 113   Temp 98.6 F (37 C) (Oral)   Resp 20   Ht 6' (1.829 m)   Wt 108.5 kg   SpO2 98%   BMI 32.44 kg/m   Intake/Output Summary (Last 24 hours) at 03/30/2020 0932 Last data filed at 03/30/2020 0600 Gross per 24 hour  Intake 2443.91 ml  Output 3500 ml  Net -1056.09 ml   Weight change:   Physical Exam: Gen: intubated, sedated CVS: RRR Resp: coarse bilaterally Abd: soft, midline abd incision c/d/i, colostomy LLQ with bag over it, pink Ext: 2+ LE edema  Imaging: DG Chest Port 1V same Day  Result Date: 03/28/2020 CLINICAL DATA:  Encounter for orogastric tube placement. EXAM: PORTABLE CHEST 1 VIEW COMPARISON:  Chest  radiograph performed earlier the same day 03/28/2020. FINDINGS: Problem-oriented examination for enteric tube placement. The upper chest and lung apices are excluded from the field of view. Interval placement of an enteric tube which passes below level left hemidiaphragm with tip projecting in the region of the stomach. Otherwise, no significant change in the visualized thorax as compared to the chest radiograph performed earlier today. IMPRESSION: Interval placement of an enteric tube with tip projecting in the expected location of the stomach. Electronically Signed   By: Kellie Simmering DO   On: 03/28/2020 11:33    Labs: BMET Recent Labs  Lab 02/25/2020 0725 03/05/2020 9379 03/16/2020 2245 03/27/20 0508 03/28/20 0428 03/29/20 0514 03/30/20 0347  NA 136 135 135 133* 137 140 142  K 2.8* 3.2* 4.3 4.4 4.8 3.3* 2.8*  CL 98 97* 106 103 107 106 107  CO2 27 22 20* 20* 19* 19* 22  GLUCOSE 137* 264* 91 96 115* 101* 126*  BUN 25* 33* 38* 43* 61* 66* 67*  CREATININE 0.60* 1.12 2.27* 2.41* 3.21* 3.09* 2.74*  CALCIUM 8.5* 9.0 7.4* 8.0* 8.2* 8.4* 9.0  PHOS  --   --  6.7*  --  6.7*  --   --    CBC Recent Labs  Lab 03/27/20 0508 03/27/20 0508 03/28/20 0113 03/28/20 0113 03/28/20 0428 03/28/20 1900 03/29/20 0514 03/30/20 0347  WBC 9.9   < > 5.8  --  6.4  --  8.8 8.7  NEUTROABS 4.9  --   --   --  5.8  --  7.9* 7.6  HGB 8.7*   < > 6.2*   < > 6.5* 8.2* 8.0* 9.1*  HCT 26.6*   < > 19.2*   < > 20.5* 25.2* 24.6* 28.4*  MCV 95.7   < > 98.0  --  99.0  --  94.3 93.7  PLT 106*   < > 58*  --  58*  --  40* 46*   < > = values in this interval not displayed.    Medications:    . sodium chloride   Intravenous Once  . buPROPion  150 mg Oral Daily  . chlorhexidine gluconate (MEDLINE KIT)  15 mL Mouth Rinse BID  . Chlorhexidine Gluconate Cloth  6 each Topical Daily  . docusate  100 mg Per Tube BID  . feeding supplement (PROSource TF)  90 mL Per Tube TID  . fluticasone  2 spray Each Nare Daily  .  hydrocortisone sodium succinate  25 mg Intravenous Q12H  . insulin aspart  0-9 Units Subcutaneous Q4H  . levalbuterol  0.63 mg Nebulization TID  . mouth rinse  15 mL Mouth Rinse 10 times per day  . pantoprazole (PROTONIX) IV  40 mg Intravenous Q24H  . polyethylene glycol  17 g Per Tube Daily  . potassium chloride  40 mEq Oral Q3H  . venlafaxine XR  75 mg Oral Daily      Madelon Lips, MD 03/30/2020, 9:32 AM

## 2020-03-30 NOTE — Progress Notes (Signed)
MD made aware of gastric residual, care order to hold tube feeding until 2000, and then restart at 20 ml/hr, and increase by 10 ml/hr Q8 hours until goal rate of 50 ml/hr is met. Will continue to monitor.

## 2020-03-30 NOTE — Progress Notes (Signed)
Patient's sedation has been decreased by half and is tolerating well. Patient is sustaining in sinus tach with a heart rate of 123-126. Dr. Marisa Severin notified with no new orders. Patient has not had any output from colostomy. Tube feeding rate has been increased per order by 10 ml every 8 hours and is now running at 40 ml/hr. I checked gastric residual with a return of 200 ml and returned via tube. Will continue to monitor.

## 2020-03-30 NOTE — Progress Notes (Signed)
PROGRESS NOTE   Manuel Richard  JOI:[id number] DOB: 1970/07/22 DOA: 03/17/2020 PCP: Martin Majestic, FNP   Chief Complaint  Patient presents with  . Shortness of Breath   Brief Admission History:  49 y.o. male with medical history significant very severe COPD, longtime smoker with nicotine dependence, chronic pain, diastolic CHF, GERD, hyperlipidemia, supplemental oxygen dependent (2-4L/min), chronic dyspnea who had been followed by pulmonary clinic until he was dismissed from the practice due to belligerent behaviors.  He has been having a difficult time with chronic shortness of breath with acute exacerbations over the past month with multiple emergency department visits.  He was most recently seen in the emergency department on 1016 where he was treated for an acute exacerbation of COPD and sent home.  He returned to the emergency department today with symptoms of progressive shortness of breath difficulty speaking and poor exercise tolerance.  He has been having cough and shortness of breath with yellowish sputum production.  He also reports chronic leg edema which is mostly unchanged.  He says that his shortness of breath woke him up in the middle of the night.  He is not lying flat recumbent.  His main complaint is the shortness of breath.  He is breathing very fast.  He is adamant that he stopped smoking 1 month ago.  He has a 31+ year history of smoking up to 2 packs a day.  On 10/31 pt developed increasing respiratory distress on the med surg floor.  He was started on bipap therapy and transferred to stepdown ICU on bipap.  Wife confirmed that patient is full code.   Assessment & Plan:   Principal Problem:   COPD with acute exacerbation ([place]) Active Problems:   Drug-seeking behavior   Back pain at L4-L5 level   Dysphagia   Chronic pain syndrome   Dyslipidemia   Vitamin D deficiency   Chronic lower extremity pain (Secondary Area of Pain) (Left)   Long term current use of opiate  analgesic   GERD (gastroesophageal reflux disease)   Acute and chronic respiratory failure with hypoxia (HCC)   Leukocytosis   Sinus tachycardia   Tachypnea   Perforation of viscus   Severe sepsis with septic shock (HCC)   Pressure injury of skin   1)Severe Sepsis on Admission with Septic Shock following admission secondary to abdominal infection/feculent peritonitis -  -Continue IV cefepime and Metronidazole. -c/n Eraxis (antifungal) -Patient is off Levophed drip -Taper of hydrocortisone --Intra-abdominal wound cultures from 02/29/2020 with yeast otherwise negative for bacteria at this time  -WBC trending down -Hemodynamically improving with improvement in sepsis pathophysiology -Echo with preserved EF of 65 to 70% without regional wall motion normalities or significant diastolic dysfunction  2)Perforated Sigmoid Colon with feculent peritonitis --status post exploratory Laparotomy, Sigmoid Colectomy with End Colostomy.  --03/07/2020 --on 03/04/2020 Pt underwent exploratory lap with lysis of adhesions, sigmoidectomy with left colostomy placement -by Dr. Ronny Bacon --Further management General surgeon Dr. Arnoldo Morale -continue Jevity tube feeding started on 03/29/2020-- currently at 30 ml/hr  3)AKI - likely ATN in setting of severe sepsis and hypotension.   -Nephrology consult appreciated, recommending discontinuation of fluids and IV Lasix -Creatinine is 2.74 (peak was 3.21, baseline usually around 1) -Anticipating resolution without need for RRT - renally adjust medications, avoid nephrotoxic agents / dehydration  / hypotension  4)Acute respiratory failure with hypoxia-secondary to COPD with acute exacerbation and subsequent sepsis and septic shock from intra-abdominal infection - pt remains intubated and sedated.  5)Elevated LFTs - Possibly from Shock liver/Hypotension AST down to 72 from a peak of 4809 ALT down to 611 from peak of 2,826 Alk Phos -WNL T Bil down to 1.4 from a  peak of 1.5  6)Acute blood loss anemia/thrombocytopenia-postoperative blood loss, -Hgb is stable around 9 after transfusion of 2 units of PRBC -INR around 2.5 down from 3.2 -Platelets is 46 K-- ??  Sepsis related and compounded by thrombocytopenia of consumption due to perioperative bleeding -Patient is not on heparin or Lovenox at this time, continue to watch for possible DIC   7)Steroid-induced Hyperglycemia--Use Novolog/Humalog Sliding scale insulin with Accu-Cheks/Fingersticks as ordered   8)Chronic constipation-He said he had been taking Linzess prior to admission.    9)Hypokalemia / Hypomagnesemia - replace and recheck  CRITICAL CARE Performed by: Roxan Hockey   Total critical care time: 43 minutes  Critical care time was exclusive of separately billable procedures and treating other patients.  Critical care was necessary to treat or prevent imminent or life-threatening deterioration. -Vent Settings: -PRVC/40/5/20/620  Critical care was time spent personally by me on the following activities: development of treatment plan with patient and/or surrogate as well as nursing, discussions with consultants, evaluation of patient's response to treatment, examination of patient, obtaining history from patient or surrogate, ordering and performing treatments and interventions, ordering and review of laboratory studies, ordering and review of radiographic studies, pulse oximetry and re-evaluation of patient's condition.   DVT prophylaxis: SCDs (low platelets) GI flushes with IV Protonix Code Status: Full Family Communication: wife updated by telephone, she confirmed full code status, asked for [place] to update her only  Disposition Plan: Home when medically stabilized Consults called: PCCM, Gen Surgery, Nephrology Admission status: INP  Status is: Inpatient  Remains inpatient appropriate because:Hemodynamically unstable, IV treatments appropriate due to intensity of illness or  inability to take PO and Inpatient level of care appropriate due to severity of illness  Dispo: The patient is from: Home              Anticipated d/c is to: Home              Anticipated d/c date is: > 3 days              Patient currently is not medically stable to d/c.  Consultants:   PCCM   Procedures:  Intubated 03/01/2020 -Rt IJ  -status post exploratory Laparotomy, Sigmoid Colectomy with End Colostomy.  --03/16/2020   Antimicrobials:  Doxycycline 10/30>>  Cefepime Erasix  Subjective: -Remains intubated and sedated -Urine output improving Afebrile -Patient did not do well with sedation vacation, became tachycardic  Objective: Vitals:   03/30/20 1515 03/30/20 1530 03/30/20 1545 03/30/20 1600  BP: 95/71 100/77 106/65 113/81  Pulse: (!) 124 (!) 119 (!) 123 (!) 41  Resp: _0 Temp:      TempSrc:      SpO2: 97% 97% 97% 97%  Weight:      Height:        Intake/Output Summary (Last 24 hours) at 03/30/2020 1605 Last data filed at 03/30/2020 1604 Gross per 24 hour  Intake 1802.81 ml  Output 3325 ml  Net -1522.19 ml   Filed Weights   03/19/2020 1000 03/27/20 0600 03/30/20 0500  Weight: 103.2 kg 110.7 kg 108.5 kg   Examination:  General exam: sedated and intubated in ICU. Chronically ill appearing  Respiratory system: intubated and mechanically ventilated good air movement bilateral with no wheezes or rhonchi heard.  Cardiovascular  system: right sided IJ central line in place.  Normal S1 & S2 heard,   Gastrointestinal system: Abdomen is obese, slightly less distended, ostomy appears viable, minimal BS heard, incisional wounds clean and dry.  Central nervous system: sedated on vent.  Extremities: 1+ pretibial edema BLEs,  onychomycosis.  Skin: chronic venous stasis changes.   Psychiatry: sedated on vent in ICU.  GU- foley in situ   Data Reviewed: I have personally reviewed following labs and imaging studies  CBC: Recent Labs  Lab 03/24/2020 2245  03/17/2020 2245 03/27/20 0508 03/27/20 0508 03/28/20 0113 03/28/20 0428 03/28/20 1900 03/29/20 0514 03/30/20 0347  WBC 6.0   < > 9.9  --  5.8 6.4  --  8.8 8.7  NEUTROABS 4.9  --  4.9  --   --  5.8  --  7.9* 7.6  HGB 9.7*   < > 8.7*   < > 6.2* 6.5* 8.2* 8.0* 9.1*  HCT 30.2*   < > 26.6*   < > 19.2* 20.5* 25.2* 24.6* 28.4*  MCV 98.4   < > 95.7  --  98.0 99.0  --  94.3 93.7  PLT 119*   < > 106*  --  58* 58*  --  40* 46*   < > = values in this interval not displayed.    Basic Metabolic Panel: Recent Labs  Lab 03/17/2020 2245 03/27/20 0508 03/28/20 0428 03/29/20 0514 03/30/20 0347  NA 135 133* 137 140 142  K 4.3 4.4 4.8 3.3* 2.8*  CL 106 103 107 106 107  CO2 20* 20* 19* 19* 22  GLUCOSE 91 96 115* 101* 126*  BUN 38* 43* 61* 66* 67*  CREATININE 2.27* 2.41* 3.21* 3.09* 2.74*  CALCIUM 7.4* 8.0* 8.2* 8.4* 9.0  MG 2.1 1.8 1.9 2.0 1.9  PHOS 6.7*  --  6.7*  --   --    GFR: Estimated Creatinine Clearance: 42 mL/min (A) (by C-G formula based on SCr of 2.74 mg/dL (H)).  Liver Function Tests: Recent Labs  Lab 03/20/2020 2245 03/27/20 0508 03/28/20 0428 03/29/20 0514 03/30/20 0347  AST 3,983* 4,809* 892* 240* 72*  ALT 2,470* 2,826* 1,303* 886* 611*  ALKPHOS 56 51 58 89 123  BILITOT 0.9 1.4* 1.5* 1.2 1.4*  PROT 3.8* 4.4* 4.8* 4.7* 4.7*  ALBUMIN 2.0* 2.8* 3.1* 2.7* 2.5*   CBG: Recent Labs  Lab 03/29/20 1944 03/30/20 0031 03/30/20 0333 03/30/20 0807 03/30/20 1138  GLUCAP 105* 124* 119* 122* 121*    Recent Results (from the past 240 hour(s))  Respiratory Panel by RT PCR (Flu A&B, Covid) - Nasopharyngeal Swab     Status: None   Collection Time: 02/28/2020  7:56 AM   Specimen: Nasopharyngeal Swab  Result Value Ref Range Status   SARS Coronavirus 2 by RT PCR NEGATIVE NEGATIVE Final    Comment: (NOTE) SARS-CoV-2 target nucleic acids are NOT DETECTED.  The SARS-CoV-2 RNA is generally detectable in upper respiratoy specimens during the acute phase of infection. The  lowest concentration of SARS-CoV-2 viral copies this assay can detect is 131 copies/mL. A negative result does not preclude SARS-Cov-2 infection and should not be used as the sole basis for treatment or other patient management decisions. A negative result may occur with  improper specimen collection/handling, submission of specimen other than nasopharyngeal swab, presence of viral mutation(s) within the areas targeted by this assay, and inadequate number of viral copies (<131 copies/mL). A negative result must be combined with clinical observations, patient history, and epidemiological information.  The expected result is Negative.  Fact Sheet for Patients:  PinkCheek.be  Fact Sheet for Healthcare Providers:  GravelBags.it  This test is no t yet approved or cleared by the [place] FDA and  has been authorized for detection and/or diagnosis of SARS-CoV-2 by FDA under an Emergency Use Authorization (EUA). This EUA will remain  in effect (meaning this test can be used) for the duration of the COVID-19 declaration under Section 564(b)(1) of the Act, 21 U.S.C. section 360bbb-3(b)(1), unless the authorization is terminated or revoked sooner.     Influenza A by PCR NEGATIVE NEGATIVE Final   Influenza B by PCR NEGATIVE NEGATIVE Final    Comment: (NOTE) The Xpert Xpress SARS-CoV-2/FLU/RSV assay is intended as an aid in  the diagnosis of influenza from Nasopharyngeal swab specimens and  should not be used as a sole basis for treatment. Nasal washings and  aspirates are unacceptable for Xpert Xpress SARS-CoV-2/FLU/RSV  testing.  Fact Sheet for Patients: PinkCheek.be  Fact Sheet for Healthcare Providers: GravelBags.it  This test is not yet approved or cleared by the [place] FDA and  has been authorized for detection and/or diagnosis of SARS-CoV-2 by  FDA under  an Emergency Use Authorization (EUA). This EUA will remain  in effect (meaning this test can be used) for the duration of the  Covid-19 declaration under Section 564(b)(1) of the Act, 21  U.S.C. section 360bbb-3(b)(1), unless the authorization is  terminated or revoked. Performed at [facility], [address], [place], [place] [address]   MRSA PCR Screening     Status: None   Collection Time: 03/08/2020 10:33 AM   Specimen: Nasal Mucosa; Nasopharyngeal  Result Value Ref Range Status   MRSA by PCR NEGATIVE NEGATIVE Final    Comment:        The GeneXpert MRSA Assay (FDA approved for NASAL specimens only), is one component of a comprehensive MRSA colonization surveillance program. It is not intended to diagnose MRSA infection nor to guide or monitor treatment for MRSA infections. Performed at [facility], [address], [place], [place] [address]   Aerobic/Anaerobic Culture (surgical/deep wound)     Status: None (Preliminary result)   Collection Time: 03/04/2020  7:30 PM   Specimen: Abdomen; Wound  Result Value Ref Range Status   Specimen Description   Final    ABDOMEN Performed at [facility], [address], [place], [place] [address]    Special Requests   Final    NONE Performed at [facility], [address] [place], [place] [address]    Gram Stain   Final    RARE WBC PRESENT,BOTH PMN AND MONONUCLEAR FEW YEAST Performed at [facility], [place] [address] [place],  [address]    Culture   Final    FEW CANDIDA GLABRATA FEW CANDIDA ALBICANS NO ANAEROBES ISOLATED; CULTURE IN PROGRESS FOR 5 DAYS    Report Status PENDING  Incomplete    Radiology Studies: No results found. Scheduled Meds: . sodium chloride   Intravenous Once  . chlorhexidine gluconate (MEDLINE KIT)  15 mL Mouth Rinse BID  . Chlorhexidine Gluconate Cloth  6 each Topical Daily  . docusate  100 mg Per Tube BID  . feeding supplement (PROSource TF)  90 mL Per Tube TID  . fluticasone  2 spray Each  Nare Daily  . hydrocortisone sodium succinate  25 mg Intravenous Q12H  . insulin aspart  0-9 Units Subcutaneous Q4H  . levalbuterol  0.63 mg Nebulization TID  .  mouth rinse  15 mL Mouth Rinse 10 times per day  . pantoprazole (PROTONIX) IV  40 mg Intravenous Q24H  . polyethylene glycol  17 g Per Tube Daily  . venlafaxine  37.5 mg Per Tube BID   Continuous Infusions: . anidulafungin Stopped (03/30/20 1601)  . ceFEPime (MAXIPIME) IV Stopped (03/30/20 0539)  . feeding supplement (VITAL AF 1.2 CAL) 1,000 mL (03/30/20 1445)  . fentaNYL infusion INTRAVENOUS 100 mcg/hr (03/30/20 1604)  . metronidazole Stopped (03/30/20 1448)  . norepinephrine (LEVOPHED) Adult infusion Stopped (03/27/20 2257)    LOS: 5 days   Roxan Hockey, MD [facility]   03/30/2020, 4:05 PM

## 2020-03-30 NOTE — Progress Notes (Signed)
Patients temperature reading 102.6 orally. Tylenol given per tube and placed ice packs around patient and a cold washcloth behind his neck. Patient is having similar unprovoked jerking, non-purposeful movements as last night. Heart rate is now sustaining in the 130-140 range sinus tach.  Dr. Marisa Severin and Dr. Lovell Sheehan notified and I was given orders for urine and blood cultures and CBC. No orders for heart rate at this time. Will continue to monitor.

## 2020-03-30 NOTE — Progress Notes (Signed)
4 Days Post-Op  Subjective: Patient intubated and sedated.  Objective: Vital signs in last 24 hours: Temp:  [97.6 F (36.4 C)-99.4 F (37.4 C)] 99.4 F (37.4 C) (11/04 0830) Pulse Rate:  [87-116] 116 (11/04 1100) Resp:  [12-23] 18 (11/04 1100) BP: (97-147)/(61-87) 100/70 (11/04 1100) SpO2:  [92 %-98 %] 97 % (11/04 1100) FiO2 (%):  [40 %] 40 % (11/04 0829) Weight:  [108.5 kg] 108.5 kg (11/04 0500) Last BM Date: 03/24/20 (immature ostomy formed 10/31)  Intake/Output from previous day: 11/03 0701 - 11/04 0700 In: 2473.9 [I.V.:1127.6; NG/GT:330.2; IV Piggyback:1016.1] Out: 3850 [Urine:3650; Emesis/NG output:200] Intake/Output this shift: Total I/O In: -  Out: 350 [Urine:350]  GI: Soft, incision healing well.  Colostomy pale but patent.  Is having some stool production.  Lab Results:  Recent Labs    03/29/20 0514 03/30/20 0347  WBC 8.8 8.7  HGB 8.0* 9.1*  HCT 24.6* 28.4*  PLT 40* 46*   BMET Recent Labs    03/29/20 0514 03/30/20 0347  NA 140 142  K 3.3* 2.8*  CL 106 107  CO2 19* 22  GLUCOSE 101* 126*  BUN 66* 67*  CREATININE 3.09* 2.74*  CALCIUM 8.4* 9.0   PT/INR Recent Labs    03/29/20 0514 03/30/20 0347  LABPROT 22.4* 26.6*  INR 2.1* 2.5*    Studies/Results: DG Chest Port 1V same Day  Result Date: 03/28/2020 CLINICAL DATA:  Encounter for orogastric tube placement. EXAM: PORTABLE CHEST 1 VIEW COMPARISON:  Chest radiograph performed earlier the same day 03/28/2020. FINDINGS: Problem-oriented examination for enteric tube placement. The upper chest and lung apices are excluded from the field of view. Interval placement of an enteric tube which passes below level left hemidiaphragm with tip projecting in the region of the stomach. Otherwise, no significant change in the visualized thorax as compared to the chest radiograph performed earlier today. IMPRESSION: Interval placement of an enteric tube with tip projecting in the expected location of the stomach.  Electronically Signed   By: Jackey Loge DO   On: 03/28/2020 11:33    Anti-infectives: Anti-infectives (From admission, onward)   Start     Dose/Rate Route Frequency Ordered Stop   03/29/20 1300  anidulafungin (ERAXIS) 100 mg in sodium chloride 0.9 % 100 mL IVPB       "Followed by" Linked Group Details   100 mg 78 mL/hr over 100 Minutes Intravenous Every 24 hours 03/28/20 1211     03/28/20 1300  anidulafungin (ERAXIS) 200 mg in sodium chloride 0.9 % 200 mL IVPB       "Followed by" Linked Group Details   200 mg 78 mL/hr over 200 Minutes Intravenous  Once 03/28/20 1211 03/28/20 1739   03/27/20 1800  ceFEPIme (MAXIPIME) 2 g in sodium chloride 0.9 % 100 mL IVPB        2 g 200 mL/hr over 30 Minutes Intravenous Every 12 hours 03/27/20 0756     03/27/20 0500  ceFEPIme (MAXIPIME) 2 g in sodium chloride 0.9 % 100 mL IVPB  Status:  Discontinued        2 g 200 mL/hr over 30 Minutes Intravenous Every 8 hours 03/21/2020 2124 03/27/20 0756   03/06/2020 2200  ceFEPIme (MAXIPIME) 2 g in sodium chloride 0.9 % 100 mL IVPB  Status:  Discontinued        2 g 200 mL/hr over 30 Minutes Intravenous  Once 03/10/2020 2100 02/27/2020 2124   02/26/2020 2200  metroNIDAZOLE (FLAGYL) IVPB 500 mg  500 mg 100 mL/hr over 60 Minutes Intravenous Every 8 hours 02/29/2020 2100     03/15/2020 2130  ceFEPIme (MAXIPIME) 2 g in sodium chloride 0.9 % 100 mL IVPB        2 g 200 mL/hr over 30 Minutes Intravenous  Once 03/13/2020 2124 03/27/20 0048   03/07/2020 1624  cefoTEtan (CEFOTAN) 2 g in sodium chloride 0.9 % 100 mL IVPB        2 g 200 mL/hr over 30 Minutes Intravenous 30 min pre-op 02/29/2020 1615 03/16/2020 1750   02/25/2020 1100  doxycycline (VIBRAMYCIN) 100 mg in sodium chloride 0.9 % 250 mL IVPB  Status:  Discontinued        100 mg 125 mL/hr over 120 Minutes Intravenous Every 12 hours 03/19/2020 0937 03/24/2020 1630   03/02/2020 1515  doxycycline (VIBRA-TABS) tablet 100 mg  Status:  Discontinued        100 mg Oral Every 12 hours 03/22/2020  1509 02/27/2020 0937      Assessment/Plan: s/p Procedure(s): EXPLORATORY LAPAROTOMY, PARTICAL COLECTOMY COLOSTOMY Impression: Postoperative day four, status post Hartman's procedure.  He has been started on tube feeds and he is tolerating them well.  May continue to get to goal rate of 50 cc an hour.  Nonoliguric renal failure is resolving.  LFTs are resolving.  Weaning off vent as per critical care.  Continue antibiotic and antifungal.  Discussed with Dr. Mariea Clonts.  LOS: 5 days    Franky Macho 03/30/2020

## 2020-03-30 NOTE — Progress Notes (Signed)
Assisted tele visit to patient with family member.  Waino Mounsey R, RN  

## 2020-03-30 NOTE — Progress Notes (Signed)
Patient seen having upper body jerking, tense, and non-purposful movement without any stimulation provoking actions. The tense movement last 1-2sec at a frequency of every 10-16min or about 6 occurences in a hour. [facility]  MD S. Sommer notified, no new orders given.

## 2020-03-31 ENCOUNTER — Other Ambulatory Visit (HOSPITAL_COMMUNITY): Payer: Medicare Other

## 2020-03-31 ENCOUNTER — Inpatient Hospital Stay (HOSPITAL_COMMUNITY): Payer: Medicare Other

## 2020-03-31 ENCOUNTER — Encounter (HOSPITAL_COMMUNITY): Payer: Medicare Other

## 2020-03-31 DIAGNOSIS — K658 Other peritonitis: Secondary | ICD-10-CM | POA: Diagnosis present

## 2020-03-31 DIAGNOSIS — J9621 Acute and chronic respiratory failure with hypoxia: Secondary | ICD-10-CM | POA: Diagnosis not present

## 2020-03-31 DIAGNOSIS — Z978 Presence of other specified devices: Secondary | ICD-10-CM | POA: Diagnosis not present

## 2020-03-31 DIAGNOSIS — R6521 Severe sepsis with septic shock: Secondary | ICD-10-CM | POA: Diagnosis not present

## 2020-03-31 DIAGNOSIS — A419 Sepsis, unspecified organism: Secondary | ICD-10-CM | POA: Diagnosis not present

## 2020-03-31 DIAGNOSIS — J441 Chronic obstructive pulmonary disease with (acute) exacerbation: Secondary | ICD-10-CM | POA: Diagnosis not present

## 2020-03-31 LAB — BLOOD GAS, ARTERIAL
Acid-base deficit: 10 mmol/L — ABNORMAL HIGH (ref 0.0–2.0)
Acid-base deficit: 15.4 mmol/L — ABNORMAL HIGH (ref 0.0–2.0)
Bicarbonate: 16.3 mmol/L — ABNORMAL LOW (ref 20.0–28.0)
Bicarbonate: 12.7 mmol/L — ABNORMAL LOW (ref 20.0–28.0)
Drawn by: 38235
FIO2: 40
FIO2: 40
MECHVT: 620 mL
O2 Saturation: 94.7 %
O2 Saturation: 92 %
PEEP: 5 cmH2O
Patient temperature: 37
Patient temperature: 39.3
RATE: 20 resp/min
pCO2 arterial: 37.6 mmHg (ref 32.0–48.0)
pCO2 arterial: 34.1 mmHg (ref 32.0–48.0)
pH, Arterial: 7.25 — ABNORMAL LOW (ref 7.350–7.450)
pH, Arterial: 7.16 — CL (ref 7.350–7.450)
pO2, Arterial: 86 mmHg (ref 83.0–108.0)
pO2, Arterial: 88.9 mmHg (ref 83.0–108.0)

## 2020-03-31 LAB — URINE CULTURE
Culture: NO GROWTH
Special Requests: NORMAL

## 2020-03-31 LAB — GLUCOSE, CAPILLARY: Glucose-Capillary: 78 mg/dL (ref 70–99)

## 2020-03-31 MED ORDER — VASOPRESSIN 20 UNITS/100 ML INFUSION FOR SHOCK
0.0000 [IU]/min | INTRAVENOUS | Status: DC
  Administered 2020-03-31: 0.03 [IU]/min via INTRAVENOUS
  Filled 2020-03-31: qty 100

## 2020-03-31 MED ORDER — SODIUM CHLORIDE 0.9 % IV BOLUS
1000.0000 mL | Freq: Once | INTRAVENOUS | Status: AC
  Administered 2020-03-31: 1000 mL via INTRAVENOUS

## 2020-03-31 MED ORDER — IOHEXOL 9 MG/ML PO SOLN
ORAL | Status: AC
  Administered 2020-03-31: 500 mL
  Filled 2020-03-31: qty 1000

## 2020-03-31 MED ORDER — SODIUM BICARBONATE 8.4 % IV SOLN
INTRAVENOUS | Status: DC
  Filled 2020-03-31 (×12): qty 100

## 2020-03-31 MED ORDER — SODIUM CHLORIDE 0.9 % IV SOLN
400.0000 mg | Freq: Once | INTRAVENOUS | Status: AC
  Administered 2020-03-31: 400 mg via INTRAVENOUS
  Filled 2020-03-31: qty 4

## 2020-04-01 LAB — AEROBIC/ANAEROBIC CULTURE W GRAM STAIN (SURGICAL/DEEP WOUND)

## 2020-04-04 ENCOUNTER — Encounter (HOSPITAL_COMMUNITY): Admission: RE | Payer: Self-pay | Source: Home / Self Care

## 2020-04-04 ENCOUNTER — Ambulatory Visit (HOSPITAL_COMMUNITY): Admission: RE | Admit: 2020-04-04 | Payer: Medicare Other | Source: Home / Self Care

## 2020-04-04 LAB — CULTURE, BLOOD (ROUTINE X 2)
Culture: NO GROWTH
Special Requests: ADEQUATE

## 2020-04-04 SURGERY — COLONOSCOPY WITH PROPOFOL
Anesthesia: Monitor Anesthesia Care

## 2020-04-04 MED FILL — Medication: Qty: 1 | Status: AC

## 2020-04-05 LAB — BLOOD CULTURE ID PANEL (REFLEXED) - BCID2

## 2020-04-06 LAB — CULTURE, BLOOD (ROUTINE X 2): Special Requests: ADEQUATE

## 2020-04-26 NOTE — Progress Notes (Signed)
eLink Physician-Brief Progress Note Patient Name: Manuel Richard DOB: 09-27-1970 MRN: [id number]   Date of Service  2020/04/28  HPI/Events of Note  Nursing request for ABG in AM. Patient intubated and ventilated.   eICU Interventions  Will order ABG at 5 AM.     Intervention Category Major Interventions: Respiratory failure - evaluation and management  Zarielle Cea Eugene April 28, 2020, 2:00 AM

## 2020-04-26 NOTE — Progress Notes (Signed)
CRITICAL VALUE ALERT  Critical Value:  Ph 7.160  Date & Time Notied:  04-07-20, 0800 Provider Notified: Dr. Sherene Sires  Orders Received/Actions taken: Awaiting orders.

## 2020-04-26 NOTE — Progress Notes (Signed)
First bottle of contrast given via og tube at 0735. Will give second bottle in one hour at 0835.

## 2020-04-26 NOTE — Progress Notes (Signed)
eLink Physician-Brief Progress Note Patient Name: Manuel Richard DOB: December 18, 1970 MRN: [id number]   Date of Service  04/11/2020  HPI/Events of Note  ABG on 40%/PRVC 20/TV 620/P 5 = 7.25/37.6/86.0  eICU Interventions  Plan: 1. Increase PRVC rate to 30. 2. Repeat ABG at 7:30 AM.     Intervention Category Major Interventions: Respiratory failure - evaluation and management;Acid-Base disturbance - evaluation and management  Manuel Richard 03/30/2020, 5:22 AM

## 2020-04-26 NOTE — Progress Notes (Signed)
Upon Nephrology MD coming in to assess the patient, patient went into VT, and then into V-Fib, code blue was called at 0930. All appropriate team members were present, including attending MD and Nephrologist at bedside. Medications, pulse checks, and compressions were performed for 17 mins all while medications were still infusing through central line. Pt exhibited PEA throughout the pulse checks. Last pulse check was obtained with asystole and code was called at 0947.  A pause and debriefing was held in the room by Coralee North with all code team members present.   IV, Salma Walrond cathter, central line, OG and ETT removed, WNL. Family visited the patient. CDS contacted via attending RN, a potential tissue donor. Post- mortem care performed, patient to be carried to the morgue via RN and NT.

## 2020-04-26 NOTE — Progress Notes (Signed)
NAME:  Manuel Richard, MRN:  [id number], DOB:  30-May-1970, LOS: 6 ADMISSION DATE:  03/03/2020, CONSULTATION DATE: 03/27/20 (emergency basis as discharged from pulmonary clinic for beligerent behavior)  REFERRING MD:  Wynetta Emery, [facility] , CHIEF COMPLAINT:  GI sepsis, resp failure   Brief History   73 yowm former smoker with extensive pmhx admit 10/30 with apparent aecopd then developed abd pain 10/31 > dx Perf viscus   >>> returned on vent  ex-lap for perforated sigmoid c/b feculent peritonitis and is now s/p sigmoid resection and ostomy creation with circulatory shock, met acidosis and decreased uop on vent for resp failure and PCCM service asked to consult 11/1   History of present illness   From epic notes:  49 y.o. male with medical history significant very severe COPD, longtime smoker with nicotine dependence, chronic pain, diastolic CHF, GERD, hyperlipidemia, supplemental oxygen dependent (2-4L/min), chronic dyspnea who had been followed by pulmonary clinic until he was dismissed from the practice due to belligerent behaviors.  He has been having a difficult time with chronic shortness of breath with acute exacerbations over the past month with multiple emergency department visits.  He was most recently seen in the emergency department on 10/16 where he was treated for an acute exacerbation of COPD and sent home.  He returned to the emergency department today with symptoms of progressive shortness of breath difficulty speaking and poor exercise tolerance.  He has been having cough and shortness of breath with yellowish sputum production.  He also reports chronic leg edema which is mostly unchanged.  He says that his shortness of breath woke him up in the middle of the night.  He is not lying flat recumbent.  His main complaint is the shortness of breath.  He is breathing very fast.  He is adamant that he stopped smoking 1 month ago.  He has a 31+ year history of smoking up to 2 packs a day.  ED  Course: The patient was tachycardic on arrival with a heart rate in the 120 range.  Patient was tachypneic with a respiratory rate of 21.  Temperature 98.6.  Pulse ox 99% on 3 L nasal cannula.  Blood pressure 111/90.  Patient was noted to have hypokalemia with a potassium of 2.8.  BUN 25 creatinine 0.60 calcium 8.5 albumin 2.9 magnesium 1.5 total protein 6.0 AST 14 ALT 25.  Cardiac BNP 73.0.  WBC 18.7.  Hemoglobin 11.2.  Platelet count 224.  Influenza a and B testing negative SARS 2 coronavirus test negative.  Chest x-ray no acute findings.  CT angio chest nondiagnostic beyond main pulmonary arteries due to poor quality study.  EKG with findings of sinus tachycardia.  Patient was given continuous nebulizer treatments.  Patient was given IV magnesium and potassium.  The patient was monitored in the emergency department for several hours however continued to have tachypnea and tachycardia.  He continues to have shortness of breath symptoms.  Admission was requested for further evaluation and management COPD exacerbation acute on chronic respiratory failure with hypoxia.   Past Medical History    Anemia, CHF (congestive heart failure) (HCC), Chronic back pain, Chronic pain syndrome, Chronic pain syndrome, Chronic sinusitis, COPD (chronic obstructive pulmonary disease) (no pfts in [facility])  Coronary artery disease, Dyslipidemia, Dysphagia, Epididymitis, Erectile dysfunction, Fatigue, GERD (gastroesophageal reflux disease), Groin pain, High cholesterol, Hydrocele, Lumbar radiculopathy, Nicotine addiction, Peripheral vascular disease ([place]), Rectal bleeding, Shortness of breath dyspnea, Stroke ([place]) (2006), and Testicular pain.   Significant Hospital Events  OR pm 10/31: feculent peritonitis/ sigmoid perf  Off pressors by am 11/2   Consults:  Gen surgery  10/31 >>> PCCM   11/1 >>> Renal 11/2  >>>  Procedures:  R IJ  10/31     >>> Oral et 10/31 >>> L RA  art line 10/31  Out    Significant Diagnostic  Tests:  CT abd 11/5   Micro Data:  COVID 19  10/31 PCR neg MRSA  10/31   PCR  Neg  Abd wound 10/31 >>> few candida  BC x 2  11/4 >>> UC   11/4  >>>  Antimicrobials:  Doxy 10/30 only Cefepime 10/31 >>> Flagyl  10/31 >>> Anidulafungin 11/2 >>>     Scheduled Meds: . sodium chloride   Intravenous Once  . chlorhexidine gluconate (MEDLINE KIT)  15 mL Mouth Rinse BID  . Chlorhexidine Gluconate Cloth  6 each Topical Daily  . docusate  100 mg Per Tube BID  . feeding supplement (PROSource TF)  90 mL Per Tube TID  . fluticasone  2 spray Each Nare Daily  . hydrocortisone sodium succinate  25 mg Intravenous Q12H  . insulin aspart  0-9 Units Subcutaneous Q4H  . levalbuterol  0.63 mg Nebulization TID  . mouth rinse  15 mL Mouth Rinse 10 times per day  . pantoprazole (PROTONIX) IV  40 mg Intravenous Q24H  . polyethylene glycol  17 g Per Tube Daily  . venlafaxine  37.5 mg Per Tube BID   Continuous Infusions: . anidulafungin Stopped (03/30/20 1601)  . ceFEPime (MAXIPIME) IV 2 g (2020/04/13 0553)  . feeding supplement (VITAL AF 1.2 CAL) Stopped (04-13-20 0732)  . fentaNYL infusion INTRAVENOUS 100 mcg/hr (04-13-2020 0823)  . metronidazole 500 mg (13-Apr-2020 0654)  . norepinephrine (LEVOPHED) Adult infusion 2 mcg/min (04-13-20 0232)  . vasopressin     PRN Meds:.acetaminophen, albuterol, fentaNYL (SUBLIMAZE) injection, fentaNYL (SUBLIMAZE) injection, midazolam, nicotine, ondansetron **OR** ondansetron (ZOFRAN) IV   Interim history/subjective:  Spiked new fever pm 11/4 and became acidotic, hypotension with low cvp despite increasing autopeep p vent adjusted up for acidosis    Objective   Blood pressure (!) 67/39, pulse (!) 149, temperature (!) 103.7 F (39.8 C), temperature source Axillary, resp. rate (!) 30, height 6' (1.829 m), weight 108.5 kg, SpO2 95 %. CVP:  [7 mmHg-14 mmHg] 10 mmHg  Vent Mode: PRVC FiO2 (%):  [40 %] 40 % Set Rate:  [20 bmp-30 bmp] 30 bmp Vt Set:  [620 mL] 620  mL PEEP:  [5 cmH20] 5 cmH20 Plateau Pressure:  [14 cmH20-17 cmH20] 16 cmH20   Intake/Output Summary (Last 24 hours) at April 13, 2020 0827 Last data filed at 04/13/20 6256 Gross per 24 hour  Intake 2894.33 ml  Output 975 ml  Net 1919.33 ml   Filed Weights   03/02/2020 1000 03/27/20 0600 03/30/20 0500  Weight: 103.2 kg 110.7 kg 108.5 kg   CVP:  [7 mmHg-14 mmHg] 10 mmHg   Examination: Tmax  103.7 Pt sedated on fent drip No jvd Oropharynx et/ og  Neck supple Lungs with a few scattered exp > insp rhonchi bilaterally RRR no s3 or or sign murmur Abd obese / more disteded today , limited excursion  Extr cool with anasarca developing      I personally reviewed images and agree with radiology impression as follows:  CXR:   Portable 11/4 pm Increasing left-sided pleural effusion when compared with the prior exam. The remainder of the exam is stable  Resolved Hospital Problem list      Assessment & Plan:  1)  GI sepsis secondary to perf sig with feculent peritonitis - still  no evidence of abd compartment syndrome  - recurrence 11/4 pm c/w abd source though can't exclude HCAP / already covered well x for MRSA which seems unlikely  >>> agree with plans for abd ct   2) Acute vent dep resp failure  - h/o copd  Much worse air trapping but severe metabolic acidosis driving his resp drive over the backup rate  >>>>  rx aggressively with bicarb and try to reduce rate on vent   3) Oliguric AKI secondary to 1)     Lab Results  Component Value Date   CREATININE 2.74 (H) 03/30/2020   CREATININE 3.09 (H) 03/29/2020   CREATININE 3.21 (H) 03/28/2020  uop way down c/w relative hypovolemia     4) thrombocytopenia in pt with sepsis and underlying cirrhosis Lab Results  Component Value Date   PLT 62 (L) 03/30/2020   PLT 46 (L) 03/30/2020   PLT 40 (L) 03/29/2020   - no evidence dic clinically so 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 Early dic vs  liver failure Lab Results  Component Value Date   INR 2.5 (H) 03/30/2020   INR 2.1 (H) 03/29/2020   INR 2.0 (H) 03/28/2020   >> rx  2 units FFP 11/1, no active bleeding so holding  off   6) post op anemia? Acute blood loss (nothing obvious ongoing)   Lab Results  Component Value Date   HGB 13.5 03/30/2020   HGB 9.1 (L) 03/30/2020   HGB 8.0 (L) 03/29/2020  threshold to trx = less than 7 or less than 8 with recurrent pressor dep esp if cvp on low side    7) shock liver in pt with cirrhosis by hx - lfts improving thru 11/4  8) met acidosis s AG  >>> much worse  Am 11/5 > rx bicarb drip  Best practice:  Diet: npo/ back to gravity drain pending CT Pain/Anxiety/Delirium protocol (if indicated): post op sedation  VAP protocol (if indicated):  DVT prophylaxis: PAS GI prophylaxis: PPI Glucose control: per [facility]  Mobility: SBR Code Status: full code for now  Family Communication: per gen surgery/ [facility]  Disposition: ICU  Labs   CBC: Recent Labs  Lab 03/27/20 0508 03/27/20 0508 03/28/20 0113 03/28/20 0113 03/28/20 0428 03/28/20 1900 03/29/20 0514 03/30/20 0347 03/30/20 2054  WBC 9.9   < > 5.8  --  6.4  --  8.8 8.7 19.7*  NEUTROABS 4.9  --   --   --  5.8  --  7.9* 7.6 17.3*  HGB 8.7*   < > 6.2*   < > 6.5* 8.2* 8.0* 9.1* 13.5  HCT 26.6*   < > 19.2*   < > 20.5* 25.2* 24.6* 28.4* 42.6  MCV 95.7   < > 98.0  --  99.0  --  94.3 93.7 94.9  PLT 106*   < > 58*  --  58*  --  40* 46* 62*   < > = values in this interval not displayed.    Basic Metabolic Panel: Recent Labs  Lab 03/22/2020 2245 03/27/20 0508 03/28/20 0428 03/29/20 0514 03/30/20 0347  NA 135 133* 137 140 142  K 4.3 4.4 4.8 3.3* 2.8*  CL 106 103 107 106 107  CO2 20* 20* 19* 19* 22  GLUCOSE 91 96 115* 101* 126*  BUN 38* 43* 61* 66* 67*  CREATININE 2.27* 2.41* 3.21* 3.09* 2.74*  CALCIUM 7.4* 8.0* 8.2* 8.4* 9.0  MG 2.1 1.8 1.9 2.0 1.9  PHOS 6.7*  --  6.7*  --   --    GFR: Estimated Creatinine Clearance: 42  mL/min (A) (by C-G formula based on SCr of 2.74 mg/dL (H)). Recent Labs  Lab 03/06/2020 2245 03/27/20 0225 03/27/20 0508 03/28/20 0113 03/28/20 0428 03/29/20 0514 03/30/20 0347 03/30/20 2054  WBC   < >  --  9.9   < > 6.4 8.8 8.7 19.7*  LATICACIDVEN  --  2.3* 2.0*  --   --   --   --   --    < > = values in this interval not displayed.    Liver Function Tests: Recent Labs  Lab 03/07/2020 2245 03/27/20 0508 03/28/20 0428 03/29/20 0514 03/30/20 0347  AST 3,983* 4,809* 892* 240* 72*  ALT 2,470* 2,826* 1,303* 886* 611*  ALKPHOS 56 51 58 89 123  BILITOT 0.9 1.4* 1.5* 1.2 1.4*  PROT 3.8* 4.4* 4.8* 4.7* 4.7*  ALBUMIN 2.0* 2.8* 3.1* 2.7* 2.5*   No results for input(s): LIPASE, AMYLASE in the last 168 hours. No results for input(s): AMMONIA in the last 168 hours.  ABG    Component Value Date/Time   PHART 7.160 (LL) 04-26-20 0800   PCO2ART 34.1 2020-04-26 0800   PO2ART 88.9 April 26, 2020 0800   HCO3 12.7 (L) 2020/04/26 0800   ACIDBASEDEF 15.4 (H) 04-26-20 0800   O2SAT 92.0 04/26/20 0800     Coagulation Profile: Recent Labs  Lab 03/02/2020 2245 03/27/20 1106 03/28/20 0730 03/29/20 0514 03/30/20 0347  INR 3.2* 2.7* 2.0* 2.1* 2.5*    Cardiac Enzymes: No results for input(s): CKTOTAL, CKMB, CKMBINDEX, TROPONINI in the last 168 hours.  HbA1C: Hgb A1c MFr Bld  Date/Time Value Ref Range Status  03/19/2020 07:25 AM 6.0 (H) 4.8 - 5.6 % Final    Comment:    (NOTE) Pre diabetes:          5.7%-6.4%  Diabetes:              >6.4%  Glycemic control for   <7.0% adults with diabetes     CBG: Recent Labs  Lab 03/30/20 1138 03/30/20 1625 03/30/20 1940 03/30/20 2312 April 26, 2020 0801  GLUCAP 121* 107* 98 111* 78     The patient is critically ill with multiple organ systems failure and requires high complexity decision making for assessment and support, frequent evaluation and titration of therapies, application of advanced monitoring technologies and extensive  interpretation of multiple databases. Critical Care Time devoted to patient care services described in this note is 45 minutes.    Christinia Gully, MD Pulmonary and [place] [phone]   After 7:00 pm call [facility]  [phone]

## 2020-04-26 NOTE — Progress Notes (Addendum)
Code blue called at 0930, pt expired at 0947.MD was at bedside and made aware. Full note to follow.

## 2020-04-26 NOTE — Progress Notes (Addendum)
[facility] Progress Note    Assessment/ Plan:   Assessment/Plan: 49 year old WM with severe COPD and pulm HTN-  Presented with what was thought to be COPD exacerbation-  Developed sepsis- found to have perforated sigmoid colon s/p operative intervention with post op hypotension and AKI  1.Renal- AKI in the setting of sepsis/s/p operative intervention for perforated sigmoid colon.  Likely ATN.  His hemodynamics seem to be improved with UOP , now off pressors.  Cr coming down.  No indication for RRT at present, continue supportive care.    2. Hypertension/volume  - has been volume resuscitated with good results, CVP adequate and off pressor. Stopped IVFs, anticipate post- ATN diuresis given robust UOP so will hold Lasix today.   3. Perforated viscous- s/o resection and ostomy, on broad spectrum IV antibiotics and antifungals 4. Metabolic acidosis-  Due to #1- s/p bicarb, resolved 5. VDRF-  Per CCM-  Has baseline bad lung and heart pathology- likely will make a difficult vent wean 6. Anemia/ thrombocytopenia: supportive care per primary 7. Coagulopathy: inr 2.5 this AM 8.  Dispo: remains in ICU  Subjective:    Hemodynamic deterioration overnight, back on levophed and vaso.  I walked in to do my exam this AM at around 9:29 am, noticed a rhythm change on his monitor, and saw it deteriorate to Vtach/ vfib.  I started compressions and staff called the code.  Dr. Denton Brick called to bedside.  Code ran for 17 min with compressions, 4 epi, 1 bicarb, 1 atropine.  Each pulse check revealed PEA/ not shockable rhythm.  Pt did not regain pulse and expired at 9:47 am after maximal efforts.      Objective:   BP (!) 99/49   Pulse (!) 149   Temp (!) 103.9 F (39.9 C) (Rectal)   Resp (!) 28   Ht 6' (1.829 m)   Wt 108.5 kg   SpO2 96%   BMI 32.44 kg/m   Intake/Output Summary (Last 24 hours) at 04-26-20 1025 Last data filed at 26-Apr-2020 0830 Gross per 24 hour  Intake 2894.33 ml  Output  900 ml  Net 1994.33 ml   Weight change:   Physical Exam: Gen: intubated, sedated CVS: RRR Resp: coarse bilaterally Abd: soft, midline abd incision c/d/i, colostomy LLQ with bag over it, pink Ext: 2+ LE edema  Imaging: DG Abd 1 View  Result Date: April 26, 2020 CLINICAL DATA:  History of small-bowel obstruction.  Colostomy. EXAM: ABDOMEN - 1 VIEW COMPARISON:  Chest x-ray 03/30/2020. CT 03/25/2020. Abdomen 03/09/2020. FINDINGS: NG tube noted with tip over stomach. Surgical staples are noted over the abdomen. Ostomy noted over the left lower quadrant. No bowel distention noted. No definite free air noted on today's exam. Reference is made to prior CT report. Bibasilar atelectasis. Small left pleural effusion. IMPRESSION: 1. NG tube noted with tip over the stomach. No bowel distention noted. No definite free air noted on today's exam. Reference is made to prior CT report of 03/25/2020. Surgical staples noted over the abdomen. Ostomy noted over the left lower quadrant. 2.  Bibasilar atelectasis.  Small left pleural effusion. Electronically Signed   By: Marcello Moores  Register   On: 2020-04-26 07:03   DG CHEST PORT 1 VIEW  Result Date: 03/30/2020 CLINICAL DATA:  Respiratory distress EXAM: PORTABLE CHEST 1 VIEW COMPARISON:  03/28/2020 FINDINGS: Cardiac shadow is stable. Endotracheal tube and gastric catheter is well as a right jugular central line are again seen and stable. Increasing left-sided pleural effusion is  noted when compare with the prior exam. Small right pleural effusion is noted as well. No pneumothorax is seen. No definitive bony abnormality is seen. IMPRESSION: Increasing left-sided pleural effusion when compared with the prior exam. The remainder of the exam is stable. Electronically Signed   By: Inez Catalina M.D.   On: 03/30/2020 19:32    Labs: BMET Recent Labs  Lab 03/01/2020 0725 03/08/2020 5537 03/09/2020 2245 03/27/20 0508 03/28/20 0428 03/29/20 0514 03/30/20 0347  NA 136 135 135 133*  137 140 142  K 2.8* 3.2* 4.3 4.4 4.8 3.3* 2.8*  CL 98 97* 106 103 107 106 107  CO2 27 22 20* 20* 19* 19* 22  GLUCOSE 137* 264* 91 96 115* 101* 126*  BUN 25* 33* 38* 43* 61* 66* 67*  CREATININE 0.60* 1.12 2.27* 2.41* 3.21* 3.09* 2.74*  CALCIUM 8.5* 9.0 7.4* 8.0* 8.2* 8.4* 9.0  PHOS  --   --  6.7*  --  6.7*  --   --    CBC Recent Labs  Lab 03/28/20 0428 03/28/20 0428 03/28/20 1900 03/29/20 0514 03/30/20 0347 03/30/20 2054  WBC 6.4  --   --  8.8 8.7 19.7*  NEUTROABS 5.8  --   --  7.9* 7.6 17.3*  HGB 6.5*   < > 8.2* 8.0* 9.1* 13.5  HCT 20.5*   < > 25.2* 24.6* 28.4* 42.6  MCV 99.0  --   --  94.3 93.7 94.9  PLT 58*  --   --  40* 46* 62*   < > = values in this interval not displayed.    Medications:    . sodium chloride   Intravenous Once  . chlorhexidine gluconate (MEDLINE KIT)  15 mL Mouth Rinse BID  . Chlorhexidine Gluconate Cloth  6 each Topical Daily  . docusate  100 mg Per Tube BID  . feeding supplement (PROSource TF)  90 mL Per Tube TID  . fluticasone  2 spray Each Nare Daily  . hydrocortisone sodium succinate  25 mg Intravenous Q12H  . insulin aspart  0-9 Units Subcutaneous Q4H  . levalbuterol  0.63 mg Nebulization TID  . mouth rinse  15 mL Mouth Rinse 10 times per day  . pantoprazole (PROTONIX) IV  40 mg Intravenous Q24H  . polyethylene glycol  17 g Per Tube Daily  . venlafaxine  37.5 mg Per Tube BID      Madelon Lips, MD 24-Apr-2020, 10:25 AM

## 2020-04-26 NOTE — Progress Notes (Signed)
Present with Mr Hollister's family, Aggie Cosier (wife) and his daughters, Lou Cal and Morrie Sheldon for emotional and spiritual support after his death.

## 2020-04-26 NOTE — Discharge Summary (Signed)
Manuel Richard JFH:[id number] DOB: 09/28/1970 DOA: 2020-04-07  PCP: Grayland Jack, FNP PCP/Office notified:   Admit date: 04-07-20 Date of Death: 04-13-20 -April 13, 2020 at 0947 am  Final Diagnoses:  Principal Problem:   Fecal peritonitis (HCC) Active Problems:   COPD with acute exacerbation (HCC)   Acute and chronic respiratory failure with hypoxia (HCC)   Perforation of viscus   Severe sepsis with septic shock (HCC)   Drug-seeking behavior   Back pain at L4-L5 level   Dysphagia   Chronic pain syndrome   Dyslipidemia   Vitamin D deficiency   Chronic lower extremity pain (Secondary Area of Pain) (Left)   Long term current use of opiate analgesic   GERD (gastroesophageal reflux disease)   Leukocytosis   Sinus tachycardia   Tachypnea   Pressure injury of skin   Endotracheally intubated  History of present illness:  Chief Complaint: shortness of breath   HPI: Manuel Richard is a 49 y.o. male with medical history significant very severe COPD, longtime smoker with nicotine dependence, chronic pain, diastolic CHF, GERD, hyperlipidemia, supplemental oxygen dependent (2-4L/min), chronic dyspnea who had been followed by pulmonary clinic until he was dismissed from the practice due to belligerent behaviors.  He has been having a difficult time with chronic shortness of breath with acute exacerbations over the past month with multiple emergency department visits.  He was most recently seen in the emergency department on 1016 where he was treated for an acute exacerbation of COPD and sent home.  He returned to the emergency department today with symptoms of progressive shortness of breath difficulty speaking and poor exercise tolerance.  He has been having cough and shortness of breath with yellowish sputum production.  He also reports chronic leg edema which is mostly unchanged.  He says that his shortness of breath woke him up in the middle of the night.  He is not lying flat recumbent.   His main complaint is the shortness of breath.  He is breathing very fast.  He is adamant that he stopped smoking 1 month ago.  He has a 31+ year history of smoking up to 2 packs a day.  ED Course: The patient was tachycardic on arrival with a heart rate in the 120 range.  Patient was tachypneic with a respiratory rate of 21.  Temperature 98.6.  Pulse ox 99% on 3 L nasal cannula.  Blood pressure 111/90.  Patient was noted to have hypokalemia with a potassium of 2.8.  BUN 25 creatinine 0.60 calcium 8.5 albumin 2.9 magnesium 1.5 total protein 6.0 AST 14 ALT 25.  Cardiac BNP 73.0.  WBC 18.7.  Hemoglobin 11.2.  Platelet count 224.  Influenza a and B testing negative SARS 2 coronavirus test negative.  Chest x-ray no acute findings.  CT angio chest nondiagnostic beyond main pulmonary arteries due to poor quality study.  EKG with findings of sinus tachycardia.  Patient was given continuous nebulizer treatments.  Patient was given IV magnesium and potassium.  The patient was monitored in the emergency department for several hours however continued to have tachypnea and tachycardia.  He continues to have shortness of breath symptoms.  Admission was requested for further evaluation and management COPD exacerbation acute on chronic respiratory failure with hypoxia.   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  Bufford Richard upon entry patient's room noticed change in patient's rhythm on the monitor-please note that at this time patient was still on the ventilator on Levophed and vasopressin for pressure support -CODE BLUE was called at 9:30 AM, ACLS protocol was followed, -Pt exhibited PEA throughout the pulse checks. Last pulse check was obtained with asystole and code was called at [phone] -Patient did not have any perfusing rhythm at any point during resuscitative efforts, patient remained pulseless, unresponsive, with no spontaneous breathing or heart tones - Manuel Richard's family including his Manuel Richard (wife) and his daughters, 66 (age 43) and Manuel Richard (age 4) as well as Manuel Richard who lives in [place] were all updated -Chaplain Manuel. Leola Richard was available for prayers  and support  --Please see full progress note dated 03/30/2020 for details of patient's hospital stay  Time of Death : Apr 09, 2020 at 0947 am  Signed:  Acire Tang  [facility] 04-09-2020, 11:44 AM

## 2020-04-26 NOTE — Progress Notes (Signed)
In response to patient's elevated temperature, cooling blanket was placed under patient. During moving the patient, he vomited a moderate amount of brown coffee ground emesis and was suctioned. His ostomy also began to put out dark liquid stool and was emptied. Upon sitting the patient back up in bed his ostomy put out a large amount of liquid stool that seeped from around the bag. Patient was bathed and repositioned and abdominal dressing was changed. Stool continued to leak from around the bag and currently awaiting new colostomy bag from supply.

## 2020-04-26 DEATH — deceased

## 2021-08-29 IMAGING — DX DG CHEST 1V PORT
1 series · 1 of 1 positions shown · non-contrast
Comparison: 05/14/2019

CLINICAL DATA: Shortness of breath

EXAM:
PORTABLE CHEST 1 VIEW

[chest ap]
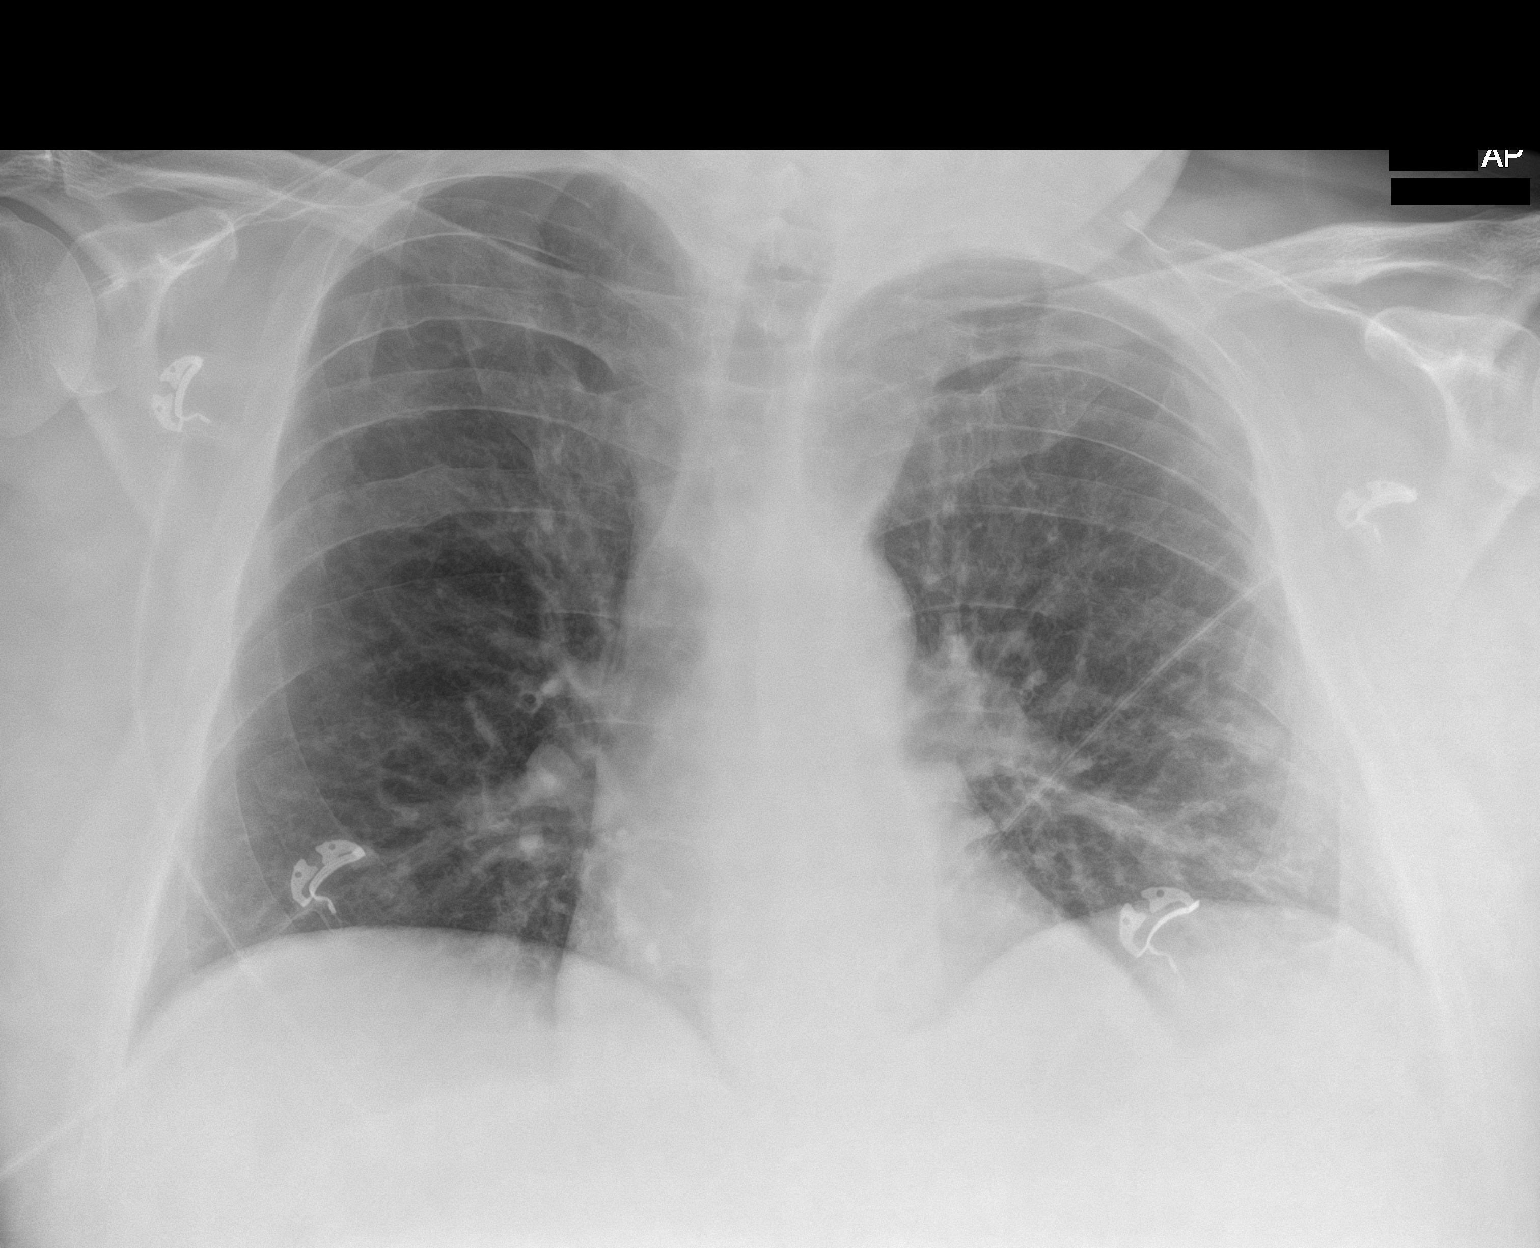

[1 of 1 positions shown; findings below may reference images not displayed]

FINDINGS: Linear subsegmental atelectasis at the left base. Right lung clear.
Heart is normal size. No effusions or acute bony abnormality.
IMPRESSION: Left base atelectasis.

## 2021-09-07 IMAGING — DX DG CHEST 1V PORT
2 series · 2 of 2 positions shown · non-contrast
Comparison: 07/09/2019

CLINICAL DATA: Shortness of breath for 2 days

EXAM:
PORTABLE CHEST 1 VIEW

[chest ap (1 of 2)]
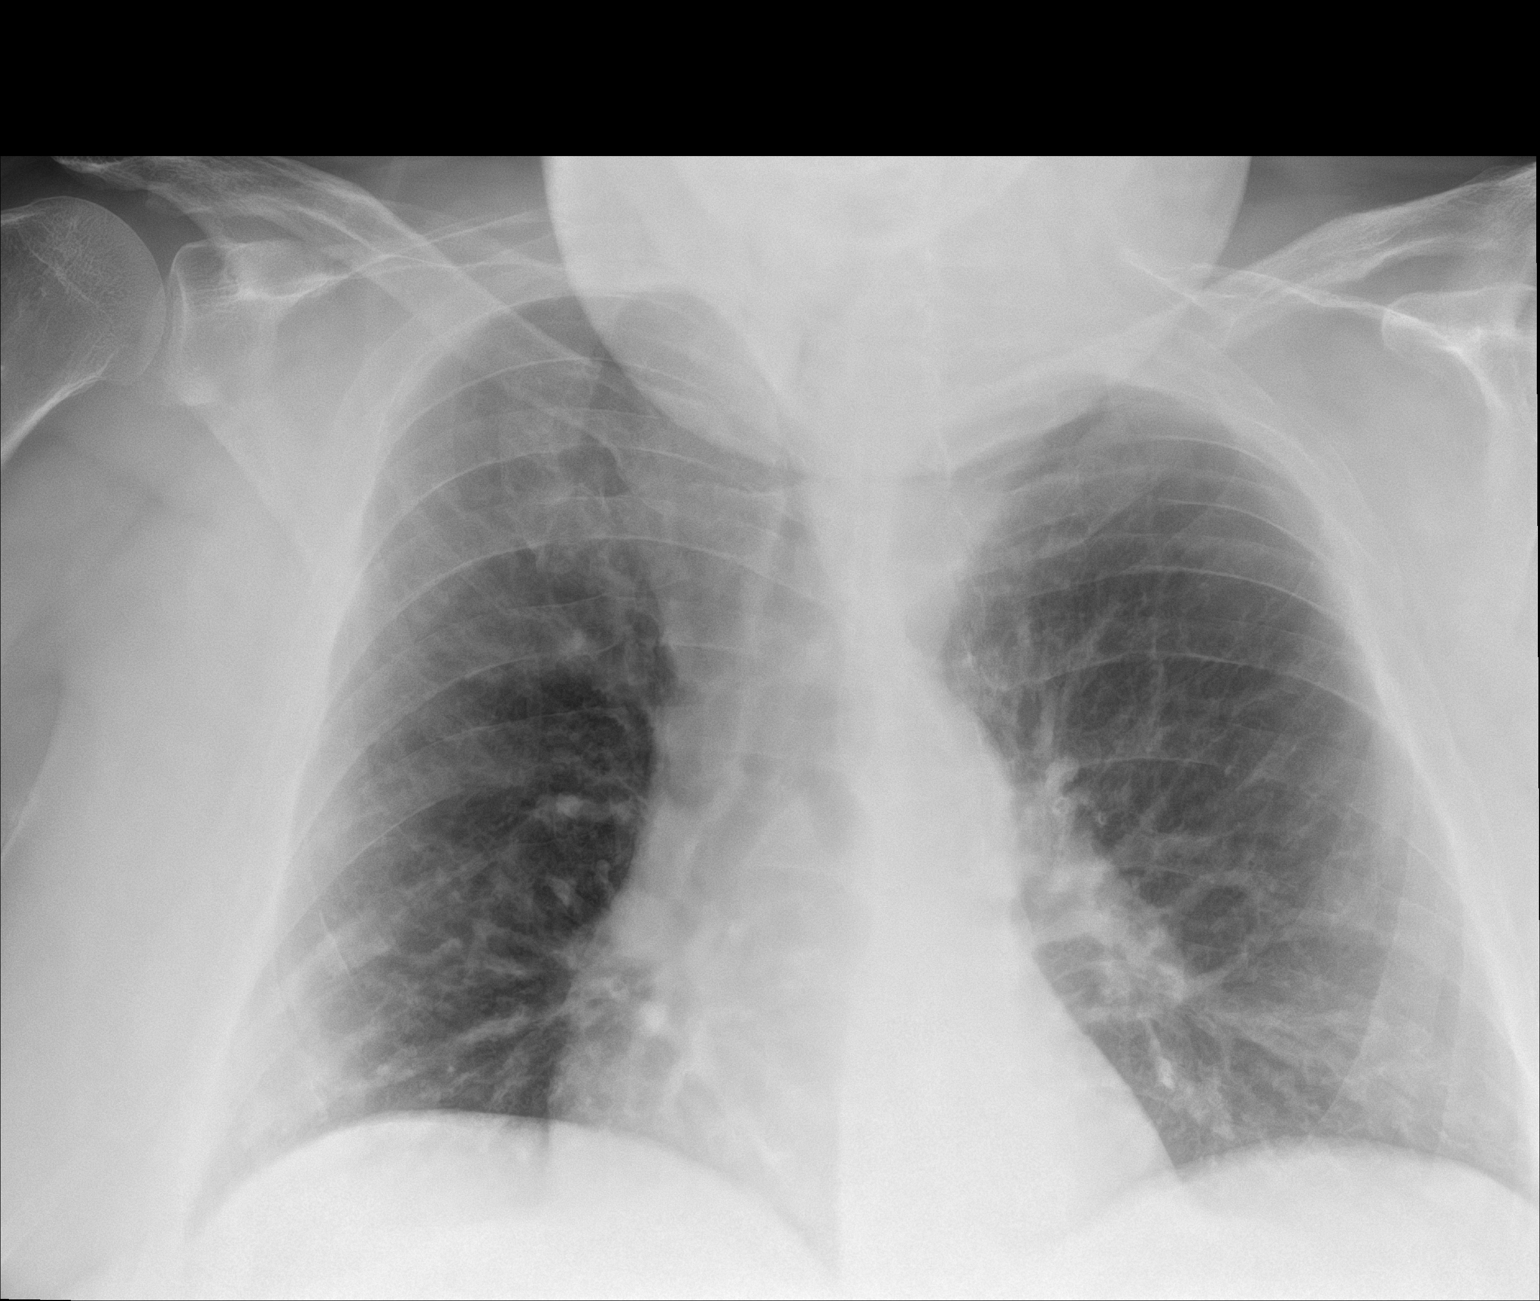

[chest ap (2 of 2)]
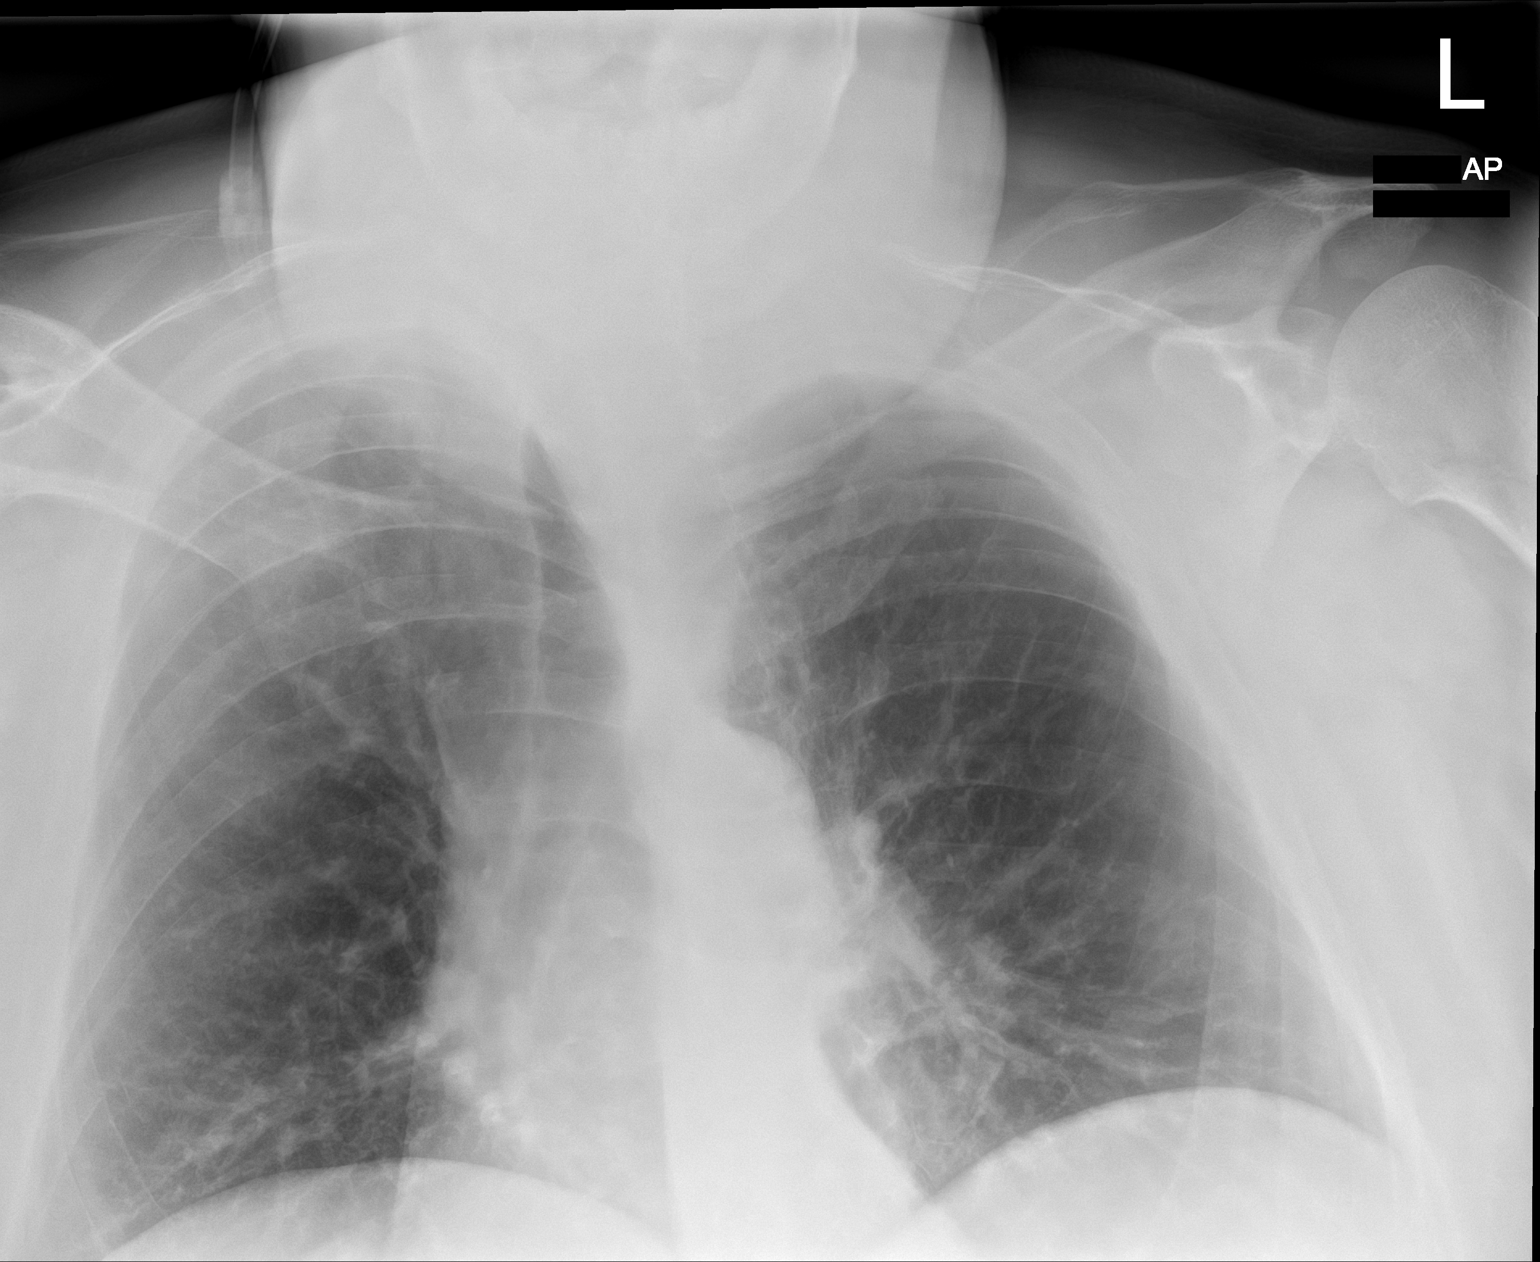

[2 of 2 positions shown; findings below may reference images not displayed]

FINDINGS: 2 frontal views of the chest demonstrate. A There is chronic stable
cardiac silhouette central vascular congestion without airspace
disease, effusion, or pneumothorax. No acute bony abnormalities.
IMPRESSION: 1. Chronic central vascular congestion, no acute process.

## 2022-04-25 IMAGING — DX DG CHEST 1V PORT
1 series · 2 of 2 positions shown · non-contrast
Comparison: Chest radiograph dated 03/03/2020.

CLINICAL DATA: 49-year-old male with shortness of breath.

EXAM:
PORTABLE CHEST 1 VIEW

[Series 2: chest ap grid · 0.14mm/px · 2 of 2 slices shown]
[im 1/2]
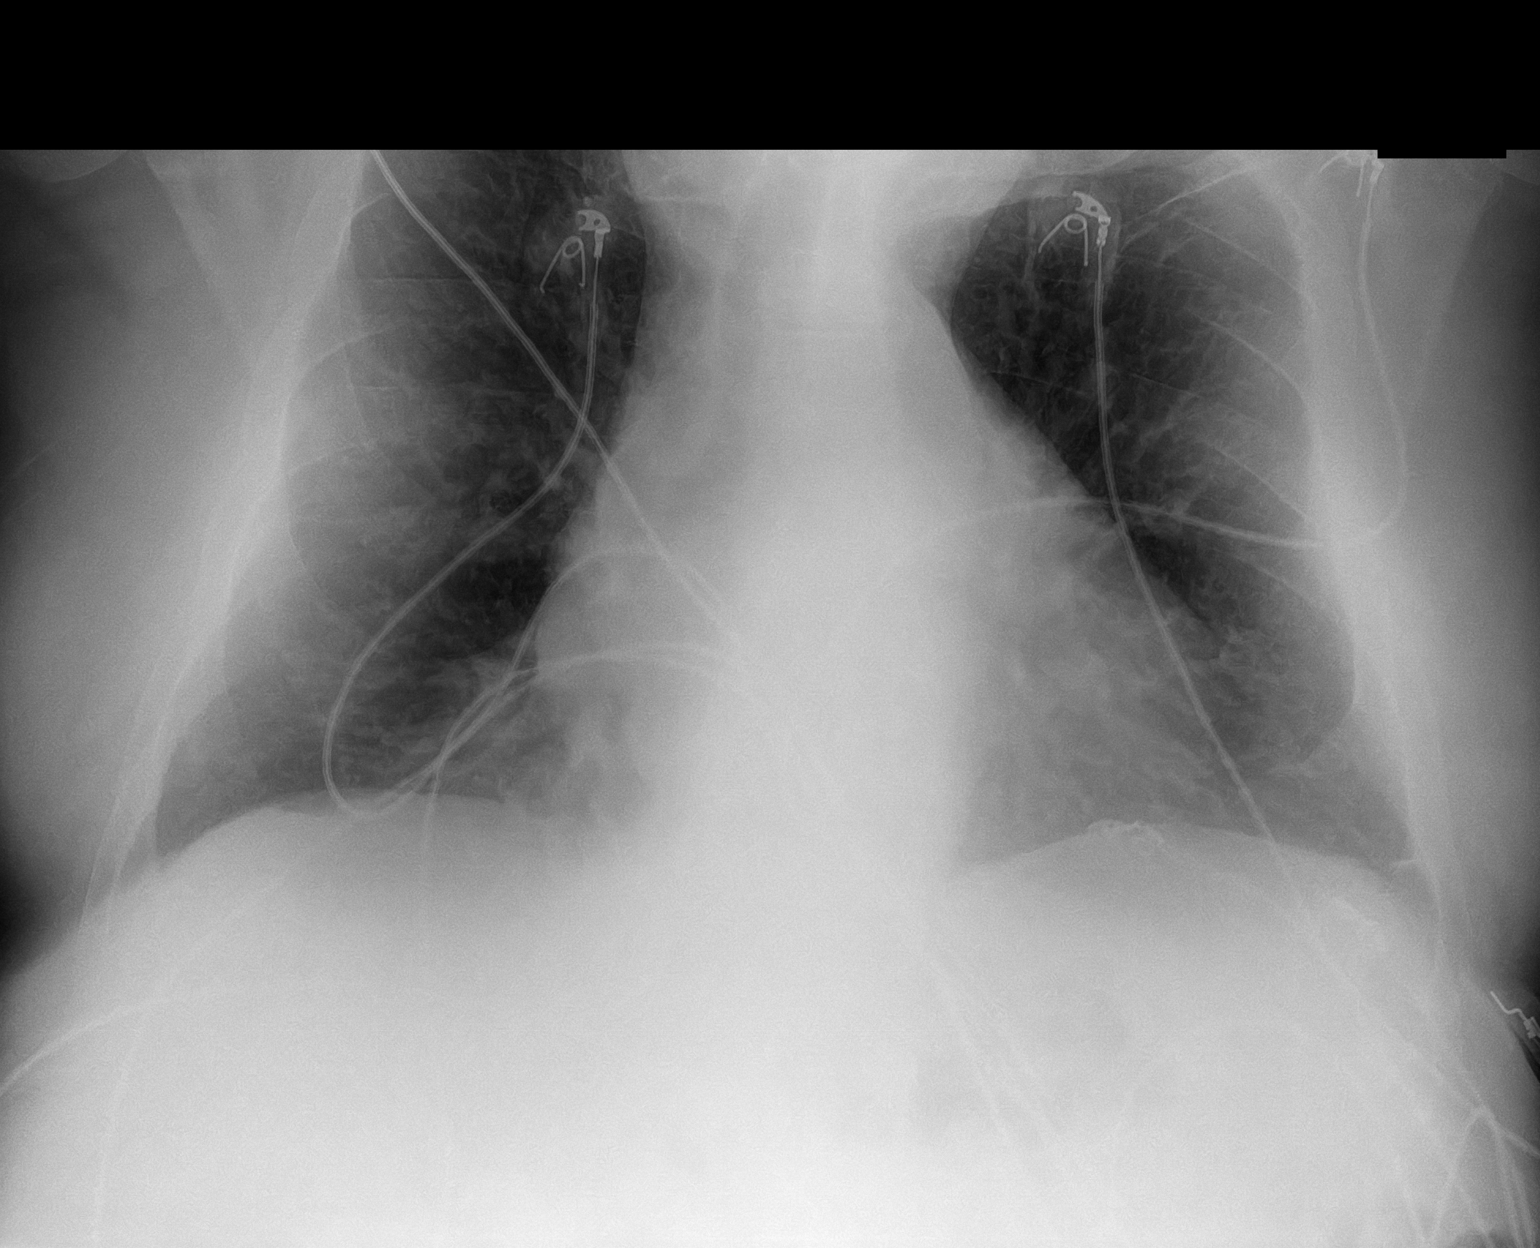
[im 2/2]
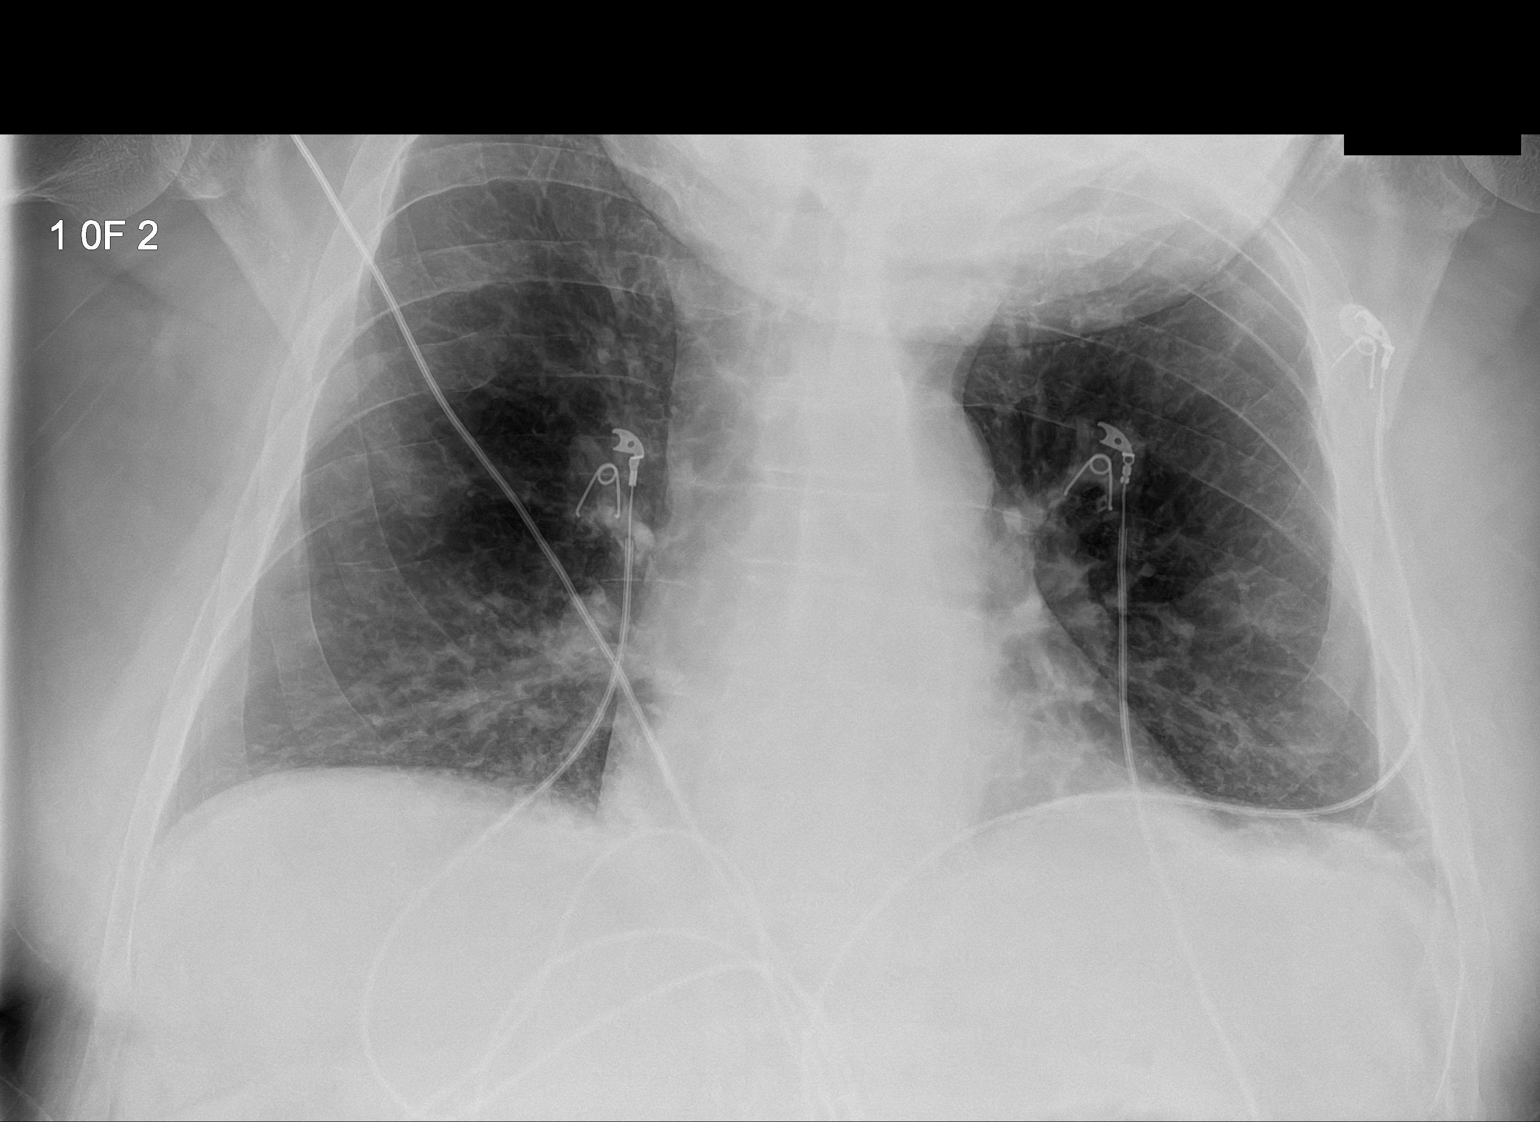

[2 of 2 positions shown; findings below may reference images not displayed]

FINDINGS: Shallow inspiration. No focal consolidation, pleural effusion, or
pneumothorax. The cardiac silhouette is within limits. No acute
osseous pathology.
IMPRESSION: No active disease.
